# Patient Record
Sex: Female | Born: 1960
Health system: Southern US, Community
[De-identification: ages and names within clinical notes are randomized; demographics above are authoritative.]

## PROBLEM LIST (undated history)

## (undated) DIAGNOSIS — Z8669 Personal history of other diseases of the nervous system and sense organs: Secondary | ICD-10-CM

## (undated) DIAGNOSIS — Z8719 Personal history of other diseases of the digestive system: Secondary | ICD-10-CM

## (undated) DIAGNOSIS — Z8659 Personal history of other mental and behavioral disorders: Secondary | ICD-10-CM

## (undated) DIAGNOSIS — Z8701 Personal history of pneumonia (recurrent): Secondary | ICD-10-CM

## (undated) DIAGNOSIS — Z8639 Personal history of other endocrine, nutritional and metabolic disease: Secondary | ICD-10-CM

## (undated) HISTORY — DX: Personal history of other endocrine, nutritional and metabolic disease: Z86.39

## (undated) HISTORY — DX: Personal history of other mental and behavioral disorders: Z86.59

## (undated) HISTORY — DX: Personal history of other diseases of the digestive system: Z87.19

## (undated) HISTORY — PX: BREAST ENHANCEMENT SURGERY: SHX7

## (undated) HISTORY — DX: Personal history of other diseases of the nervous system and sense organs: Z86.69

## (undated) HISTORY — PX: DENTAL SURGERY: SHX609

## (undated) HISTORY — PX: LUMBAR DISC SURGERY: SHX700

## (undated) HISTORY — DX: Personal history of pneumonia (recurrent): Z87.01

---

## 1989-01-11 HISTORY — PX: AUGMENTATION MAMMAPLASTY: SUR837

## 2001-07-05 ENCOUNTER — Encounter: Payer: Self-pay | Admitting: Family Medicine

## 2003-01-12 DIAGNOSIS — Z8701 Personal history of pneumonia (recurrent): Secondary | ICD-10-CM

## 2003-01-12 HISTORY — DX: Personal history of pneumonia (recurrent): Z87.01

## 2003-06-10 ENCOUNTER — Emergency Department (HOSPITAL_COMMUNITY): Admission: EM | Admit: 2003-06-10 | Discharge: 2003-06-10 | Payer: Self-pay | Admitting: Family Medicine

## 2003-11-19 ENCOUNTER — Ambulatory Visit: Payer: Self-pay | Admitting: Family Medicine

## 2003-11-21 ENCOUNTER — Ambulatory Visit: Payer: Self-pay | Admitting: Family Medicine

## 2003-12-11 ENCOUNTER — Ambulatory Visit: Payer: Self-pay | Admitting: Family Medicine

## 2004-03-27 ENCOUNTER — Ambulatory Visit: Payer: Self-pay | Admitting: Family Medicine

## 2004-10-07 ENCOUNTER — Ambulatory Visit: Payer: Self-pay | Admitting: Family Medicine

## 2004-10-19 ENCOUNTER — Ambulatory Visit: Payer: Self-pay | Admitting: Gastroenterology

## 2004-10-20 ENCOUNTER — Ambulatory Visit: Payer: Self-pay | Admitting: Gastroenterology

## 2005-03-11 ENCOUNTER — Encounter (INDEPENDENT_AMBULATORY_CARE_PROVIDER_SITE_OTHER): Payer: Self-pay | Admitting: Internal Medicine

## 2005-03-11 HISTORY — PX: TONGUE SURGERY: SHX810

## 2005-03-11 LAB — CONVERTED CEMR LAB: Hgb A1c MFr Bld: 6.1 %

## 2005-03-18 ENCOUNTER — Emergency Department (HOSPITAL_COMMUNITY): Admission: EM | Admit: 2005-03-18 | Discharge: 2005-03-18 | Payer: Self-pay | Admitting: Family Medicine

## 2005-03-25 ENCOUNTER — Ambulatory Visit: Payer: Self-pay | Admitting: Family Medicine

## 2005-03-29 ENCOUNTER — Ambulatory Visit: Payer: Self-pay | Admitting: Family Medicine

## 2005-04-08 ENCOUNTER — Encounter (INDEPENDENT_AMBULATORY_CARE_PROVIDER_SITE_OTHER): Payer: Self-pay | Admitting: Specialist

## 2005-04-08 ENCOUNTER — Ambulatory Visit (HOSPITAL_BASED_OUTPATIENT_CLINIC_OR_DEPARTMENT_OTHER): Admission: RE | Admit: 2005-04-08 | Discharge: 2005-04-08 | Payer: Self-pay | Admitting: Otolaryngology

## 2005-04-13 ENCOUNTER — Ambulatory Visit: Payer: Self-pay | Admitting: Family Medicine

## 2005-07-12 ENCOUNTER — Ambulatory Visit: Payer: Self-pay | Admitting: Family Medicine

## 2005-10-20 ENCOUNTER — Ambulatory Visit: Payer: Self-pay | Admitting: Family Medicine

## 2006-01-11 ENCOUNTER — Encounter (INDEPENDENT_AMBULATORY_CARE_PROVIDER_SITE_OTHER): Payer: Self-pay | Admitting: Internal Medicine

## 2006-01-19 ENCOUNTER — Ambulatory Visit: Payer: Self-pay | Admitting: Family Medicine

## 2006-01-28 ENCOUNTER — Other Ambulatory Visit: Admission: RE | Admit: 2006-01-28 | Discharge: 2006-01-28 | Payer: Self-pay | Admitting: Family Medicine

## 2006-01-28 ENCOUNTER — Encounter (INDEPENDENT_AMBULATORY_CARE_PROVIDER_SITE_OTHER): Payer: Self-pay | Admitting: Specialist

## 2006-01-28 ENCOUNTER — Ambulatory Visit: Payer: Self-pay | Admitting: Family Medicine

## 2006-02-01 ENCOUNTER — Ambulatory Visit: Payer: Self-pay | Admitting: Family Medicine

## 2006-02-01 LAB — CONVERTED CEMR LAB
ALT: 23 units/L (ref 0–40)
Albumin: 3.6 g/dL (ref 3.5–5.2)
BUN: 12 mg/dL (ref 6–23)
CO2: 27 meq/L (ref 19–32)
Calcium: 8.9 mg/dL (ref 8.4–10.5)
Chloride: 107 meq/L (ref 96–112)
Cholesterol: 150 mg/dL (ref 0–200)
GFR calc Af Amer: 100 mL/min
HCT: 33.7 % — ABNORMAL LOW (ref 36.0–46.0)
MCHC: 34.5 g/dL (ref 30.0–36.0)
Platelets: 306 10*3/uL (ref 150–400)
Potassium: 4 meq/L (ref 3.5–5.1)
RBC: 4.14 M/uL (ref 3.87–5.11)
Total Protein: 6.2 g/dL (ref 6.0–8.3)
Triglycerides: 126 mg/dL (ref 0–149)
WBC: 7.1 10*3/uL (ref 4.5–10.5)

## 2006-02-02 ENCOUNTER — Ambulatory Visit (HOSPITAL_COMMUNITY): Admission: RE | Admit: 2006-02-02 | Discharge: 2006-02-02 | Payer: Self-pay | Admitting: Family Medicine

## 2006-07-06 ENCOUNTER — Encounter: Payer: Self-pay | Admitting: Internal Medicine

## 2006-07-06 DIAGNOSIS — E1165 Type 2 diabetes mellitus with hyperglycemia: Secondary | ICD-10-CM | POA: Insufficient documentation

## 2006-07-06 DIAGNOSIS — E785 Hyperlipidemia, unspecified: Secondary | ICD-10-CM

## 2006-07-06 DIAGNOSIS — K219 Gastro-esophageal reflux disease without esophagitis: Secondary | ICD-10-CM

## 2006-07-06 DIAGNOSIS — F329 Major depressive disorder, single episode, unspecified: Secondary | ICD-10-CM

## 2006-07-06 DIAGNOSIS — R739 Hyperglycemia, unspecified: Secondary | ICD-10-CM

## 2006-07-06 DIAGNOSIS — E1169 Type 2 diabetes mellitus with other specified complication: Secondary | ICD-10-CM | POA: Insufficient documentation

## 2006-07-06 DIAGNOSIS — F32A Depression, unspecified: Secondary | ICD-10-CM | POA: Insufficient documentation

## 2006-07-06 DIAGNOSIS — G43009 Migraine without aura, not intractable, without status migrainosus: Secondary | ICD-10-CM

## 2006-07-14 ENCOUNTER — Ambulatory Visit: Payer: Self-pay | Admitting: Family Medicine

## 2006-07-14 DIAGNOSIS — M545 Low back pain: Secondary | ICD-10-CM

## 2006-07-25 ENCOUNTER — Telehealth (INDEPENDENT_AMBULATORY_CARE_PROVIDER_SITE_OTHER): Payer: Self-pay | Admitting: Internal Medicine

## 2006-11-02 ENCOUNTER — Encounter (INDEPENDENT_AMBULATORY_CARE_PROVIDER_SITE_OTHER): Payer: Self-pay | Admitting: Internal Medicine

## 2006-11-21 ENCOUNTER — Emergency Department (HOSPITAL_COMMUNITY): Admission: EM | Admit: 2006-11-21 | Discharge: 2006-11-21 | Payer: Self-pay | Admitting: Emergency Medicine

## 2006-11-23 ENCOUNTER — Ambulatory Visit (HOSPITAL_COMMUNITY): Admission: RE | Admit: 2006-11-23 | Discharge: 2006-11-23 | Payer: Self-pay | Admitting: *Deleted

## 2006-11-25 ENCOUNTER — Ambulatory Visit (HOSPITAL_COMMUNITY): Admission: RE | Admit: 2006-11-25 | Discharge: 2006-11-25 | Payer: Self-pay | Admitting: *Deleted

## 2006-11-29 ENCOUNTER — Telehealth: Payer: Self-pay | Admitting: Family Medicine

## 2006-11-30 ENCOUNTER — Telehealth: Payer: Self-pay | Admitting: Family Medicine

## 2006-11-30 ENCOUNTER — Encounter: Admission: RE | Admit: 2006-11-30 | Discharge: 2006-11-30 | Payer: Self-pay | Admitting: *Deleted

## 2007-03-10 ENCOUNTER — Encounter (INDEPENDENT_AMBULATORY_CARE_PROVIDER_SITE_OTHER): Payer: Self-pay | Admitting: Internal Medicine

## 2007-04-10 ENCOUNTER — Encounter (INDEPENDENT_AMBULATORY_CARE_PROVIDER_SITE_OTHER): Payer: Self-pay | Admitting: Internal Medicine

## 2007-05-04 ENCOUNTER — Ambulatory Visit: Payer: Self-pay | Admitting: Family Medicine

## 2007-05-04 ENCOUNTER — Encounter (INDEPENDENT_AMBULATORY_CARE_PROVIDER_SITE_OTHER): Payer: Self-pay | Admitting: Internal Medicine

## 2007-05-05 LAB — CONVERTED CEMR LAB
Alkaline Phosphatase: 55 units/L (ref 39–117)
BUN: 13 mg/dL (ref 6–23)
Chloride: 105 meq/L (ref 96–112)
Creatinine, Ser: 0.9 mg/dL (ref 0.4–1.2)
Eosinophils Absolute: 0.2 10*3/uL (ref 0.0–0.7)
Eosinophils Relative: 2.2 % (ref 0.0–5.0)
Glucose, Bld: 80 mg/dL (ref 70–99)
HCT: 38.5 % (ref 36.0–46.0)
Hemoglobin: 12.6 g/dL (ref 12.0–15.0)
Neutro Abs: 4.5 10*3/uL (ref 1.4–7.7)
Platelets: 305 10*3/uL (ref 150–400)
Potassium: 3.9 meq/L (ref 3.5–5.1)
RBC: 4.4 M/uL (ref 3.87–5.11)
Sodium: 142 meq/L (ref 135–145)
Total Protein: 7 g/dL (ref 6.0–8.3)

## 2007-05-09 ENCOUNTER — Ambulatory Visit (HOSPITAL_COMMUNITY): Admission: RE | Admit: 2007-05-09 | Discharge: 2007-05-09 | Payer: Self-pay | Admitting: Internal Medicine

## 2007-05-12 ENCOUNTER — Telehealth (INDEPENDENT_AMBULATORY_CARE_PROVIDER_SITE_OTHER): Payer: Self-pay | Admitting: Internal Medicine

## 2007-05-16 ENCOUNTER — Encounter (INDEPENDENT_AMBULATORY_CARE_PROVIDER_SITE_OTHER): Payer: Self-pay | Admitting: Internal Medicine

## 2007-06-12 ENCOUNTER — Telehealth: Payer: Self-pay | Admitting: Family Medicine

## 2007-06-16 ENCOUNTER — Ambulatory Visit: Payer: Self-pay | Admitting: Family Medicine

## 2007-06-29 ENCOUNTER — Encounter: Payer: Self-pay | Admitting: Family Medicine

## 2007-07-05 ENCOUNTER — Telehealth (INDEPENDENT_AMBULATORY_CARE_PROVIDER_SITE_OTHER): Payer: Self-pay | Admitting: Internal Medicine

## 2007-07-17 ENCOUNTER — Encounter: Admission: RE | Admit: 2007-07-17 | Discharge: 2007-10-03 | Payer: Self-pay | Admitting: *Deleted

## 2007-07-26 ENCOUNTER — Encounter: Payer: Self-pay | Admitting: Family Medicine

## 2007-07-31 ENCOUNTER — Ambulatory Visit (HOSPITAL_COMMUNITY): Admission: RE | Admit: 2007-07-31 | Discharge: 2007-08-01 | Payer: Self-pay | Admitting: *Deleted

## 2007-07-31 ENCOUNTER — Encounter: Payer: Self-pay | Admitting: Family Medicine

## 2007-07-31 HISTORY — PX: LAPAROSCOPIC GASTRIC BANDING: SHX1100

## 2007-08-11 ENCOUNTER — Ambulatory Visit: Payer: Self-pay | Admitting: Family Medicine

## 2007-08-11 DIAGNOSIS — E039 Hypothyroidism, unspecified: Secondary | ICD-10-CM

## 2007-08-17 ENCOUNTER — Ambulatory Visit: Payer: Self-pay | Admitting: Family Medicine

## 2007-08-21 LAB — CONVERTED CEMR LAB
LDL Cholesterol: 106 mg/dL — ABNORMAL HIGH (ref 0–99)
TSH: 1.94 microintl units/mL (ref 0.35–5.50)
Total CHOL/HDL Ratio: 5.4
VLDL: 28 mg/dL (ref 0–40)

## 2007-09-04 ENCOUNTER — Telehealth (INDEPENDENT_AMBULATORY_CARE_PROVIDER_SITE_OTHER): Payer: Self-pay | Admitting: *Deleted

## 2007-09-06 ENCOUNTER — Encounter: Payer: Self-pay | Admitting: Family Medicine

## 2007-09-11 ENCOUNTER — Telehealth (INDEPENDENT_AMBULATORY_CARE_PROVIDER_SITE_OTHER): Payer: Self-pay | Admitting: *Deleted

## 2007-10-10 ENCOUNTER — Telehealth (INDEPENDENT_AMBULATORY_CARE_PROVIDER_SITE_OTHER): Payer: Self-pay | Admitting: *Deleted

## 2007-10-26 ENCOUNTER — Encounter: Admission: RE | Admit: 2007-10-26 | Discharge: 2007-10-26 | Payer: Self-pay | Admitting: *Deleted

## 2007-11-28 ENCOUNTER — Ambulatory Visit: Payer: Self-pay | Admitting: Family Medicine

## 2007-11-28 DIAGNOSIS — E669 Obesity, unspecified: Secondary | ICD-10-CM | POA: Insufficient documentation

## 2007-11-28 DIAGNOSIS — N920 Excessive and frequent menstruation with regular cycle: Secondary | ICD-10-CM

## 2007-12-14 ENCOUNTER — Encounter (INDEPENDENT_AMBULATORY_CARE_PROVIDER_SITE_OTHER): Payer: Self-pay | Admitting: *Deleted

## 2007-12-25 ENCOUNTER — Telehealth: Payer: Self-pay | Admitting: Family Medicine

## 2008-01-01 ENCOUNTER — Telehealth (INDEPENDENT_AMBULATORY_CARE_PROVIDER_SITE_OTHER): Payer: Self-pay | Admitting: *Deleted

## 2008-01-11 ENCOUNTER — Ambulatory Visit: Payer: Self-pay | Admitting: Family Medicine

## 2008-01-19 LAB — CONVERTED CEMR LAB
Cholesterol: 214 mg/dL (ref 0–200)
Direct LDL: 164.3 mg/dL
HDL: 29.2 mg/dL — ABNORMAL LOW (ref >39.0)
TSH: 1.46 microintl units/mL (ref 0.35–5.50)
Total CHOL/HDL Ratio: 7.3
Triglycerides: 74 mg/dL (ref 0–149)
VLDL: 15 mg/dL (ref 0–40)

## 2008-04-25 ENCOUNTER — Telehealth: Payer: Self-pay | Admitting: Family Medicine

## 2008-09-06 ENCOUNTER — Encounter: Payer: Self-pay | Admitting: Family Medicine

## 2008-09-06 ENCOUNTER — Emergency Department: Payer: Self-pay | Admitting: Emergency Medicine

## 2008-09-10 ENCOUNTER — Telehealth: Payer: Self-pay | Admitting: Family Medicine

## 2008-09-12 ENCOUNTER — Telehealth: Payer: Self-pay | Admitting: Family Medicine

## 2008-09-18 ENCOUNTER — Ambulatory Visit: Payer: Self-pay | Admitting: Family Medicine

## 2008-09-18 DIAGNOSIS — N912 Amenorrhea, unspecified: Secondary | ICD-10-CM | POA: Insufficient documentation

## 2008-09-18 DIAGNOSIS — L293 Anogenital pruritus, unspecified: Secondary | ICD-10-CM

## 2008-09-18 DIAGNOSIS — R42 Dizziness and giddiness: Secondary | ICD-10-CM

## 2008-09-23 ENCOUNTER — Telehealth: Payer: Self-pay | Admitting: Family Medicine

## 2009-01-04 ENCOUNTER — Emergency Department: Payer: Self-pay | Admitting: Emergency Medicine

## 2009-02-02 ENCOUNTER — Emergency Department (HOSPITAL_COMMUNITY): Admission: EM | Admit: 2009-02-02 | Discharge: 2009-02-02 | Payer: Self-pay | Admitting: Emergency Medicine

## 2009-02-03 ENCOUNTER — Telehealth: Payer: Self-pay | Admitting: Family Medicine

## 2009-05-26 ENCOUNTER — Telehealth: Payer: Self-pay | Admitting: Family Medicine

## 2010-01-13 ENCOUNTER — Telehealth: Payer: Self-pay | Admitting: Family Medicine

## 2010-02-08 LAB — CONVERTED CEMR LAB
FSH: 47.5 milliintl units/mL
LH: 29.27 milliintl units/mL
TSH: 1.32 microintl units/mL (ref 0.35–5.50)

## 2010-02-10 NOTE — Progress Notes (Signed)
Summary: sumatriptan succ  Phone Note Refill Request Message from:  Fax from Pharmacy on February 03, 2009 9:34 AM  Refills Requested: Medication #1:  IMITREX 100 MG  TABS 1 by mouth once daily as needed for migraine   Supply Requested: 1 month   Last Ref[facility]: [phone]   Method Requested: Electronic Initial call taken by: Benny Lennert CMA Duncan Dull),  February 03, 2009 9:34 AM  Follow-up for Phone Call        px written on EMR for call in  Follow-up by: Judith Part MD,  February 03, 2009 10:27 AM  Additional Follow-up for Phone Call Additional follow up Details #1::        [facility] Additional Follow-up by: Lowella Petties CMA,  February 03, 2009 10:47 AM    Prescriptions: IMITREX 100 MG  TABS (SUMATRIPTAN SUCCINATE) 1 by mouth once daily as needed for migraine  #12 x 1   Entered and Authorized by:   Judith Part MD   Signed by:   Lowella Petties CMA on 02/03/2009   Method used:   Telephoned to ...       [facility] (retail)       [address]       [place], [place]  [address]       Ph: [phone] or [phone]       Fax: [phone]   RxID:   [id number]

## 2010-02-10 NOTE — Progress Notes (Signed)
Summary: Sumatriptan succ 100mg  refill  Phone Note Refill Request Call back at [phone] Message from:  [facility] on May 26, 2009 12:01 PM  Refills Requested: Medication #1:  IMITREX 100 MG  TABS 1 by mouth once daily as needed for migraine [facility] request refill on Sumatriptan succ 100mg  no date sent last refill.Please advise.    Method Requested: Telephone to Pharmacy Initial call taken by: Lewanda Rife LPN,  May 26, 2009 12:02 PM  Follow-up for Phone Call        px written on EMR for call in  Follow-up by: Judith Part MD,  May 26, 2009 12:43 PM  Additional Follow-up for Phone Call Additional follow up Details #1::        Medication [facility] as instructed. Lewanda Rife LPN  May 26, 2009 4:23 PM     New/Updated Medications: IMITREX 100 MG  TABS (SUMATRIPTAN SUCCINATE) 1 by mouth once daily as needed for migraine Prescriptions: IMITREX 100 MG  TABS (SUMATRIPTAN SUCCINATE) 1 by mouth once daily as needed for migraine  #12 x 3   Entered and Authorized by:   Judith Part MD   Signed by:   Lewanda Rife LPN on 11/91/4782   Method used:   Telephoned to ...       [facility] (retail)       [address]       [place], [place]  [address]       Ph: [phone] or [phone]       Fax: [phone]   RxID:   [id number]

## 2010-02-12 NOTE — Progress Notes (Signed)
Summary:   LEXAPRO  Phone Note Refill Request Message from:  [phone] on January 13, 2010 4:43 PM  Refills Requested: Medication #1:  LEXAPRO 20 MG TABS Take 1 tablet by mouth once a day   Last Refilled: 11/11/2009 [facility] Request    Method Requested: Telephone to Pharmacy Initial call taken by: Mervin Hack CMA Duncan Dull),  January 13, 2010 4:45 PM  Follow-up for Phone Call        px written on EMR for call in  Follow-up by: Judith Part MD,  January 13, 2010 4:54 PM  Additional Follow-up for Phone Call Additional follow up Details #1::        [facility] as instructed. Lewanda Rife LPN  January 13, 2010 4:56 PM     New/Updated Medications: LEXAPRO 20 MG TABS (ESCITALOPRAM OXALATE) Take 1 tablet by mouth once a day Prescriptions: LEXAPRO 20 MG TABS (ESCITALOPRAM OXALATE) Take 1 tablet by mouth once a day  #30 x 5   Entered and Authorized by:   Judith Part MD   Signed by:   Lewanda Rife LPN on 84/16/6063   Method used:   Telephoned to ...       [facility] (retail)       [address]       [place], [place]  [address]       Ph: [phone] or [phone]       Fax: [phone]   RxID:   [id number]

## 2010-02-20 ENCOUNTER — Telehealth: Payer: Self-pay | Admitting: Family Medicine

## 2010-02-25 ENCOUNTER — Inpatient Hospital Stay (INDEPENDENT_AMBULATORY_CARE_PROVIDER_SITE_OTHER)
Admission: RE | Admit: 2010-02-25 | Discharge: 2010-02-25 | Disposition: A | Discharge status: Home / Self Care | Payer: Commercial Managed Care - PPO | Source: Ambulatory Visit | Attending: Emergency Medicine | Admitting: Emergency Medicine

## 2010-02-25 DIAGNOSIS — R079 Chest pain, unspecified: Secondary | ICD-10-CM

## 2010-02-26 NOTE — Progress Notes (Signed)
Summary: Sumatriptan Succ 100mg   Phone Note Refill Request Call back at [phone] Message from:  [facility] on February 20, 2010 4:41 PM  Refills Requested: Medication #1:  IMITREX 100 MG  TABS 1 by mouth once daily as needed for migraine   Last Refilled: 12/26/2009 Electronic refill request for Sumatriptan succ 100mg . last filled 12/26/09. Last Office note I could see was09/08/2008.Please advise.    Method Requested: Telephone to Pharmacy Initial call taken by: Lewanda Rife LPN,  February 20, 2010 4:42 PM  Follow-up for Phone Call        need to sched f/u this spring px written on EMR for call in  Follow-up by: Judith Part MD,  February 20, 2010 5:17 PM  Additional Follow-up for Phone Call Additional follow up Details #1::        [facility] as instructed. Note added to rx for pt to call for f/u this spring.Lewanda Rife LPN  February 20, 2010 5:30 PM     Prescriptions: IMITREX 100 MG  TABS (SUMATRIPTAN SUCCINATE) 1 by mouth once daily as needed for migraine  #12 x 3   Entered and Authorized by:   Judith Part MD   Signed by:   Judith Part MD on 02/20/2010   Method used:   Telephoned to ...       [facility] (retail)       [address]       [place], [place]  [address]       Ph: [phone] or [phone]       Fax: [phone]   RxID:   [id number]

## 2010-05-22 ENCOUNTER — Other Ambulatory Visit: Payer: Self-pay | Admitting: *Deleted

## 2010-05-22 MED ORDER — SUMATRIPTAN SUCCINATE 100 MG PO TABS: 100.0000 mg | ORAL_TABLET | ORAL | Status: DC | PRN

## 2010-05-22 NOTE — Telephone Encounter (Signed)
Printed in error. Reordered and sent electronically.

## 2010-05-26 NOTE — Op Note (Signed)
NAMEPIPPA, Evelyn Shepherd                ACCOUNT NO.:  [id number]   MEDICAL RECORD NO.:  [id number]          PATIENT TYPE:  OIB   LOCATION:  1531                         FACILITY:  [facility]   PHYSICIAN:  Alfonse Ras, MD   DATE OF BIRTH:  1960/09/09   DATE OF PROCEDURE:  DATE OF DISCHARGE:                               OPERATIVE REPORT   PREOPERATIVE DIAGNOSIS:  Medically refractory and morbid obesity.   POSTOPERATIVE DIAGNOSIS:  Medically refractory and morbid obesity.   PROCEDURE:  Laparoscopic adjustable gastric banding with the Allergan  APS system.   ANESTHESIA:  General.   SURGEON:  Alfonse Ras, M.D.   ASSISTANTThornton Park. Daphine Deutscher, M.D.   DESCRIPTION:  The patient was taken to the operating room, placed in  supine position after adequate general anesthesia was induced using  endotracheal tube the abdomen was prepped and draped in normal sterile  fashion.  Using 11 mm trocar in the left upper abdomen, direct  peritoneal access was obtained.  Under direct vision a 15 mm and 11 mm  trocars were placed in the right upper quadrant and additional 11-mm  camera port was placed in the left paramedian position.  A 5 mm trocar  was placed in subxiphoid region and Nathanson liver retractor was  introduced.  The left lateral segment of the liver was retracted  anteriorly.  Additional 5-mm port was placed in the right abdomen.  Initially performed a sharp and blunt dissection of the angle of His.  Then turned my attention to the right crus of the diaphragm.  The  peritoneum was incised over it and it was dissected posteriorly.  Both  the right and left crus could be visualized.  The sizing balloon was put  down and insufflated with 15 mL of air and pulled back.  There was no  evidence of hernia.  I did not see a hernia defect.  There was no  dimpling of the anterior peritoneum either.  I then found the area of  striking fat on the right diaphragm and passed the band passer in the  retrogastric position and brought it out at the angle of His.  An APS  system was then introduced and pulled around the stomach and closed and  placed over the sizing balloon. It moved easily.  I then did three  interrupted fundoplication sutures  with 2-0 Ethilon from the stomach to  the proximal pouch.  Adequate hemostasis was assured.  The band was in  good position.  The [facility] liver retractor was removed.  Tubing was  brought out through the 11 mm right sided trocar.  Pneumoperitoneum was  released.  All trocars were removed.  The tubing was then affixed to the  port and the  port to the anterior abdominal fascia, interrupted 2-0 Prolene.  Tissues  were injected with 0.5 Marcaine.  Skin incisions were closed with  staples.  Sterile dressings were applied.  The patient tolerated the  procedure well, went to PACU in good condition.      Alfonse Ras, MD  Electronically Signed  KRE/MEDQ  D:  07/31/2007  T:  08/01/2007  Job:  [id number]   cc:   Marne A. Tower, MD  [address] [place], [place] [address]

## 2010-05-29 NOTE — Op Note (Signed)
NAMEJOSLYNNE, KLATT                ACCOUNT NO.:  [id number]   MEDICAL RECORD NO.:  [id number]          PATIENT TYPE:  AMB   LOCATION:  DSC                          FACILITY:  [facility]   PHYSICIAN:  Suzanna Obey, M.D.       DATE OF BIRTH:  [birth year]   DATE OF PROCEDURE:  04/08/2005  DATE OF DISCHARGE:                                 OPERATIVE REPORT   PREOPERATIVE DIAGNOSIS:  Left tongue lesion.   POSTOPERATIVE DIAGNOSIS:  Left tongue lesion.   OPERATION PERFORMED:  Excision of left tongue mass.   SURGEON:  Suzanna Obey, M.D.   ANESTHESIA:  General.   ESTIMATED BLOOD LOSS:  Less than 5 mL.   INDICATIONS FOR PROCEDURE:  This is a 50 year old who has had repetitive  problems with a lesion on the left side of her tongue.  It has been there  for over two months.  It has caused her pain and discomfort.  It has been an  ulceration area.  She was informed of the risks and benefits of the  procedure including bleeding, infection, scarring, dysarthria, persistent  pain, and dysphagia and risks of the anesthetic.  All questions were  answered and consent was obtained.   DESCRIPTION OF PROCEDURE:  The patient was taken to the operating room and  placed in supine position after adequate general endotracheal tube  anesthesia, was placed in supine position and draped in the usual sterile  manner.  An elliptical incision was made around the left lateral lesion and  the lesion was removed with electrocautery.  It was then closed with  interrupted 3-0 Vicryl.  The wound looked very nice at closure with good  hemostasis.  The patient was then awakened and brought to recovery in stable  condition.  Counts correct.           ______________________________  Suzanna Obey, M.D.     JB/MEDQ  D:  04/08/2005  T:  04/09/2005  Job:  [id number]   cc:   Marne A. Tower, M.D. [facility]  [address] [place]  [place] [address]

## 2010-08-06 ENCOUNTER — Inpatient Hospital Stay (INDEPENDENT_AMBULATORY_CARE_PROVIDER_SITE_OTHER)
Admission: RE | Admit: 2010-08-06 | Discharge: 2010-08-06 | Disposition: A | Discharge status: Home / Self Care | Payer: 59 | Source: Ambulatory Visit | Attending: Family Medicine | Admitting: Family Medicine

## 2010-08-06 DIAGNOSIS — J069 Acute upper respiratory infection, unspecified: Secondary | ICD-10-CM

## 2010-08-06 DIAGNOSIS — N390 Urinary tract infection, site not specified: Secondary | ICD-10-CM

## 2010-08-06 LAB — POCT URINALYSIS DIP (DEVICE)
Bilirubin Urine: NEGATIVE
Ketones, ur: NEGATIVE mg/dL
pH: 6.5 (ref 5.0–8.0)

## 2010-10-09 LAB — CBC
MCV: 87
RBC: 4.24

## 2010-10-09 LAB — DIFFERENTIAL
Basophils Relative: 0
Lymphs Abs: 0.9
Monocytes Relative: 4
Neutro Abs: 7.3
Neutrophils Relative %: 85 — ABNORMAL HIGH

## 2010-10-09 LAB — HEMOGLOBIN AND HEMATOCRIT, BLOOD: HCT: 38.6

## 2010-10-09 LAB — GLUCOSE, CAPILLARY

## 2010-12-25 ENCOUNTER — Other Ambulatory Visit: Payer: Self-pay | Admitting: Family Medicine

## 2010-12-25 NOTE — Telephone Encounter (Signed)
rx called into pharmacy

## 2010-12-25 NOTE — Telephone Encounter (Signed)
Is ok -- has appt with me in march Px written for call in  Since note did not specify what pharmacy

## 2010-12-25 NOTE — Telephone Encounter (Signed)
Patient requesting refill on Lexapro 20mg .

## 2010-12-25 NOTE — Telephone Encounter (Signed)
OK to refill? Not on current med list and patient has not been seen in well over 1 year.

## 2011-02-08 ENCOUNTER — Other Ambulatory Visit: Payer: Self-pay | Admitting: Family Medicine

## 2011-02-11 ENCOUNTER — Telehealth (INDEPENDENT_AMBULATORY_CARE_PROVIDER_SITE_OTHER): Payer: Self-pay | Admitting: Surgery

## 2011-02-11 NOTE — Telephone Encounter (Signed)
02/11/11 recall letter mailed to patient for bariatric surgery follow-up. Adv pt to call our office at [phone] to schedule an appointment. CEF °

## 2011-02-12 ENCOUNTER — Other Ambulatory Visit: Payer: Self-pay | Admitting: Family Medicine

## 2011-02-12 NOTE — Telephone Encounter (Signed)
Will refill electronically  

## 2011-03-25 ENCOUNTER — Telehealth (INDEPENDENT_AMBULATORY_CARE_PROVIDER_SITE_OTHER): Payer: 59 | Admitting: Family Medicine

## 2011-03-25 ENCOUNTER — Telehealth: Payer: Self-pay | Admitting: Family Medicine

## 2011-03-25 ENCOUNTER — Other Ambulatory Visit (INDEPENDENT_AMBULATORY_CARE_PROVIDER_SITE_OTHER): Payer: 59

## 2011-03-25 DIAGNOSIS — E785 Hyperlipidemia, unspecified: Secondary | ICD-10-CM

## 2011-03-25 DIAGNOSIS — E119 Type 2 diabetes mellitus without complications: Secondary | ICD-10-CM

## 2011-03-25 DIAGNOSIS — E039 Hypothyroidism, unspecified: Secondary | ICD-10-CM

## 2011-03-25 DIAGNOSIS — Z0000 Encounter for general adult medical examination without abnormal findings: Secondary | ICD-10-CM | POA: Insufficient documentation

## 2011-03-25 LAB — COMPREHENSIVE METABOLIC PANEL
Albumin: 4.2 g/dL (ref 3.5–5.2)
Alkaline Phosphatase: 70 U/L (ref 39–117)
BUN: 15 mg/dL (ref 6–23)
CO2: 28 mEq/L (ref 19–32)
Calcium: 9.2 mg/dL (ref 8.4–10.5)
GFR: 75.09 mL/min (ref >60.00)
Glucose, Bld: 98 mg/dL (ref 70–99)
Potassium: 4.3 mEq/L (ref 3.5–5.1)

## 2011-03-25 LAB — CBC WITH DIFFERENTIAL/PLATELET
Basophils Relative: 0.6 % (ref 0.0–3.0)
Eosinophils Relative: 2.6 % (ref 0.0–5.0)
HCT: 37.3 % (ref 36.0–46.0)
MCV: 88 fl (ref 78.0–100.0)
Monocytes Absolute: 0.3 10*3/uL (ref 0.1–1.0)
Monocytes Relative: 5.4 % (ref 3.0–12.0)
Neutrophils Relative %: 39.9 % — ABNORMAL LOW (ref 43.0–77.0)
RBC: 4.24 Mil/uL (ref 3.87–5.11)
WBC: 4.9 10*3/uL (ref 4.5–10.5)

## 2011-03-25 LAB — TSH: TSH: 2.54 u[IU]/mL (ref 0.35–5.50)

## 2011-03-25 LAB — LIPID PANEL
Cholesterol: 233 mg/dL — ABNORMAL HIGH (ref 0–200)
VLDL: 20.8 mg/dL (ref 0.0–40.0)

## 2011-03-25 LAB — LDL CHOLESTEROL, DIRECT: Direct LDL: 157.7 mg/dL

## 2011-03-25 NOTE — Telephone Encounter (Signed)
Message copied by Judy Pimple on Thu Mar 25, 2011  7:51 AM ------      Message from: Alvina Chou      Created: Mon Mar 22, 2011 11:06 AM      Regarding: labs for 03-25-11       Patient is scheduled for CPX labs, please order future labs, Thanks , Camelia Eng

## 2011-03-25 NOTE — Telephone Encounter (Signed)
Message copied by Judy Pimple on Thu Mar 25, 2011  7:49 AM ------      Message from: Baldomero Lamy      Created: Mon Mar 22, 2011 11:59 AM      Regarding: Cpx labs Thurs 3/14       Please order  future cpx labs for pt's upcomming lab appt.      Thanks      Rodney Booze

## 2011-03-29 ENCOUNTER — Encounter: Payer: 59 | Admitting: Family Medicine

## 2011-03-30 ENCOUNTER — Encounter: Payer: 59 | Admitting: Family Medicine

## 2011-04-13 ENCOUNTER — Encounter: Payer: Self-pay | Admitting: Internal Medicine

## 2011-04-13 ENCOUNTER — Ambulatory Visit (INDEPENDENT_AMBULATORY_CARE_PROVIDER_SITE_OTHER): Payer: 59 | Admitting: Family Medicine

## 2011-04-13 ENCOUNTER — Encounter: Payer: Self-pay | Admitting: Family Medicine

## 2011-04-13 VITALS — BP 112/70 | HR 73 | Temp 97.8°F | Ht 66.0 in | Wt 190.8 lb

## 2011-04-13 DIAGNOSIS — Z0000 Encounter for general adult medical examination without abnormal findings: Secondary | ICD-10-CM

## 2011-04-13 DIAGNOSIS — R300 Dysuria: Secondary | ICD-10-CM

## 2011-04-13 DIAGNOSIS — Z1239 Encounter for other screening for malignant neoplasm of breast: Secondary | ICD-10-CM | POA: Insufficient documentation

## 2011-04-13 DIAGNOSIS — R7309 Other abnormal glucose: Secondary | ICD-10-CM

## 2011-04-13 DIAGNOSIS — L219 Seborrheic dermatitis, unspecified: Secondary | ICD-10-CM

## 2011-04-13 DIAGNOSIS — E039 Hypothyroidism, unspecified: Secondary | ICD-10-CM

## 2011-04-13 DIAGNOSIS — R739 Hyperglycemia, unspecified: Secondary | ICD-10-CM

## 2011-04-13 DIAGNOSIS — Z1211 Encounter for screening for malignant neoplasm of colon: Secondary | ICD-10-CM | POA: Insufficient documentation

## 2011-04-13 DIAGNOSIS — E785 Hyperlipidemia, unspecified: Secondary | ICD-10-CM

## 2011-04-13 DIAGNOSIS — N390 Urinary tract infection, site not specified: Secondary | ICD-10-CM

## 2011-04-13 DIAGNOSIS — E669 Obesity, unspecified: Secondary | ICD-10-CM

## 2011-04-13 DIAGNOSIS — Z1231 Encounter for screening mammogram for malignant neoplasm of breast: Secondary | ICD-10-CM

## 2011-04-13 LAB — POCT URINALYSIS DIPSTICK
Bilirubin, UA: NEGATIVE
Glucose, UA: NEGATIVE
Ketones, UA: NEGATIVE
Nitrite, UA: POSITIVE

## 2011-04-13 MED ORDER — KETOCONAZOLE 2 % EX CREA: TOPICAL_CREAM | Freq: Every day | CUTANEOUS | Status: DC

## 2011-04-13 MED ORDER — CIPROFLOXACIN HCL 250 MG PO TABS: 250.0000 mg | ORAL_TABLET | Freq: Two times a day (BID) | ORAL | Status: AC

## 2011-04-13 MED ORDER — SUMATRIPTAN SUCCINATE 100 MG PO TABS: 100.0000 mg | ORAL_TABLET | Freq: Every day | ORAL | Status: DC | PRN

## 2011-04-13 MED ORDER — ESCITALOPRAM OXALATE 20 MG PO TABS: 20.0000 mg | ORAL_TABLET | Freq: Every day | ORAL | Status: DC

## 2011-04-13 NOTE — Progress Notes (Signed)
Subjective:    Patient ID: Evelyn Shepherd, female    DOB: [birth year], 51 y.o.   MRN: [id number]  HPI Here for health maintenance exam and to review chronic medical problems   Feels generally   Has a uti  Last one was 2 years ago  Needs a cx   Is very tired and lacking motivation  Also aches a lot - more than she used to , more aches and pains  Especially after she works  ? Menopausal Also trouble sleeping  Does not think she is getting more depressed  lexapro is working   Face is getting really itchy and also eyebrow area  Also peeling skin on her brows -- skin peels , also middle of forehead/ nose and the edge of hairlie    Wt is up 2 lb with bmi of 30  112/70 good bp  Hypothyroid Lab Results  Component Value Date   TSH 2.54 03/25/2011   no symptoms - is feeling stable   Lipids diet controlled Lab Results  Component Value Date   CHOL 233* 03/25/2011   CHOL 214* 01/11/2008   CHOL 165 08/17/2007   Lab Results  Component Value Date   HDL 50.90 03/25/2011   HDL 40.9* 01/11/2008   HDL 30.3* 08/17/2007   Lab Results  Component Value Date   LDLCALC 106* 08/17/2007   LDLCALC 83 02/01/2006   Lab Results  Component Value Date   TRIG 104.0 03/25/2011   TRIG 74 01/11/2008   TRIG 142 08/17/2007   Lab Results  Component Value Date   CHOLHDL 5 03/25/2011   CHOLHDL 7.3 CALC 01/11/2008   CHOLHDL 5.4 CALC 08/17/2007   Lab Results  Component Value Date   LDLDIRECT 157.7 03/25/2011   LDLDIRECT 164.3 01/11/2008   does not pay much attention to cholesterol in diet  Uses low fat cheeses and lots of salads - tries to stay low fat  Does not eat beef Mostly chicken  Very rarely eats fried foods   Exercise -none now at all  Is not motivated to start  Very little energy -stays tired  Does what she has to do   DM - a1c is 5.7- good  Stays away from sweets - but occ eats them   ua pos today- symptoms for 2 months , bad odor in urine and cloudy too  Started after intercourse  Is  drinking lots of water - but needs to drink more  Did quit drinking tea , however   Pap/ gyn exam Has been just over a year ago 1/12 -- saw gyn for heavy bleeding in past  Now no bleeding in past 1-2 years  No abn paps in many years-- over 20  No gyn symptoms   Td- last 5 years ago  Flu shot- got it in the fall   mammo- was 1/12 Needs to schedule - last one was in gyn office  Wants to go to [place]  Self exam- no lumps or changes   Colon screen Is ready to get her first colonoscopy   Mother recently dx with kidney cancer   Patient Active Problem List  Diagnoses  . HYPOTHYROIDISM  . DIABETES MELLITUS, TYPE II  . HYPERLIPIDEMIA  . OBESITY  . OBESITY, MORBID  . DEPRESSION  . COMMON MIGRAINE  . GERD  . AMENORRHEA  . MENORRHAGIA  . VAGINAL PRURITUS  . LOW BACK PAIN, CHRONIC  . VERTIGO  . Routine general medical examination at a health care facility  .  Other screening mammogram  . UTI (lower urinary tract infection)  . Special screening for malignant neoplasms, colon  . Seborrheic dermatitis   No past medical history on file. No past surgical history on file. History  Substance Use Topics  . Smoking status: Never Smoker   . Smokeless tobacco: Not on file  . Alcohol Use: Not on file   Family History  Problem Relation Age of Onset  . Kidney cancer Mother    Allergies  Allergen Reactions  . Meloxicam     REACTION: unspecified  . Sulfonamide Derivatives     REACTION: unspecified   Current Outpatient Prescriptions on File Prior to Visit  Medication Sig Dispense Refill  . escitalopram (LEXAPRO) 20 MG tablet Take 1 tablet (20 mg total) by mouth daily.  30 tablet  11  . SUMAtriptan (IMITREX) 100 MG tablet Take 1 tablet (100 mg total) by mouth daily as needed for migraine.  12 tablet  11        Review of Systems Review of Systems  Constitutional: Negative for fever, appetite change, fatigue and unexpected weight change.  Eyes: Negative for pain and visual  disturbance.  Respiratory: Negative for cough and shortness of breath.   Cardiovascular: Negative for cp or palpitations    Gastrointestinal: Negative for nausea, diarrhea and constipation.  Genitourinary: pos  for urgency and frequency. neg for menstrual bleeding  Skin: Negative for pallor and pos for rash on face with peeling Neurological: Negative for weakness, light-headedness, numbness and headaches.  Hematological: Negative for adenopathy. Does not bruise/bleed easily.  Psychiatric/Behavioral: Negative for dysphoric mood. The patient is not nervous/anxious.          Objective:   Physical Exam  Constitutional: She appears well-developed and well-nourished. No distress.       overwt and well appearing   HENT:  Head: Normocephalic and atraumatic.  Right Ear: External ear normal.  Left Ear: External ear normal.  Nose: Nose normal.  Mouth/Throat: Oropharynx is clear and moist. No oropharyngeal exudate.  Eyes: Conjunctivae and EOM are normal. Pupils are equal, round, and reactive to light. Right eye exhibits no discharge. Left eye exhibits no discharge. No scleral icterus.  Neck: Normal range of motion. Neck supple. No JVD present. Carotid bruit is not present. No thyromegaly present.  Cardiovascular: Normal rate, regular rhythm, normal heart sounds and intact distal pulses.  Exam reveals no gallop.   Pulmonary/Chest: Effort normal and breath sounds normal. No respiratory distress. She has no wheezes.  Abdominal: Soft. Bowel sounds are normal. She exhibits no distension, no abdominal bruit and no mass. There is no tenderness.  Musculoskeletal: She exhibits no edema and no tenderness.       No cva tenderness   Lymphadenopathy:    She has no cervical adenopathy.  Neurological: She is alert. She has normal reflexes. No cranial nerve deficit. She exhibits normal muscle tone. Coordination normal.  Skin: Skin is warm and dry. Rash noted. No erythema. No pallor.       Slight dryness in  eyebrow area and hairline consistent with seb derm  Psychiatric: She has a normal mood and affect.          Assessment & Plan:

## 2011-04-13 NOTE — Patient Instructions (Addendum)
We will refer you for mammogram and colonoscopy at check out  Try to think about an exercise program - like yoga  For uti -take cipro as directed (Update if not starting to improve in a week or if worsening ) and we call when your culture returns  For rash on face - seborrhea - use the cream (ketoconazole) daily to affected areas and update if not improving Avoid red meat/ fried foods/ egg yolks/ fatty breakfast meats/ butter, cheese and high fat dairy/ and shellfish   Schedule fasting lab and follow up in 6 months for cholesterol

## 2011-04-16 LAB — URINE CULTURE: Colony Count: >=100000

## 2011-04-17 NOTE — Assessment & Plan Note (Signed)
Very well controlled with a1c under 6 Enc low glycemic diet and exercise

## 2011-04-17 NOTE — Assessment & Plan Note (Signed)
Ref for first screening colonoscopy  No special risk factors or stool changes

## 2011-04-17 NOTE — Assessment & Plan Note (Signed)
Discussed how this problem influences overall health and the risks it imposes  Reviewed plan for weight loss with lower calorie diet (via better food choices and also portion control or program like weight watchers) and exercise building up to or more than 30 minutes 5 days per week including some aerobic activity    

## 2011-04-17 NOTE — Assessment & Plan Note (Signed)
On face/ mild Disc use of dandruff shampoo 1-2 times weekly for hair and hairline  Will try nizoral cream for aff areas of face and update

## 2011-04-17 NOTE — Assessment & Plan Note (Signed)
theraputic tsh with no clinical changes No change in medication

## 2011-04-17 NOTE — Assessment & Plan Note (Signed)
Reviewed health habits including diet and exercise and skin cancer prevention Also reviewed health mt list, fam hx and immunizations   Wellness labs reviewed  

## 2011-04-17 NOTE — Assessment & Plan Note (Signed)
Uncomplicated with pos ua tx with cipro cx Red flags disc to watch for  Disc fluid intaks -will inc this

## 2011-04-17 NOTE — Assessment & Plan Note (Signed)
Need to work on lowering LDL  Disc goals for lipids and reasons to control them Rev labs with pt Rev low sat fat diet in detail  Will make plans for lifestyle change and re check

## 2011-04-17 NOTE — Assessment & Plan Note (Signed)
Scheduled annual screening mammogram Nl breast exam today  Encouraged monthly self exams   

## 2011-04-30 ENCOUNTER — Telehealth: Payer: Self-pay | Admitting: *Deleted

## 2011-04-30 NOTE — Telephone Encounter (Signed)
No show for previsit. Home phone ? Of out of service.  Sent no show letter

## 2011-05-10 ENCOUNTER — Encounter: Payer: 59 | Admitting: Internal Medicine

## 2011-05-13 ENCOUNTER — Encounter: Payer: 59 | Admitting: Gastroenterology

## 2011-10-13 ENCOUNTER — Encounter: Payer: Self-pay | Admitting: Family Medicine

## 2011-10-15 ENCOUNTER — Ambulatory Visit: Payer: 59 | Admitting: Family Medicine

## 2011-10-22 ENCOUNTER — Encounter: Payer: Self-pay | Admitting: Family Medicine

## 2011-10-22 ENCOUNTER — Ambulatory Visit (INDEPENDENT_AMBULATORY_CARE_PROVIDER_SITE_OTHER): Payer: 59 | Admitting: Family Medicine

## 2011-10-22 VITALS — BP 104/62 | HR 66 | Temp 98.3°F | Ht 66.0 in | Wt 196.8 lb

## 2011-10-22 DIAGNOSIS — E785 Hyperlipidemia, unspecified: Secondary | ICD-10-CM

## 2011-10-22 DIAGNOSIS — Z23 Encounter for immunization: Secondary | ICD-10-CM

## 2011-10-22 DIAGNOSIS — M545 Low back pain: Secondary | ICD-10-CM

## 2011-10-22 LAB — LIPID PANEL: Cholesterol: 239 mg/dL — ABNORMAL HIGH (ref 0–200)

## 2011-10-22 MED ORDER — METAXALONE 800 MG PO TABS: 800.0000 mg | ORAL_TABLET | Freq: Three times a day (TID) | ORAL | Status: DC

## 2011-10-22 NOTE — Assessment & Plan Note (Signed)
Here for re check of chol Disc goals for LDL  Diet is bettter really hoping for improvement  Also enc regular exercise

## 2011-10-22 NOTE — Assessment & Plan Note (Signed)
Intermittent low and mid back muscular spasm Disc starting exercise Skelaxin refilled for use with caution

## 2011-10-22 NOTE — Progress Notes (Signed)
Subjective:    Patient ID: Evelyn Shepherd, female    DOB: 07-05-1960, 51 y.o.   MRN: [id number]  HPI Here for f/u of hyperlipidemia  Is feeling good except some pain in back on and off in between shoulder blades  Needs refil of skelaxin  Has been busy  No n/t or neuro symptoms  No regular exercise    Flu shot today  Wt is up 6 lb with bmi of 31  Lab Results  Component Value Date   CHOL 233* 03/25/2011   HDL 50.90 03/25/2011   LDLCALC 106* 08/17/2007   LDLDIRECT 157.7 03/25/2011   TRIG 104.0 03/25/2011   CHOLHDL 5 03/25/2011    Diet changes- has cut out her ice cream - 1 month since she has had any  Also cutting back mayo -and quit that  No fried foods No red meat  Eats grilled chicken  Is eating a lot of salads and green beans No southern breakfast   fam hx - mother has high chol  Patient Active Problem List  Diagnosis  . HYPOTHYROIDISM  . Hyperglycemia  . HYPERLIPIDEMIA  . OBESITY  . OBESITY, MORBID  . DEPRESSION  . COMMON MIGRAINE  . GERD  . AMENORRHEA  . MENORRHAGIA  . VAGINAL PRURITUS  . LOW BACK PAIN, CHRONIC  . VERTIGO  . Routine general medical examination at a health care facility  . Other screening mammogram  . UTI (lower urinary tract infection)  . Special screening for malignant neoplasms, colon  . Seborrheic dermatitis   Past Medical History  Diagnosis Date  . History of depression   . Diabetes mellitus     Type II, 3/07  . History of gastroesophageal reflux (GERD)   . History of hyperlipidemia   . History of migraine   . History of obesity     lap band surgery  . History of pneumonia 2005   Past Surgical History  Procedure Date  . Cesarean section   . Breast enhancement surgery   . Lumbar disc surgery   . Dental surgery   . Tongue surgery 03/2005    lesion removal  . Laparoscopic gastric banding 07/31/07   History  Substance Use Topics  . Smoking status: Never Smoker   . Smokeless tobacco: Not on file  . Alcohol Use: Yes   occasional   Family History  Problem Relation Age of Onset  . Kidney cancer Mother   . Pneumonia Father     died  . Lung cancer Father   . Depression Father   . Diabetes Mother   . Depression Brother     commited suicide  . Alcohol abuse Brother   . Early menopause Other     family   Allergies  Allergen Reactions  . Meloxicam     REACTION: unspecified  . Sulfonamide Derivatives     REACTION: unspecified   Current Outpatient Prescriptions on File Prior to Visit  Medication Sig Dispense Refill  . escitalopram (LEXAPRO) 20 MG tablet Take 1 tablet (20 mg total) by mouth daily.  30 tablet  11  . ketoconazole (NIZORAL) 2 % cream Apply topically daily. To areas of face that are affected  15 g  2  . SUMAtriptan (IMITREX) 100 MG tablet Take 1 tablet (100 mg total) by mouth daily as needed for migraine.  12 tablet  11      Review of Systems Review of Systems  Constitutional: Negative for fever, appetite change, fatigue and unexpected weight change.  Eyes: Negative for pain and visual disturbance.  Respiratory: Negative for cough and shortness of breath.   Cardiovascular: Negative for cp or palpitations    Gastrointestinal: Negative for nausea, diarrhea and constipation.  Genitourinary: Negative for urgency and frequency.  Skin: Negative for pallor or rash   MSK pos for intermittent low back and mid back pain worse with movement, neg for joint redness or swelling  Neurological: Negative for weakness, light-headedness, numbness and headaches.  Hematological: Negative for adenopathy. Does not bruise/bleed easily.  Psychiatric/Behavioral: Negative for dysphoric mood. The patient is not nervous/anxious.         Objective:   Physical Exam  Constitutional: She appears well-developed and well-nourished. No distress.  HENT:  Head: Normocephalic and atraumatic.  Mouth/Throat: Oropharynx is clear and moist.  Eyes: Conjunctivae normal and EOM are normal. Pupils are equal, round, and  reactive to light. Right eye exhibits no discharge. Left eye exhibits no discharge.  Neck: Normal range of motion. Neck supple. No JVD present. Carotid bruit is not present. No thyromegaly present.  Cardiovascular: Normal rate, regular rhythm, normal heart sounds and intact distal pulses.  Exam reveals no gallop.   Pulmonary/Chest: Effort normal and breath sounds normal. No respiratory distress. She has no wheezes.  Abdominal: Soft. Bowel sounds are normal. She exhibits no distension, no abdominal bruit and no mass. There is no tenderness.  Musculoskeletal: She exhibits no edema.       Thoracic back: She exhibits tenderness and spasm. She exhibits normal range of motion, no bony tenderness and no edema.       Lumbar back: She exhibits normal range of motion, no tenderness, no bony tenderness and no spasm.       Neg slr  Lymphadenopathy:    She has no cervical adenopathy.  Neurological: She is alert. She has normal strength and normal reflexes. She displays no atrophy. No cranial nerve deficit or sensory deficit. She exhibits normal muscle tone. Coordination normal.  Skin: Skin is warm and dry. No rash noted. No erythema. No pallor.  Psychiatric: She has a normal mood and affect.          Assessment & Plan:

## 2011-10-22 NOTE — Patient Instructions (Addendum)
Flu shot today  I will send the skelaxin to your pharmacy Follow up if back pain does not improve  Think about starting exercise regularly  Avoid red meat/ fried foods/ egg yolks/ fatty breakfast meats/ butter, cheese and high fat dairy/ and shellfish

## 2011-10-25 ENCOUNTER — Encounter: Payer: Self-pay | Admitting: Family Medicine

## 2011-11-02 ENCOUNTER — Telehealth: Payer: Self-pay | Admitting: *Deleted

## 2011-11-02 MED ORDER — ATORVASTATIN CALCIUM 10 MG PO TABS: 10.0000 mg | ORAL_TABLET | Freq: Every day | ORAL | Status: DC

## 2011-11-02 NOTE — Telephone Encounter (Signed)
Pt called re a response she had sent via mychart, she has not heard back from her email.  She is agreeable to starting a med for cholesterol, request it sent to [facility].

## 2011-11-02 NOTE — Telephone Encounter (Signed)
Notified pt Rx was sent in, pt wanted to wait till tomorrow to schedule fasting labs because she want to look at schedule at work

## 2011-11-02 NOTE — Telephone Encounter (Signed)
Thanks- tell her I am still figuring out my chart....  I sent px for generic lipitor  sched fasting lab 6 weeks please

## 2011-11-04 ENCOUNTER — Telehealth: Payer: Self-pay

## 2011-11-04 NOTE — Telephone Encounter (Signed)
Pt scheduled fasting labs for 12/17/11

## 2011-11-04 NOTE — Telephone Encounter (Signed)
Pt requested flu shot immunization with Lot # faxed to secure fax [phone]. Pt notified done.

## 2011-11-08 ENCOUNTER — Other Ambulatory Visit: Payer: 59

## 2011-12-17 ENCOUNTER — Telehealth: Payer: Self-pay | Admitting: Family Medicine

## 2011-12-17 ENCOUNTER — Other Ambulatory Visit (INDEPENDENT_AMBULATORY_CARE_PROVIDER_SITE_OTHER): Payer: 59

## 2011-12-17 DIAGNOSIS — E785 Hyperlipidemia, unspecified: Secondary | ICD-10-CM

## 2011-12-17 LAB — LIPID PANEL
Cholesterol: 154 mg/dL (ref 0–200)
HDL: 49 mg/dL (ref >39.00)
LDL Cholesterol: 88 mg/dL (ref 0–99)
Total CHOL/HDL Ratio: 3
Triglycerides: 83 mg/dL (ref 0.0–149.0)

## 2011-12-17 NOTE — Telephone Encounter (Signed)
Message copied by Judy Pimple on Fri Dec 17, 2011  7:45 AM ------      Message from: Alvina Chou      Created: Mon Dec 13, 2011  4:49 PM      Regarding: Lab orders for Friday,12.6.13       Fasting labs, Thanks, Camelia Eng

## 2011-12-20 ENCOUNTER — Encounter: Payer: Self-pay | Admitting: *Deleted

## 2012-01-28 ENCOUNTER — Telehealth (INDEPENDENT_AMBULATORY_CARE_PROVIDER_SITE_OTHER): Payer: Self-pay | Admitting: Surgery

## 2012-01-28 NOTE — Telephone Encounter (Signed)
01/28/12 left message and mailed recall letter for pt to call [phone] and schedule a bariatric surgery follow-up appt with any bariatric surgeon. Dr. Colin Benton did Lap Band sx 07/31/07. ls

## 2012-03-23 ENCOUNTER — Other Ambulatory Visit: Payer: Self-pay | Admitting: *Deleted

## 2012-03-23 MED ORDER — ATORVASTATIN CALCIUM 10 MG PO TABS: 10.0000 mg | ORAL_TABLET | Freq: Every day | ORAL | Status: DC

## 2012-03-24 ENCOUNTER — Other Ambulatory Visit: Payer: Self-pay | Admitting: *Deleted

## 2012-04-20 ENCOUNTER — Other Ambulatory Visit: Payer: Self-pay | Admitting: *Deleted

## 2012-04-20 NOTE — Telephone Encounter (Signed)
Yes, she can have 6 mo of refils, thanks

## 2012-04-20 NOTE — Telephone Encounter (Signed)
Received fax request saying pt xfered her Rx to [facility] and she needs a new Rx sent there, ok to refill?

## 2012-04-21 MED ORDER — SUMATRIPTAN SUCCINATE 100 MG PO TABS: 100.0000 mg | ORAL_TABLET | Freq: Every day | ORAL | Status: DC | PRN

## 2012-04-21 NOTE — Telephone Encounter (Signed)
done

## 2012-04-23 ENCOUNTER — Other Ambulatory Visit: Payer: Self-pay | Admitting: Family Medicine

## 2012-04-24 ENCOUNTER — Other Ambulatory Visit: Payer: Self-pay | Admitting: *Deleted

## 2012-04-24 NOTE — Telephone Encounter (Signed)
Please refil for 6 months-thanks 

## 2012-04-24 NOTE — Telephone Encounter (Signed)
Ok to refill 

## 2012-04-25 NOTE — Telephone Encounter (Signed)
done

## 2012-05-17 ENCOUNTER — Other Ambulatory Visit: Payer: Self-pay

## 2012-05-17 MED ORDER — ESCITALOPRAM OXALATE 20 MG PO TABS: ORAL_TABLET | ORAL | Status: DC

## 2012-05-17 NOTE — Telephone Encounter (Signed)
Pt left v/m requesting refill lexapro for 90 day supply to new pharmacy [facility] outpatient pharmacy. I called to cancel refills at [facility] and pharmacist said had already been transferred to [facility]. Pt advised can give # [phone] refill.spoke with donna at [facility] out[facility] pharmacy and gave # [phone] x [phone].

## 2012-08-16 ENCOUNTER — Other Ambulatory Visit: Payer: Self-pay | Admitting: *Deleted

## 2012-08-16 MED ORDER — KETOCONAZOLE 2 % EX CREA: TOPICAL_CREAM | Freq: Every day | CUTANEOUS | Status: AC

## 2012-08-16 NOTE — Telephone Encounter (Signed)
Please refill that 3 mo supply

## 2012-08-16 NOTE — Telephone Encounter (Signed)
done

## 2012-08-16 NOTE — Telephone Encounter (Signed)
Pt requested 3 month supply (60g)

## 2012-11-16 ENCOUNTER — Other Ambulatory Visit: Payer: Self-pay

## 2012-11-21 ENCOUNTER — Other Ambulatory Visit: Payer: Self-pay | Admitting: Family Medicine

## 2012-11-21 MED ORDER — ESCITALOPRAM OXALATE 20 MG PO TABS: ORAL_TABLET | ORAL | Status: DC

## 2012-11-21 MED ORDER — SUMATRIPTAN SUCCINATE 100 MG PO TABS: 100.0000 mg | ORAL_TABLET | Freq: Every day | ORAL | Status: DC | PRN

## 2012-11-21 NOTE — Telephone Encounter (Signed)
appt scheduled an meds refilled

## 2012-11-21 NOTE — Telephone Encounter (Signed)
Please f/u with me in 3-6 mo and refill meds until then

## 2012-11-21 NOTE — Telephone Encounter (Addendum)
Lexapro Last filled 08/16/1912.  Pt requesting 3 mo supply of Imitrex.

## 2013-02-13 ENCOUNTER — Encounter: Payer: Self-pay | Admitting: Family Medicine

## 2013-02-13 ENCOUNTER — Ambulatory Visit (INDEPENDENT_AMBULATORY_CARE_PROVIDER_SITE_OTHER): Payer: 59 | Admitting: Family Medicine

## 2013-02-13 VITALS — BP 126/62 | HR 75 | Temp 97.5°F | Ht 66.0 in | Wt 174.8 lb

## 2013-02-13 DIAGNOSIS — Z1211 Encounter for screening for malignant neoplasm of colon: Secondary | ICD-10-CM | POA: Insufficient documentation

## 2013-02-13 DIAGNOSIS — E039 Hypothyroidism, unspecified: Secondary | ICD-10-CM

## 2013-02-13 DIAGNOSIS — R252 Cramp and spasm: Secondary | ICD-10-CM | POA: Insufficient documentation

## 2013-02-13 DIAGNOSIS — R739 Hyperglycemia, unspecified: Secondary | ICD-10-CM

## 2013-02-13 DIAGNOSIS — R7309 Other abnormal glucose: Secondary | ICD-10-CM

## 2013-02-13 DIAGNOSIS — E785 Hyperlipidemia, unspecified: Secondary | ICD-10-CM

## 2013-02-13 DIAGNOSIS — J069 Acute upper respiratory infection, unspecified: Secondary | ICD-10-CM

## 2013-02-13 LAB — COMPREHENSIVE METABOLIC PANEL
ALT: 28 U/L (ref 0–35)
AST: 20 U/L (ref 0–37)
Albumin: 3.8 g/dL (ref 3.5–5.2)
Alkaline Phosphatase: 75 U/L (ref 39–117)
BUN: 13 mg/dL (ref 6–23)
CALCIUM: 8.7 mg/dL (ref 8.4–10.5)
CHLORIDE: 108 meq/L (ref 96–112)
CO2: 28 meq/L (ref 19–32)
CREATININE: 0.7 mg/dL (ref 0.4–1.2)
GFR: 88.84 mL/min (ref >60.00)
Glucose, Bld: 99 mg/dL (ref 70–99)
Potassium: 4.2 mEq/L (ref 3.5–5.1)
SODIUM: 140 meq/L (ref 135–145)
TOTAL PROTEIN: 6.7 g/dL (ref 6.0–8.3)
Total Bilirubin: 0.5 mg/dL (ref 0.3–1.2)

## 2013-02-13 LAB — CBC WITH DIFFERENTIAL/PLATELET
BASOS ABS: 0 10*3/uL (ref 0.0–0.1)
Basophils Relative: 0.6 % (ref 0.0–3.0)
Eosinophils Absolute: 0.1 10*3/uL (ref 0.0–0.7)
Eosinophils Relative: 2 % (ref 0.0–5.0)
HCT: 36.5 % (ref 36.0–46.0)
HEMOGLOBIN: 11.8 g/dL — AB (ref 12.0–15.0)
LYMPHS PCT: 34.4 % (ref 12.0–46.0)
Lymphs Abs: 2 10*3/uL (ref 0.7–4.0)
MCHC: 32.3 g/dL (ref 30.0–36.0)
MCV: 89.1 fl (ref 78.0–100.0)
MONOS PCT: 5.7 % (ref 3.0–12.0)
Monocytes Absolute: 0.3 10*3/uL (ref 0.1–1.0)
NEUTROS ABS: 3.3 10*3/uL (ref 1.4–7.7)
Neutrophils Relative %: 57.3 % (ref 43.0–77.0)
Platelets: 215 10*3/uL (ref 150.0–400.0)
RBC: 4.1 Mil/uL (ref 3.87–5.11)
RDW: 13.2 % (ref 11.5–14.6)
WBC: 5.8 10*3/uL (ref 4.5–10.5)

## 2013-02-13 LAB — LIPID PANEL
CHOL/HDL RATIO: 3
Cholesterol: 198 mg/dL (ref 0–200)
HDL: 62.1 mg/dL (ref >39.00)
LDL Cholesterol: 126 mg/dL — ABNORMAL HIGH (ref 0–99)
Triglycerides: 51 mg/dL (ref 0.0–149.0)
VLDL: 10.2 mg/dL (ref 0.0–40.0)

## 2013-02-13 LAB — HEMOGLOBIN A1C: HEMOGLOBIN A1C: 5.8 % (ref 4.6–6.5)

## 2013-02-13 LAB — TSH: TSH: 1.57 u[IU]/mL (ref 0.35–5.50)

## 2013-02-13 MED ORDER — ESCITALOPRAM OXALATE 20 MG PO TABS: ORAL_TABLET | ORAL | Status: DC

## 2013-02-13 MED ORDER — ATORVASTATIN CALCIUM 10 MG PO TABS: 10.0000 mg | ORAL_TABLET | Freq: Every day | ORAL | Status: DC

## 2013-02-13 NOTE — Progress Notes (Signed)
Subjective:    Patient ID: Evelyn Shepherd, female    DOB: [birth year], 53 y.o.   MRN: [id number]  HPI Here for f/u of chronic medical problems  Has had uri symptoms -  Since Friday -- ear and thoat pain  Tint of green nasal d/c  No fever  Stuffy - for a while  Cough every now and then - due to drainage   Also having leg and toe and thigh cramps - esp at night -not doing any stretching   No libido - she is on ssri and has gone through menopause    Wt is down 22 lb with bmi of 28 Has had gastric banding  She says it has gone up a bit too - gained some over xmas  Is otherwise keeping up her good habits -  Some exercise - 2 days per week   Hyperlipidemia - lipitor and diet No problems with med - and diet is good  Due for labs Lab Results  Component Value Date   CHOL 154 12/17/2011   HDL 49.00 12/17/2011   LDLCALC 88 12/17/2011   LDLDIRECT 187.8 10/22/2011   TRIG 83.0 12/17/2011   CHOLHDL 3 12/17/2011     Hx of hyperglycemia Thinks her sugar level is going to look good   lexapro is still working well - mood is good   Overdue for pap / gyn care  She would rather come here than gyn- is menopausal   Patient Active Problem List   Diagnosis Date Noted  . Colon cancer screening 02/13/2013  . Leg cramps 02/13/2013  . Viral URI 02/13/2013  . Other screening mammogram 04/13/2011  . Special screening for malignant neoplasms, colon 04/13/2011  . Seborrheic dermatitis 04/13/2011  . Routine general medical examination at a health care facility 03/25/2011  . AMENORRHEA 09/18/2008  . MENORRHAGIA 11/28/2007  . HYPOTHYROIDISM 08/11/2007  . LOW BACK PAIN, CHRONIC 07/14/2006  . Hyperglycemia 07/06/2006  . HYPERLIPIDEMIA 07/06/2006  . DEPRESSION 07/06/2006  . COMMON MIGRAINE 07/06/2006  . GERD 07/06/2006   Past Medical History  Diagnosis Date  . History of depression   . Diabetes mellitus     Type II, 3/07  . History of gastroesophageal reflux (GERD)   . History of  hyperlipidemia   . History of migraine   . History of obesity     lap band surgery  . History of pneumonia 2005   Past Surgical History  Procedure Laterality Date  . Cesarean section    . Breast enhancement surgery    . Lumbar disc surgery    . Dental surgery    . Tongue surgery  03/2005    lesion removal  . Laparoscopic gastric banding  07/31/07   History  Substance Use Topics  . Smoking status: Never Smoker   . Smokeless tobacco: Not on file  . Alcohol Use: Yes     Comment: occasional   Family History  Problem Relation Age of Onset  . Kidney cancer Mother   . Pneumonia Father     died  . Lung cancer Father   . Depression Father   . Diabetes Mother   . Depression Brother     commited suicide  . Alcohol abuse Brother   . Early menopause Other     family   Allergies  Allergen Reactions  . Meloxicam     REACTION: unspecified  . Sulfonamide Derivatives     REACTION: unspecified   Current Outpatient Prescriptions on File Prior to  Visit  Medication Sig Dispense Refill  . ketoconazole (NIZORAL) 2 % cream Apply topically daily. To areas of face that are affected  60 g  0  . metaxalone (SKELAXIN) 800 MG tablet Take 1 tablet (800 mg total) by mouth 3 (three) times daily. As needed for back pain caution of sedation  30 tablet  2  . SUMAtriptan (IMITREX) 100 MG tablet Take 1 tablet (100 mg total) by mouth daily as needed for migraine.  36 tablet  0   No current facility-administered medications on file prior to visit.    Review of Systems Review of Systems  Constitutional: Negative for fever, appetite change,  and unexpected weight change.  ENT pos for cong and rhinorrhea and drip  Eyes: Negative for pain and visual disturbance.  Respiratory: Negative for wheeze  and shortness of breath.   Cardiovascular: Negative for cp or palpitations    Gastrointestinal: Negative for nausea, diarrhea and constipation.  Genitourinary: Negative for urgency and frequency.  Skin:  Negative for pallor or rash   MSK pos for leg cremps esp at night  Neurological: Negative for weakness, light-headedness, numbness and headaches.  Hematological: Negative for adenopathy. Does not bruise/bleed easily.  Psychiatric/Behavioral: Negative for dysphoric mood. The patient is not nervous/anxious.         Objective:   Physical Exam  Constitutional: She appears well-developed and well-nourished. No distress.  overwt and well app  HENT:  Head: Normocephalic and atraumatic.  Right Ear: External ear normal.  Left Ear: External ear normal.  Mouth/Throat: Oropharynx is clear and moist. No oropharyngeal exudate.  Nares are injected and congested  Clear rhinorrhea No facial tenderness  Eyes: Conjunctivae and EOM are normal. Pupils are equal, round, and reactive to light. Right eye exhibits no discharge. Left eye exhibits no discharge.  Neck: Normal range of motion. Neck supple. No JVD present. Carotid bruit is not present. No thyromegaly present.  Cardiovascular: Normal rate, regular rhythm and intact distal pulses.  Exam reveals no gallop.   Pulmonary/Chest: Effort normal and breath sounds normal. No respiratory distress. She has no wheezes. She exhibits no tenderness.  Abdominal: Soft. Bowel sounds are normal. She exhibits no distension and no mass. There is no tenderness.  Musculoskeletal: She exhibits no edema and no tenderness.  No palp cords in legs Neg homan's sign  Lymphadenopathy:    She has no cervical adenopathy.  Neurological: She is alert. She has normal reflexes. No cranial nerve deficit. She exhibits normal muscle tone. Coordination normal.  Skin: Skin is warm and dry. No rash noted. No erythema. No pallor.  Psychiatric: She has a normal mood and affect.          Assessment & Plan:

## 2013-02-13 NOTE — Progress Notes (Signed)
Pre-visit discussion using our clinic review tool. No additional management support is needed unless otherwise documented below in the visit note.  

## 2013-02-13 NOTE — Patient Instructions (Addendum)
Labs today  Don't forget to schedule your annual mammogram  Stop up front for a colonoscopy referral  Try magnesium - 250 mg daily otc for cramps / also diet tonic water sometimes helps Start exercising regularly  Follow up in about a month for 30 min visit including pap and gyn exam

## 2013-02-14 NOTE — Assessment & Plan Note (Signed)
No indication of bacterial infection Disc symptomatic care - see instructions on AVS Update if not starting to improve in a week or if worsening

## 2013-02-14 NOTE — Assessment & Plan Note (Signed)
Lab today Rev strategy for stretching  Disc use of magnesium

## 2013-02-14 NOTE — Assessment & Plan Note (Signed)
Ref for colonoscopy screening

## 2013-02-14 NOTE — Assessment & Plan Note (Signed)
Lab today No clinical changes  

## 2013-02-14 NOTE — Assessment & Plan Note (Signed)
Lipid panel today  Disc goals for lipids and reasons to control them Rev labs with pt from last draw Rev low sat fat diet in detail  lipitor and diet

## 2013-02-14 NOTE — Assessment & Plan Note (Signed)
A1C today Wt loss continues - commended on work on that  Low glycemic diet disc

## 2013-02-15 ENCOUNTER — Other Ambulatory Visit: Payer: Self-pay

## 2013-02-15 DIAGNOSIS — Z1231 Encounter for screening mammogram for malignant neoplasm of breast: Secondary | ICD-10-CM

## 2013-02-19 ENCOUNTER — Telehealth: Payer: Self-pay | Admitting: Family Medicine

## 2013-02-19 ENCOUNTER — Ambulatory Visit: Payer: Self-pay | Admitting: Family Medicine

## 2013-02-19 MED ORDER — AMOXICILLIN-POT CLAVULANATE 875-125 MG PO TABS: 1.0000 | ORAL_TABLET | Freq: Two times a day (BID) | ORAL | Status: DC

## 2013-02-19 NOTE — Telephone Encounter (Signed)
Cancel the f/u I am sending abx to her pharmacy for uri/sinus infx  F/u if no imp

## 2013-02-19 NOTE — Telephone Encounter (Signed)
CAN email down.  Please see CAN report from 02/18/2013 in your box.  Received via fax on 02/19/13.

## 2013-02-19 NOTE — Telephone Encounter (Signed)
Pt says she is not feeling better at all but much worse than what she was. She was told to call back and something would be called in for her. Pt uses [employer]. Pt is scheduled for 3 pm today does she need to keep this Appt. ???

## 2013-02-19 NOTE — Telephone Encounter (Signed)
See prev note

## 2013-02-19 NOTE — Telephone Encounter (Signed)
We ended up calling in abx -she had been sick for a while and will f/u if no imp

## 2013-02-19 NOTE — Telephone Encounter (Signed)
Pt notified Rx sent to pharmacy and to f/u if no improvement after taking abx. appt for today cancelled

## 2013-02-21 ENCOUNTER — Other Ambulatory Visit: Payer: Self-pay

## 2013-02-21 MED ORDER — METAXALONE 800 MG PO TABS: 800.0000 mg | ORAL_TABLET | Freq: Three times a day (TID) | ORAL | Status: DC

## 2013-02-21 NOTE — Telephone Encounter (Signed)
done

## 2013-02-21 NOTE — Telephone Encounter (Signed)
Please refill times one  

## 2013-02-21 NOTE — Telephone Encounter (Signed)
Last prescribed 11/2011 with 2 refills--patient next OV 03/16/2013--please advise

## 2013-03-16 ENCOUNTER — Emergency Department (INDEPENDENT_AMBULATORY_CARE_PROVIDER_SITE_OTHER): Payer: 59

## 2013-03-16 ENCOUNTER — Ambulatory Visit: Payer: 59

## 2013-03-16 ENCOUNTER — Encounter (HOSPITAL_COMMUNITY): Payer: Self-pay | Admitting: Emergency Medicine

## 2013-03-16 ENCOUNTER — Emergency Department (HOSPITAL_COMMUNITY)
Admission: EM | Admit: 2013-03-16 | Discharge: 2013-03-16 | Disposition: A | Discharge status: Home / Self Care | Payer: 59 | Source: Home / Self Care | Attending: Emergency Medicine | Admitting: Emergency Medicine

## 2013-03-16 ENCOUNTER — Ambulatory Visit: Payer: 59 | Admitting: Family Medicine

## 2013-03-16 ENCOUNTER — Telehealth: Payer: Self-pay | Admitting: Family Medicine

## 2013-03-16 DIAGNOSIS — J209 Acute bronchitis, unspecified: Secondary | ICD-10-CM

## 2013-03-16 DIAGNOSIS — J019 Acute sinusitis, unspecified: Secondary | ICD-10-CM

## 2013-03-16 DIAGNOSIS — N3090 Cystitis, unspecified without hematuria: Secondary | ICD-10-CM

## 2013-03-16 LAB — POCT URINALYSIS DIP (DEVICE)
Bilirubin Urine: NEGATIVE
GLUCOSE, UA: NEGATIVE mg/dL
Ketones, ur: NEGATIVE mg/dL
Leukocytes, UA: NEGATIVE
Nitrite: NEGATIVE
PROTEIN: NEGATIVE mg/dL
Specific Gravity, Urine: 1.015 (ref 1.005–1.030)
UROBILINOGEN UA: 0.2 mg/dL (ref 0.0–1.0)
pH: 7 (ref 5.0–8.0)

## 2013-03-16 MED ORDER — MOXIFLOXACIN HCL 400 MG PO TABS: 400.0000 mg | ORAL_TABLET | Freq: Every day | ORAL | Status: DC

## 2013-03-16 MED ORDER — HYDROCOD POLST-CHLORPHEN POLST 10-8 MG/5ML PO LQCR: 5.0000 mL | Freq: Two times a day (BID) | ORAL | Status: DC | PRN

## 2013-03-16 MED ORDER — FLUTICASONE PROPIONATE 50 MCG/ACT NA SUSP: 2.0000 | Freq: Every day | NASAL | Status: DC

## 2013-03-16 MED ORDER — ALBUTEROL SULFATE HFA 108 (90 BASE) MCG/ACT IN AERS: 2.0000 | INHALATION_SPRAY | Freq: Four times a day (QID) | RESPIRATORY_TRACT | Status: DC

## 2013-03-16 NOTE — Telephone Encounter (Signed)
Pt came in this morning thinking her wellness exam was today but it was canceled in February. Pt very upset as she does not feel any better from last months illness. Was wanting to be seen as amoxcillan is not helping and its been 5 weeks. She says she feels horrible and was very emotionally upset.

## 2013-03-16 NOTE — Telephone Encounter (Signed)
Please find out how and why her wellness visit was cancelled - I will also route to [place]  We treated her with augmentin for a sinus infection - I assume she finished it - please ask what symptoms she still has and if she is running a fever  thanks

## 2013-03-16 NOTE — ED Notes (Addendum)
C/o sinus pressure, headache, nasal congestion for 6 weeks except for 3-4 days. Also had cough, earache, and pain in back of her neck. Had sore throat on Mon. and Tues., but thought it was from her sinuses draining. Saw her doctor for annual exam and had cold symptoms. Called office next day and got Rx. of Augmentin on 2/9 and was better for 3-4 days after completing.  One week ago, she started noticing odor with urination.  C/o frequency but no burning.  She had chills off and on this week but none today.

## 2013-03-16 NOTE — Telephone Encounter (Signed)
Pt called back, I apologized for the error of cancelling her appt, I offerd to r/s pt and she was very upset she thought were were going to be able to work her in this afternoon, I advise pt that Dr. Glori Bickers full but I can work her in next week for a CPE, pt advise me that she can't wait that long because of her having several acute illnesses that she wanted addressed, I explained the difference between acute appt and CPE and let her know I could schedule her an acute appt on Monday or our Sat [facility] and then reschedule her CPE too, pt was upset and decided she wanted to go to [facility] and she would call to r/s her CPE if she decides to stay with our office

## 2013-03-16 NOTE — Discharge Instructions (Signed)
Most upper respiratory infections are caused by viruses and do not require antibiotics.  We try to save the antibiotics for when we really need them to prevent bacteria from developing resistance to them.  Here are a few hints about things that can be done at home to help get over an upper respiratory infection quicker: ° °Get extra sleep and extra fluids.  Get 7 to 9 hours of sleep per night and 6 to 8 glasses of water a day.  Getting extra sleep keeps the immune system from getting run down.  Most people with an upper respiratory infection are a little dehydrated.  The extra fluids also keep the secretions liquified and easier to deal with.  Also, get extra vitamin C.  4000 mg per day is the recommended dose. °For the aches, headache, and fever, acetaminophen or ibuprofen are helpful.  These can be alternated every 4 hours.  People with liver disease should avoid large amounts of acetaminophen, and people with ulcer disease, gastroesophageal reflux, gastritis, congestive heart failure, chronic kidney disease, coronary artery disease and the elderly should avoid ibuprofen. °For nasal congestion try Mucinex-D, or if you're having lots of sneezing or clear nasal drainage use Zyrtec-D. People with high blood pressure can take these if their blood pressure is controlled, if not, it's best to avoid the forms with a "D" (decongestants).  You can use the plain Mucinex, Allegra, Claritin, or Zyrtec even if your blood pressure is not controlled.   °A Saline nasal spray such as Ocean Spray can also help.  You can add a decongestant sprays such as Afrin, but you should not use the decongestant sprays for more than 3 or 4 days since they can be habituating.  Breathe Rite nasal strips can also offer a non-drug alternative treatment to nasal congestion, especially at night. °For people with symptoms of sinusitis, sleeping with your head elevated can be helpful.  For sinus pain, moist, hot compresses to the face may provide some  relief.  Many people find that inhaling steam as in a shower or from a pot of steaming water can help. °For any viral infection, zinc containing lozenges such as Cold-Eze or Zicam are helpful.  Zinc helps to fight viral infection.  Hot salt water gargles (8 oz of hot water, 1/2 tsp of table salt, and a pinch of baking soda) can give relief as well as hot beverages such as hot tea.  Sucrets extra strength lozenges will help the sore throat.  °For the cough, take Delsym 2 tsp every 12 hours.  It has also been found recently that Aleve can help control a cough.  The dose is 1 to 2 tablets twice daily with food.  This can be combined with Delsym. (Note, if you are taking ibuprofen, you should not take Aleve as well--take one or the other.) °A cool mist vaporizer will help keep your mucous membranes from drying out.  ° °It's important when you have an upper respiratory infection not to pass the infection to others.  This involves being very careful about the following: ° °Frequent hand washing or use of hand sanitizer, especially after coughing, sneezing, blowing your nose or touching your face, nose or eyes. °Do not shake hands or touch anyone and try to avoid touching surfaces that other people use such as doorknobs, shopping carts, telephones and computer keyboards. °Use tissues and dispose of them properly in a garbage can or ziplock bag. °Cough into your sleeve. °Do not let others eat or   drink after you. ° °It's also important to recognize the signs of serious illness and get evaluated if they occur: °Any respiratory infection that lasts more than 7 to 10 days.  Yellow nasal drainage and sputum are not reliable indicators of a bacterial infection, but if they last for more than 1 week, see your doctor. °Fever and sore throat can indicate strep. °Fever and cough can indicate influenza or pneumonia. °Any kind of severe symptom such as difficulty breathing, intractable vomiting, or severe pain should prompt you to see  a doctor as soon as possible. ° ° °Your body's immune system is really the thing that will get rid of this infection.  Your immune system is comprised of 2 types of specialized cells called T cells and B cells.  T cells coordinate the array of cells in your body that engulf invading bacteria or viruses while B cells orchestrate the production of antibodies that neutralize infection.  Anything we do or any medications we give you, will just strengthen your immune system or help it clear up the infection quicker.  Here are a few helpful hints to improve your immune system to help overcome this illness or to prevent future infections: °· A few vitamins can improve the health of your immune system.  That's why your diet should include plenty of fruits, vegetables, fish, nuts, and whole grains. °· Vitamin A and bet-carotene can increase the cells that fight infections (T cells and B cells).  Vitamin A is abundant in dark greens and orange vegetables such as spinach, greens, sweet potatoes, and carrots. °· Vitamin B6 contributes to the maturation of white blood cells, the cells that fight disease.  Foods with vitamin B6 include cold cereal and bananas. °· Vitamin C is credited with preventing colds because it increases white blood cells and also prevents cellular damage.  Citrus fruits, peaches and green and red bell peppers are all hight in vitamin C. °· Vitamin E is an anti-oxidant that encourages the production of natural killer cells which reject foreign invaders and B cells that produce antibodies.  Foods high in vitamin E include wheat germ, nuts and seeds. °· Foods high in omega-3 fatty acids found in foods like salmon, tuna and mackerel boost your immune system and help cells to engulf and absorb germs. °· Probiotics are good bacteria that increase your T cells.  These can be found in yogurt and are available in supplements such as Culturelle or Align. °· Moderate exercise increases the strength of your immune  system and your ability to recover from illness.  I suggest 3 to 5 moderate intensity 30 minute workouts per week.   °· Sleep is another component of maintaining a strong immune system.  It enables your body to recuperate from the day's activities, stress and work.  My recommendation is to get between 7 and 9 hours of sleep per night. °· If you smoke, try to quit completely or at least cut down.  Drink alcohol only in moderation if at all.  No more than 2 drinks daily for men or 1 for women. °· Get a flu vaccine early in the fall or if you have not gotten one yet, once this illness has run its course.  If you are over 65, a smoker, or an asthmatic, get a pneumococcal vaccine. °· My final recommendation is to maintain a healthy weight.  Excess weight can impair the immune system by interfering with the way the immune system deals with invading viruses or   bacteria.   Urinary Tract Infection Urinary tract infections (UTIs) can develop anywhere along your urinary tract. Your urinary tract is your body's drainage system for removing wastes and extra water. Your urinary tract includes two kidneys, two ureters, a bladder, and a urethra. Your kidneys are a pair of bean-shaped organs. Each kidney is about the size of your fist. They are located below your ribs, one on each side of your spine. CAUSES Infections are caused by microbes, which are microscopic organisms, including fungi, viruses, and bacteria. These organisms are so small that they can only be seen through a microscope. Bacteria are the microbes that most commonly cause UTIs. SYMPTOMS  Symptoms of UTIs may vary by age and gender of the patient and by the location of the infection. Symptoms in young women typically include a frequent and intense urge to urinate and a painful, burning feeling in the bladder or urethra during urination. Older women and men are more likely to be tired, shaky, and weak and have muscle aches and abdominal pain. A fever may mean  the infection is in your kidneys. Other symptoms of a kidney infection include pain in your back or sides below the ribs, nausea, and vomiting. DIAGNOSIS To diagnose a UTI, your caregiver will ask you about your symptoms. Your caregiver also will ask to provide a urine sample. The urine sample will be tested for bacteria and white blood cells. White blood cells are made by your body to help fight infection. TREATMENT  Typically, UTIs can be treated with medication. Because most UTIs are caused by a bacterial infection, they usually can be treated with the use of antibiotics. The choice of antibiotic and length of treatment depend on your symptoms and the type of bacteria causing your infection. HOME CARE INSTRUCTIONS  If you were prescribed antibiotics, take them exactly as your caregiver instructs you. Finish the medication even if you feel better after you have only taken some of the medication.  Drink enough water and fluids to keep your urine clear or pale yellow.  Avoid caffeine, tea, and carbonated beverages. They tend to irritate your bladder.  Empty your bladder often. Avoid holding urine for long periods of time.  Empty your bladder before and after sexual intercourse.  After a bowel movement, women should cleanse from front to back. Use each tissue only once. SEEK MEDICAL CARE IF:   You have back pain.  You develop a fever.  Your symptoms do not begin to resolve within 3 days. SEEK IMMEDIATE MEDICAL CARE IF:   You have severe back pain or lower abdominal pain.  You develop chills.  You have nausea or vomiting.  You have continued burning or discomfort with urination. MAKE SURE YOU:   Understand these instructions.  Will watch your condition.  Will get help right away if you are not doing well or get worse. Document Released: 10/07/2004 Document Revised: 06/29/2011 Document Reviewed: 02/05/2011 [facility] Patient Information 2014 [place].

## 2013-03-16 NOTE — ED Provider Notes (Signed)
Chief Complaint   Chief Complaint  Patient presents with  . Urinary Tract Infection  . URI    History of Present Illness   Evelyn Shepherd is a 53 year old female who has had a one-month history of cough and chest congestion. She was seen at the [facility] office in [place] 4 weeks ago. She had had URI symptoms for about a week. She was offered symptomatic treatment at that time. No testing was done. She called back to their office stating that she was no better. On February 9 she was begun on amoxicillin which she took for 10 days. She felt better for about 2 days after that but then again developed cough productive yellow, blood-tinged sputum, chest tightness, soreness in the chest, sore throat, bilateral earache, aching in her posterior neck, headache, nasal congestion with green drainage, and aching behind her eyes. She denies any fever or chills. Also for the past week her urine has had a strong odor. She's had some frequency and urgency and lower back and lower abdominal pain. She denies any dysuria or hematuria. She has had urinary tract infections before, but it's been a long time.  Review of Systems   Other than as noted above, the patient denies any of the following symptoms: Systemic:  No fevers, chills, sweats, or myalgias. Eye:  No redness or discharge. ENT:  No ear pain, headache, nasal congestion, drainage, sinus pressure, or sore throat. Neck:  No neck pain, stiffness, or swollen glands. Lungs:  No cough, sputum production, hemoptysis, wheezing, chest tightness, shortness of breath or chest pain. GI:  No abdominal pain, nausea, vomiting or diarrhea.  [place]   Past medical history, family history, social history, meds, and allergies were reviewed. She's allergic to sulfa. She takes Lexapro, atorvastatin, and Imitrex. She has a history of hypercholesterolemia, migraine headaches, and depression  Physical exam   Vital signs:  BP 120/82  Pulse 86  Temp(Src) 98.6  F (37 C) (Oral)  Resp 18  SpO2 100% General:  Alert and oriented.  In no distress.  Skin warm and dry. Eye:  No conjunctival injection or drainage. Lids were normal. ENT:  TMs and canals were normal, without erythema or inflammation.  Nasal mucosa was clear and uncongested, without drainage.  Mucous membranes were moist.  Pharynx was clear with no exudate or drainage.  There were no oral ulcerations or lesions. Neck:  Supple, no adenopathy, tenderness or mass. Lungs:  No respiratory distress.  Lungs were clear to auscultation, without wheezes, rales or rhonchi.  Breath sounds were clear and equal bilaterally.  Heart:  Regular rhythm, without gallops, murmers or rubs. Skin:  Clear, warm, and dry, without rash or lesions.  Labs   Results for orders placed during the hospital encounter of 03/16/13  POCT URINALYSIS DIP (DEVICE)      Result Value Ref Range   Glucose, UA NEGATIVE  NEGATIVE mg/dL   Bilirubin Urine NEGATIVE  NEGATIVE   Ketones, ur NEGATIVE  NEGATIVE mg/dL   Specific Gravity, Urine 1.015  1.005 - 1.030   Hgb urine dipstick MODERATE (*) NEGATIVE   pH 7.0  5.0 - 8.0   Protein, ur NEGATIVE  NEGATIVE mg/dL   Urobilinogen, UA 0.2  0.0 - 1.0 mg/dL   Nitrite NEGATIVE  NEGATIVE   Leukocytes, UA NEGATIVE  NEGATIVE    Urine was cultured.  Radiology   Dg Chest 2 View  03/16/2013   CLINICAL DATA:  Cough, congestion, sore throat.  EXAM: CHEST  2  VIEW  COMPARISON:  05/09/2007.  FINDINGS: The heart size and mediastinal contours are within normal limits. Both lungs are clear. The visualized skeletal structures are unremarkable.  IMPRESSION: No active cardiopulmonary disease.   Electronically Signed   By: Rolm Baptise M.D.   On: 03/16/2013 18:49   Assessment     The primary encounter diagnosis was Acute bronchitis. Diagnoses of Acute sinusitis and Cystitis were also pertinent to this visit.  Plan    1.  Meds:  The following meds were prescribed:   Discharge Medication List as of  03/16/2013  7:28 PM    START taking these medications   Details  albuterol (PROVENTIL HFA;VENTOLIN HFA) 108 (90 BASE) MCG/ACT inhaler Inhale 2 puffs into the lungs 4 (four) times daily., Starting 03/16/2013, Until Discontinued, Normal    chlorpheniramine-HYDROcodone (TUSSIONEX) 10-8 MG/5ML LQCR Take 5 mLs by mouth every 12 (twelve) hours as needed for cough., Starting 03/16/2013, Until Discontinued, Normal    fluticasone (FLONASE) 50 MCG/ACT nasal spray Place 2 sprays into both nostrils daily., Starting 03/16/2013, Until Discontinued, Normal    moxifloxacin (AVELOX) 400 MG tablet Take 1 tablet (400 mg total) by mouth daily at 8 pm., Starting 03/16/2013, Until Discontinued, Normal        2.  Patient Education/Counseling:  The patient was given appropriate handouts, self care instructions, and instructed in symptomatic relief.  Instructed to get extra fluids, rest, and use a cool mist vaporizer.    3.  Follow up:  The patient was told to follow up here if no better in 3 to 4 days, or sooner if becoming worse in any way, and given some red flag symptoms such as increasing fever, difficulty breathing, chest pain, or persistent vomiting which would prompt immediate return.  Follow up here as needed.      Harden Mo, MD 03/16/13 2215

## 2013-03-16 NOTE — Telephone Encounter (Signed)
Aware. thanks

## 2013-03-19 LAB — URINE CULTURE: Colony Count: >=100000

## 2013-03-19 NOTE — ED Notes (Signed)
Urine culture: >100,000 colonies E. Coli.  Pt adequately treated with Moxifloxacin. Roselyn Meier 03/19/2013

## 2013-03-19 NOTE — Progress Notes (Signed)
Quick Note:  Results are abnormal as noted, but have been adequately treated. No further action necessary. ______ 

## 2013-03-21 ENCOUNTER — Encounter: Payer: Self-pay | Admitting: Family Medicine

## 2013-03-21 ENCOUNTER — Encounter: Payer: Self-pay | Admitting: Gastroenterology

## 2013-04-03 ENCOUNTER — Encounter: Payer: 59 | Admitting: Family Medicine

## 2013-04-04 ENCOUNTER — Encounter: Payer: 59 | Admitting: Family Medicine

## 2013-04-04 ENCOUNTER — Encounter: Payer: Self-pay | Admitting: Family Medicine

## 2013-04-10 ENCOUNTER — Encounter: Payer: 59 | Admitting: Family Medicine

## 2013-04-11 ENCOUNTER — Encounter: Payer: Self-pay | Admitting: Family Medicine

## 2013-04-11 ENCOUNTER — Ambulatory Visit (INDEPENDENT_AMBULATORY_CARE_PROVIDER_SITE_OTHER): Payer: 59 | Admitting: Family Medicine

## 2013-04-11 ENCOUNTER — Other Ambulatory Visit (HOSPITAL_COMMUNITY)
Admission: RE | Admit: 2013-04-11 | Discharge: 2013-04-11 | Disposition: A | Discharge status: Home / Self Care | Payer: 59 | Source: Ambulatory Visit | Attending: Family Medicine | Admitting: Family Medicine

## 2013-04-11 VITALS — BP 98/70 | HR 76 | Temp 97.6°F | Ht 66.0 in | Wt 204.5 lb

## 2013-04-11 DIAGNOSIS — R739 Hyperglycemia, unspecified: Secondary | ICD-10-CM

## 2013-04-11 DIAGNOSIS — R7309 Other abnormal glucose: Secondary | ICD-10-CM

## 2013-04-11 DIAGNOSIS — E785 Hyperlipidemia, unspecified: Secondary | ICD-10-CM

## 2013-04-11 DIAGNOSIS — Z0000 Encounter for general adult medical examination without abnormal findings: Secondary | ICD-10-CM

## 2013-04-11 DIAGNOSIS — Z1151 Encounter for screening for human papillomavirus (HPV): Secondary | ICD-10-CM | POA: Insufficient documentation

## 2013-04-11 DIAGNOSIS — M722 Plantar fascial fibromatosis: Secondary | ICD-10-CM

## 2013-04-11 DIAGNOSIS — Z01419 Encounter for gynecological examination (general) (routine) without abnormal findings: Secondary | ICD-10-CM

## 2013-04-11 DIAGNOSIS — R079 Chest pain, unspecified: Secondary | ICD-10-CM

## 2013-04-11 DIAGNOSIS — N390 Urinary tract infection, site not specified: Secondary | ICD-10-CM

## 2013-04-11 DIAGNOSIS — R0789 Other chest pain: Secondary | ICD-10-CM | POA: Insufficient documentation

## 2013-04-11 DIAGNOSIS — E039 Hypothyroidism, unspecified: Secondary | ICD-10-CM

## 2013-04-11 DIAGNOSIS — R109 Unspecified abdominal pain: Secondary | ICD-10-CM

## 2013-04-11 LAB — POCT URINALYSIS DIPSTICK
Bilirubin, UA: NEGATIVE
Glucose, UA: NEGATIVE
KETONES UA: NEGATIVE
Leukocytes, UA: NEGATIVE
Nitrite, UA: NEGATIVE
PH UA: 6.5
Protein, UA: NEGATIVE
Spec Grav, UA: 1.015
Urobilinogen, UA: 0.2

## 2013-04-11 MED ORDER — SUMATRIPTAN SUCCINATE 100 MG PO TABS: 100.0000 mg | ORAL_TABLET | Freq: Every day | ORAL | Status: DC | PRN

## 2013-04-11 NOTE — Progress Notes (Signed)
Pre visit review using our clinic review tool, if applicable. No additional management support is needed unless otherwise documented below in the visit note. 

## 2013-04-11 NOTE — Progress Notes (Signed)
Subjective:    Patient ID: Evelyn Shepherd, female    DOB: May 09, 1960, 53 y.o.   MRN: [id number]  HPI Here for health maintenance exam and to review chronic medical problems   Had a horrible uri and sinus infection  Still a bit tired  Just had a uti -tx by UC  Urine today still has some blood on dip She has some back discomfort    Wt is up 34 lb  Gastric band in the past  bmi of 33   Did not exercise during the time she was sick - is ready to start some walking  Diet not good - she cannot eat a lot due to gastric band but still eats the wrong things sometimes  On and off - does not eat fried foods and avoids bread  Plenty of salads with a variety of vegetables   Colon cancer screen -has a colonoscopy scheduled for the middle of May  Pap 1/12 normal- due for one  No period in 2-3 years and no problems  (she had hot flashes much earlier-much better now)    Flu vaccine 10/14  Mammogram 3/15  Self exam -no lumps at all   Td 1/08   Lab Results  Component Value Date   WBC 5.8 02/13/2013   HGB 11.8* 02/13/2013   HCT 36.5 02/13/2013   MCV 89.1 02/13/2013   PLT 215.0 02/13/2013    Had lap band  She has a restricted diet -hard to get enough iron     Chemistry      Component Value Date/Time   NA 140 02/13/2013 0912   K 4.2 02/13/2013 0912   CL 108 02/13/2013 0912   CO2 28 02/13/2013 0912   BUN 13 02/13/2013 0912   CREATININE 0.7 02/13/2013 0912      Component Value Date/Time   CALCIUM 8.7 02/13/2013 0912   ALKPHOS 75 02/13/2013 0912   AST 20 02/13/2013 0912   ALT 28 02/13/2013 0912   BILITOT 0.5 02/13/2013 0912      Lab Results  Component Value Date   HGBA1C 5.8 02/13/2013    Lab Results  Component Value Date   CHOL 198 02/13/2013   CHOL 154 12/17/2011   CHOL 239* 10/22/2011   Lab Results  Component Value Date   HDL 62.10 02/13/2013   HDL 49.00 12/17/2011   HDL 50.50 10/22/2011   Lab Results  Component Value Date   LDLCALC 126* 02/13/2013   Reading 88 12/17/2011   LDLCALC 106* 08/17/2007    Lab Results  Component Value Date   TRIG 51.0 02/13/2013   TRIG 83.0 12/17/2011   TRIG 65.0 10/22/2011   Lab Results  Component Value Date   CHOLHDL 3 02/13/2013   CHOLHDL 3 12/17/2011   CHOLHDL 5 10/22/2011   Lab Results  Component Value Date   LDLDIRECT 187.8 10/22/2011   LDLDIRECT 157.7 03/25/2011   LDLDIRECT 164.3 01/11/2008   she missed doses of lipitor - she gets in a hurry and forgets it    Pt mentions on the way out that she had an episode of cp when she was walking around an [employer] (not big exertion) Pain in mid chest- burning in nature -lasted a few minutes No nausea or sweating or sob at all  This never happened before  She did have pain in back between shoulder blades once when out with her husband   Also has heel pain  Worse when she gets up in am or  after resting  No swelling   Patient Active Problem List   Diagnosis Date Noted  . Colon cancer screening 02/13/2013  . Leg cramps 02/13/2013  . Viral URI 02/13/2013  . Other screening mammogram 04/13/2011  . Special screening for malignant neoplasms, colon 04/13/2011  . Seborrheic dermatitis 04/13/2011  . Routine general medical examination at a health care facility 03/25/2011  . AMENORRHEA 09/18/2008  . MENORRHAGIA 11/28/2007  . HYPOTHYROIDISM 08/11/2007  . LOW BACK PAIN, CHRONIC 07/14/2006  . Hyperglycemia 07/06/2006  . HYPERLIPIDEMIA 07/06/2006  . DEPRESSION 07/06/2006  . COMMON MIGRAINE 07/06/2006  . GERD 07/06/2006   Past Medical History  Diagnosis Date  . History of depression   . Diabetes mellitus     Type II, 3/07  . History of gastroesophageal reflux (GERD)   . History of hyperlipidemia   . History of migraine   . History of obesity     lap band surgery  . History of pneumonia 2005   Past Surgical History  Procedure Laterality Date  . Cesarean section    . Breast enhancement surgery    . Lumbar disc surgery    . Dental surgery    . Tongue surgery  03/2005    lesion removal  .  Laparoscopic gastric banding  07/31/07   History  Substance Use Topics  . Smoking status: Former Smoker    Quit date: 04/05/2005  . Smokeless tobacco: Never Used  . Alcohol Use: Yes     Comment: occasional   Family History  Problem Relation Age of Onset  . Kidney cancer Mother   . Diabetes Mother   . Pneumonia Father     died  . Lung cancer Father   . Depression Brother     commited suicide  . Alcohol abuse Brother   . Early menopause Other     family   Allergies  Allergen Reactions  . Meloxicam     REACTION: unspecified  . Sulfonamide Derivatives     REACTION: unspecified   Current Outpatient Prescriptions on File Prior to Visit  Medication Sig Dispense Refill  . atorvastatin (LIPITOR) 10 MG tablet Take 1 tablet (10 mg total) by mouth daily.  90 tablet  3  . escitalopram (LEXAPRO) 20 MG tablet TAKE 1 TABLET (20 MG TOTAL) BY MOUTH DAILY.  90 tablet  3  . ketoconazole (NIZORAL) 2 % cream Apply topically daily. To areas of face that are affected  60 g  0  . metaxalone (SKELAXIN) 800 MG tablet Take 1 tablet (800 mg total) by mouth 3 (three) times daily. As needed for back pain caution of sedation  30 tablet  0   No current facility-administered medications on file prior to visit.      Review of Systems Review of Systems  Constitutional: Negative for fever, appetite change,  and unexpected weight change.  Eyes: Negative for pain and visual disturbance.  Respiratory: Negative for cough and shortness of breath.   Cardiovascular: Negative for palpitations   neg for pedal edema or pnd or orthopnea  Gastrointestinal: Negative for nausea, diarrhea and constipation.  Genitourinary: Negative for urgency and frequency.  Skin: Negative for pallor or rash  pos for some skin tags  MSK pos for heel pain on both feet , neg for joint swelling  Neurological: Negative for weakness, light-headedness, numbness and headaches.  Hematological: Negative for adenopathy. Does not bruise/bleed  easily.  Psychiatric/Behavioral: Negative for dysphoric mood. The patient is not nervous/anxious.  Objective:   Physical Exam  Constitutional: She appears well-developed and well-nourished. No distress.  obese and well appearing   HENT:  Head: Normocephalic and atraumatic.  Right Ear: External ear normal.  Left Ear: External ear normal.  Mouth/Throat: Oropharynx is clear and moist.  Eyes: Conjunctivae and EOM are normal. Pupils are equal, round, and reactive to light. No scleral icterus.  Neck: Normal range of motion. Neck supple. No JVD present. Carotid bruit is not present. No thyromegaly present.  Cardiovascular: Normal rate, regular rhythm, normal heart sounds and intact distal pulses.  Exam reveals no gallop.   Pulmonary/Chest: Effort normal and breath sounds normal. No respiratory distress. She has no wheezes. She has no rales. She exhibits no tenderness.  Abdominal: Soft. Bowel sounds are normal. She exhibits no distension, no abdominal bruit and no mass. There is no tenderness.  Genitourinary: Vagina normal and uterus normal. No breast swelling, tenderness, discharge or bleeding. There is no rash, tenderness or lesion on the right labia. There is no rash, tenderness or lesion on the left labia. Uterus is not enlarged and not tender. Cervix exhibits no motion tenderness, no discharge and no friability. Right adnexum displays no mass, no tenderness and no fullness. Left adnexum displays no mass, no tenderness and no fullness. No bleeding around the vagina. No vaginal discharge found.  Breast exam: No mass, nodules, thickening, tenderness, bulging, retraction, inflamation, nipple discharge or skin changes noted.  No axillary or clavicular LA.      Musculoskeletal: Normal range of motion. She exhibits tenderness. She exhibits no edema.  Some tenderness at base of heel into arch on both feet No bony change/ joint change Nl rom  Nl perf and sens  No skin changes   Lymphadenopathy:     She has no cervical adenopathy.  Neurological: She is alert. She has normal strength and normal reflexes. She displays no atrophy and no tremor. No cranial nerve deficit or sensory deficit. She exhibits normal muscle tone. Coordination normal.  Skin: Skin is warm and dry. No rash noted. No erythema. No pallor.  Psychiatric: She has a normal mood and affect.          Assessment & Plan:

## 2013-04-11 NOTE — Patient Instructions (Addendum)
I think you may have plantar fasciitis  Try rolling your foot over a frozen can with a towel on it -massage the cold into it for 5-10 minutes  Get a children's multivitamin with iron for anemia  Eat a healthy diet and get exercise  We sent your urine for another culture to make sure infection is better - please drink a lot of water  Someone from our office will you about a cardiology referral  If chest pain returns / persists - go to the ER   Plantar Fasciitis Plantar fasciitis is a common condition that causes foot pain. It is soreness (inflammation) of the band of tough fibrous tissue on the bottom of the foot that runs from the heel bone (calcaneus) to the ball of the foot. The cause of this soreness may be from excessive standing, poor fitting shoes, running on hard surfaces, being overweight, having an abnormal walk, or overuse (this is common in runners) of the painful foot or feet. It is also common in aerobic exercise dancers and ballet dancers. SYMPTOMS  Most people with plantar fasciitis complain of:  Severe pain in the morning on the bottom of their foot especially when taking the first steps out of bed. This pain recedes after a few minutes of walking.  Severe pain is experienced also during walking following a long period of inactivity.  Pain is worse when walking barefoot or up stairs DIAGNOSIS   Your caregiver will diagnose this condition by examining and feeling your foot.  Special tests such as X-rays of your foot, are usually not needed. PREVENTION   Consult a sports medicine professional before beginning a new exercise program.  Walking programs offer a good workout. With walking there is a lower chance of overuse injuries common to runners. There is less impact and less jarring of the joints.  Begin all new exercise programs slowly. If problems or pain develop, decrease the amount of time or distance until you are at a comfortable level.  Wear good shoes and replace  them regularly.  Stretch your foot and the heel cords at the back of the ankle (Achilles tendon) both before and after exercise.  Run or exercise on even surfaces that are not hard. For example, asphalt is better than pavement.  Do not run barefoot on hard surfaces.  If using a treadmill, vary the incline.  Do not continue to workout if you have foot or joint problems. Seek professional help if they do not improve. HOME CARE INSTRUCTIONS   Avoid activities that cause you pain until you recover.  Use ice or cold packs on the problem or painful areas after working out.  Only take over-the-counter or prescription medicines for pain, discomfort, or fever as directed by your caregiver.  Soft shoe inserts or athletic shoes with air or gel sole cushions may be helpful.  If problems continue or become more severe, consult a sports medicine caregiver or your own health care provider. Cortisone is a potent anti-inflammatory medication that may be injected into the painful area. You can discuss this treatment with your caregiver. MAKE SURE YOU:   Understand these instructions.  Will watch your condition.  Will get help right away if you are not doing well or get worse. Document Released: 09/22/2000 Document Revised: 03/22/2011 Document Reviewed: 11/22/2007 [facility] Patient Information 2014 [place], [place].

## 2013-04-12 NOTE — Assessment & Plan Note (Signed)
Disc symptomatic care - see instructions on AVS  Will try ice/ nsaid prn/ and wear supportive shoes If no improvement consider sport med appt Wt loss enc

## 2013-04-12 NOTE — Assessment & Plan Note (Signed)
Hypothyroidism  Pt has no clinical changes No change in energy level/ hair or skin/ edema and no tremor Lab Results  Component Value Date   TSH 1.57 02/13/2013

## 2013-04-12 NOTE — Assessment & Plan Note (Signed)
Reviewed health habits including diet and exercise and skin cancer prevention Reviewed appropriate screening tests for age  Also reviewed health mt list, fam hx and immunization status , as well as social and family history   Labs reviewed  

## 2013-04-12 NOTE — Assessment & Plan Note (Signed)
Annual exam with pap done today  No complaints

## 2013-04-12 NOTE — Assessment & Plan Note (Signed)
New non exertional/ atypical cp (2 episodes) in pt with hx of obesity and high cholesterol No acute changes on EKG but given the severity of symptoms when they occur-warrants further eval Ref to cardiology If symptoms return or persist- aware to get to ER immediately

## 2013-04-12 NOTE — Assessment & Plan Note (Signed)
Disc goals for lipids and reasons to control them Rev labs with pt Rev low sat fat diet in detail Up a bit with some skipped statin doses-enc good diet and better compliance with med

## 2013-04-12 NOTE — Assessment & Plan Note (Signed)
Urine sent for cx  Recent uti with some back discomfort remaining ua - with tr blood

## 2013-04-12 NOTE — Assessment & Plan Note (Signed)
This remains well controlled with diet Lab Results  Component Value Date   HGBA1C 5.8 02/13/2013   enc to begin exercise again

## 2013-04-14 LAB — URINE CULTURE
COLONY COUNT: NO GROWTH
Organism ID, Bacteria: NO GROWTH

## 2013-04-25 ENCOUNTER — Ambulatory Visit: Payer: 59 | Admitting: Cardiovascular Disease

## 2013-04-25 ENCOUNTER — Encounter: Payer: Self-pay | Admitting: Cardiovascular Disease

## 2013-04-25 ENCOUNTER — Ambulatory Visit (INDEPENDENT_AMBULATORY_CARE_PROVIDER_SITE_OTHER): Payer: 59 | Admitting: Cardiovascular Disease

## 2013-04-25 VITALS — BP 100/70 | HR 83 | Ht 66.0 in | Wt 202.0 lb

## 2013-04-25 DIAGNOSIS — R0989 Other specified symptoms and signs involving the circulatory and respiratory systems: Secondary | ICD-10-CM

## 2013-04-25 DIAGNOSIS — R0609 Other forms of dyspnea: Secondary | ICD-10-CM

## 2013-04-25 DIAGNOSIS — R079 Chest pain, unspecified: Secondary | ICD-10-CM

## 2013-04-25 DIAGNOSIS — R0683 Snoring: Secondary | ICD-10-CM

## 2013-04-25 NOTE — Patient Instructions (Signed)
Your physician has recommended that you have a sleep study. This test records several body functions during sleep, including: brain activity, eye movement, oxygen and carbon dioxide blood levels, heart rate and rhythm, breathing rate and rhythm, the flow of air through your mouth and nose, snoring, body muscle movements, and chest and belly movement.  Your physician has requested that you have an exercise stress myoview. For further information please visit [email or web address]. Please follow instruction sheet, as given.  Your physician recommends that you continue on your current medications as directed. Please refer to the Current Medication list given to you today.  Your physician wants you to follow-up as needed with Dr Burt Knack.  You will receive a reminder letter in the mail two months in advance. If you don't receive a letter, please call our office to schedule the follow-up appointment.

## 2013-04-27 ENCOUNTER — Encounter: Payer: 59 | Admitting: Family Medicine

## 2013-04-27 NOTE — Progress Notes (Signed)
HPI:  53 year-old woman presenting for cardiac evaluation. She had a recent episode approximately 2 weeks ago when she was walking and developed a central chest and jaw pain, felt like a squeezing sensation. No radiation of pain to back or left arm. She felt weak all over. No frank syncope, nausea, vomiting, or diaphoresis. Has had 2-3 similar episodes in the past. This episode was self-limited and she did not seek immediate medical attention.   Otherwise she feels ok. No chest discomfort with routine activities or physical exertion on a regular basis. No past hx of cardiac problems.   Medical hx pertinent for obesity with lap band surgery, migraine headaches, and hyperlipidemia. Her Mother had a 'mild heart attack' in her 13's. She did not require PCI or CABG.   Outpatient Encounter Prescriptions as of 04/25/2013  Medication Sig  . atorvastatin (LIPITOR) 10 MG tablet Take 1 tablet (10 mg total) by mouth daily.  Marland Kitchen escitalopram (LEXAPRO) 20 MG tablet TAKE 1 TABLET (20 MG TOTAL) BY MOUTH DAILY.  Marland Kitchen ketoconazole (NIZORAL) 2 % cream Apply topically daily. To areas of face that are affected  . metaxalone (SKELAXIN) 800 MG tablet Take 1 tablet (800 mg total) by mouth 3 (three) times daily. As needed for back pain caution of sedation  . SUMAtriptan (IMITREX) 100 MG tablet Take 1 tablet (100 mg total) by mouth daily as needed for migraine.    Meloxicam and Sulfonamide derivatives  Past Medical History  Diagnosis Date  . History of depression   . Diabetes mellitus     Type II, 3/07  . History of gastroesophageal reflux (GERD)   . History of hyperlipidemia   . History of migraine   . History of obesity     lap band surgery  . History of pneumonia 2005    Past Surgical History  Procedure Laterality Date  . Cesarean section    . Breast enhancement surgery    . Lumbar disc surgery    . Dental surgery    . Tongue surgery  03/2005    lesion removal  . Laparoscopic gastric banding  07/31/07      History   Social History  . Marital Status: Married    Spouse Name: N/A    Number of Children: N/A  . Years of Education: N/A   Occupational History  . Not on file.   Social History Main Topics  . Smoking status: Former Smoker    Quit date: 04/05/2005  . Smokeless tobacco: Never Used  . Alcohol Use: Yes     Comment: occasional  . Drug Use: No  . Sexual Activity: Not on file   Other Topics Concern  . Not on file   Social History Narrative  . No narrative on file    Family History  Problem Relation Age of Onset  . Kidney cancer Mother   . Diabetes Mother   . Pneumonia Father     died  . Lung cancer Father   . Depression Brother     commited suicide  . Alcohol abuse Brother   . Early menopause Other     family    ROS:  General: no fevers/chills/night sweats. Positive for fatigue, snoring Eyes: no blurry vision, diplopia, or amaurosis ENT: no sore throat or hearing loss Resp: positive for cough, recent bronchitis CV: see HPI GI: no abdominal pain, nausea, vomiting, diarrhea, or constipation GU: no dysuria, frequency, or hematuria Skin: no rash Neuro: positive for headache, no numbness, tingling, or  weakness of extremities Musculoskeletal: positive for back pain Heme: no bleeding, DVT, or easy bruising Endo: no polydipsia or polyuria  BP 100/70  Pulse 83  Ht 5\' 6"  (1.676 m)  Wt 202 lb (91.627 kg)  BMI 32.62 kg/m2  PHYSICAL EXAM: Pt is alert and oriented, WD, WN, in no distress. HEENT: normal Neck: JVP normal. Carotid upstrokes normal without bruits. No thyromegaly. Lungs: equal expansion, clear bilaterally CV: Apex is discrete and nondisplaced, RRR without murmur or gallop Abd: soft, NT, +BS, no bruit, no hepatosplenomegaly Back: no CVA tenderness Ext: no C/C/E        DP/PT pulses intact and = Skin: warm and dry without rash Neuro: CNII-XII intact             Strength intact = bilaterally  EKG:  Reviewed from 4/1. NSR with nonspecific T wave  abnormality  ASSESSMENT AND PLAN: 1. Chest pain. Profound symptoms now on repeated episodes. CVRF's include family hx CAD, former smoker quit 2007, and hyperlipidemia. Will evaluate for cardiac ischemia with an exercise Myoview stress test. Will arrange follow-up if any significant abnormalities on her stress test.  2. Excessive daytime somnolence. Has been told of snoring, abnormal breathing pattern at night. Will arrange overnight sleep study.  Sherren Mocha 04/27/2013 11:39 PM

## 2013-05-02 ENCOUNTER — Encounter: Payer: 59 | Admitting: Family Medicine

## 2013-05-03 ENCOUNTER — Telehealth: Payer: Self-pay

## 2013-05-03 ENCOUNTER — Ambulatory Visit (AMBULATORY_SURGERY_CENTER): Payer: Self-pay

## 2013-05-03 VITALS — Ht 65.0 in | Wt 203.4 lb

## 2013-05-03 DIAGNOSIS — Z1211 Encounter for screening for malignant neoplasm of colon: Secondary | ICD-10-CM

## 2013-05-03 MED ORDER — SUPREP BOWEL PREP KIT 17.5-3.13-1.6 GM/177ML PO SOLN: 1.0000 | Freq: Once | ORAL | Status: DC

## 2013-05-03 NOTE — Progress Notes (Signed)
Pt is scheduled to have nuclear medicine myoview on 5/5.  Colon with [facility] on 5/7.  Pt will advise staff at [facility] to inform [facility] of results ASAP.  Pt not wanting to reschedule as [facility] does not have an opening that fits her schedule until August.

## 2013-05-03 NOTE — Progress Notes (Signed)
Not allergic to eggs or soy. No diet/weight loss meds. No home oxygen. Emmi instructions given for colonoscopy.  Pt has email.

## 2013-05-03 NOTE — Telephone Encounter (Signed)
Pt recently had self-limiting chest pain, resolved without intervention.  Scheduled for myoview on 5/5.  Scheduled with [facility] on 5/7.  Do you want to see pt in office first or are you OK with proceeding.  She has had her previsit on 4/23.

## 2013-05-04 NOTE — Telephone Encounter (Signed)
Best to evaluate in the office first

## 2013-05-07 ENCOUNTER — Telehealth: Payer: Self-pay | Admitting: Gastroenterology

## 2013-05-07 ENCOUNTER — Encounter: Payer: Self-pay | Admitting: Family Medicine

## 2013-05-07 NOTE — Telephone Encounter (Signed)
Pt was to see Dr. Deatra Ina prior to Colon due to some issues identified by previsit. Pt scheduled to see Dr. Deatra Ina tomorrow at 3:30pm, pt aware of appt.

## 2013-05-07 NOTE — Telephone Encounter (Signed)
Left message on pt's phone to call us for OV with [facility] before procedure date.

## 2013-05-08 ENCOUNTER — Ambulatory Visit (INDEPENDENT_AMBULATORY_CARE_PROVIDER_SITE_OTHER): Payer: 59 | Admitting: Gastroenterology

## 2013-05-08 ENCOUNTER — Telehealth: Payer: Self-pay | Admitting: Family Medicine

## 2013-05-08 ENCOUNTER — Encounter: Payer: Self-pay | Admitting: Gastroenterology

## 2013-05-08 VITALS — BP 108/64 | HR 70 | Ht 65.0 in | Wt 206.0 lb

## 2013-05-08 DIAGNOSIS — R079 Chest pain, unspecified: Secondary | ICD-10-CM

## 2013-05-08 DIAGNOSIS — Z1211 Encounter for screening for malignant neoplasm of colon: Secondary | ICD-10-CM

## 2013-05-08 MED ORDER — METRONIDAZOLE 500 MG PO TABS: 500.0000 mg | ORAL_TABLET | Freq: Two times a day (BID) | ORAL | Status: DC

## 2013-05-08 NOTE — Patient Instructions (Signed)
We are going to cancel your colonoscopy at this time per Dr Deatra Ina Please contact the office when you are ready to reschedule after your cardiac workup

## 2013-05-08 NOTE — Assessment & Plan Note (Signed)
Cardiac ischemia must be ruled out.  Should this not be cardiac chest pain, other possibilities include pain from chronic cholecystitis, and less likely esophageal reflux.  Recommendations #1 await cardiac evaluation; if negative she should have an abdominal ultrasound #2 if ultrasound is negative and patient has recurrent chest discomfort that is felt not to be of cardiac etiology I would consider PPI therapy and upper endoscopy

## 2013-05-08 NOTE — Progress Notes (Signed)
_                                                                                                                History of Present Illness: Pleasant 53 year old white female with diabetes, history of GERD referred for colorectal cancer screening.  She has no GI complaints including change of bowel habits, abdominal pain, melena or hematochezia.  She was recently seen by cardiology because of an episode of nonexertional chest pain.  This was described as moderately severe pain in her lower chest that may have radiated to the back.  It lasted several minutes.  She thinks that she had some jaw discomfort and pain in the right shoulder.  She's had similar but much less severe episodes of pain in the past.  Pain resolved spontaneously.  She is scheduled for a cardiac stress test.  There is no history of nausea or vomiting associated with the pain.  She's had pyrosis in the past but none recently.    Past Medical History  Diagnosis Date  . History of depression   . Diabetes mellitus     Type II, 3/07  . History of gastroesophageal reflux (GERD)   . History of hyperlipidemia   . History of migraine   . History of obesity     lap band surgery  . History of pneumonia 2005   Past Surgical History  Procedure Laterality Date  . Cesarean section    . Breast enhancement surgery    . Lumbar disc surgery    . Dental surgery    . Tongue surgery  03/2005    lesion removal  . Laparoscopic gastric banding  07/31/07   family history includes Alcohol abuse in her brother; Depression in her brother; Diabetes in her mother; Early menopause in her other; Kidney cancer in her mother; Lung cancer in her father; Pneumonia in her father. There is no history of Colon cancer, Pancreatic cancer, Rectal cancer, or Stomach cancer. Current Outpatient Prescriptions  Medication Sig Dispense Refill  . atorvastatin (LIPITOR) 10 MG tablet Take 1 tablet (10 mg total) by mouth daily.  90 tablet  3  .  escitalopram (LEXAPRO) 20 MG tablet TAKE 1 TABLET (20 MG TOTAL) BY MOUTH DAILY.  90 tablet  3  . ketoconazole (NIZORAL) 2 % cream Apply topically daily. To areas of face that are affected  60 g  0  . metaxalone (SKELAXIN) 800 MG tablet Take 1 tablet (800 mg total) by mouth 3 (three) times daily. As needed for back pain caution of sedation  30 tablet  0  . SUMAtriptan (IMITREX) 100 MG tablet Take 1 tablet (100 mg total) by mouth daily as needed for migraine.  36 tablet  1  . SUPREP BOWEL PREP SOLN Take 1 kit by mouth once.  354 mL  0   No current facility-administered medications for this visit.   Allergies as of 05/08/2013 - Review Complete 05/08/2013  Allergen Reaction Noted  . Meloxicam  02/10/2006  . Sulfonamide derivatives  02/10/2006    reports that she quit smoking about 8 years ago. She has never used smokeless tobacco. She reports that she drinks alcohol. She reports that she does not use illicit drugs.     Review of Systems: Pertinent positive and negative review of systems were noted in the above HPI section. All other review of systems were otherwise negative.  Vital signs were reviewed in today's medical record Physical Exam: General: Well developed , well nourished, no acute distress Skin: anicteric Head: Normocephalic and atraumatic Eyes:  sclerae anicteric, EOMI Ears: Normal auditory acuity Mouth: No deformity or lesions Neck: Supple, no masses or thyromegaly Lungs: Clear throughout to auscultation Heart: Regular rate and rhythm; no murmurs, rubs or bruits Abdomen: Soft, non tender and non distended. No masses, hepatosplenomegaly or hernias noted. Normal Bowel sounds Rectal:deferred Musculoskeletal: Symmetrical with no gross deformities  Skin: No lesions on visible extremities Pulses:  Normal pulses noted Extremities: No clubbing, cyanosis, edema or deformities noted Neurological: Alert oriented x 4, grossly nonfocal Cervical Nodes:  No significant cervical  adenopathy Inguinal Nodes: No significant inguinal adenopathy Psychological:  Alert and cooperative. Normal mood and affect  See Assessment and Plan under Problem List

## 2013-05-08 NOTE — Telephone Encounter (Signed)
?   If vaginitis -has odor in urine but neg cx tx for BV See mychart message Sent flagyl to her pharmacy

## 2013-05-08 NOTE — Assessment & Plan Note (Signed)
This will be postponed until the etiology of chest pain is determined.

## 2013-05-09 ENCOUNTER — Ambulatory Visit: Payer: 59 | Admitting: Cardiovascular Disease

## 2013-05-15 ENCOUNTER — Ambulatory Visit (HOSPITAL_COMMUNITY): Payer: 59 | Attending: Cardiology | Admitting: Radiology

## 2013-05-15 VITALS — BP 93/56 | Ht 65.0 in | Wt 200.0 lb

## 2013-05-15 DIAGNOSIS — R079 Chest pain, unspecified: Secondary | ICD-10-CM | POA: Insufficient documentation

## 2013-05-15 MED ORDER — TECHNETIUM TC 99M SESTAMIBI GENERIC - CARDIOLITE
30.0000 | Freq: Once | INTRAVENOUS | Status: AC | PRN
  Administered 2013-05-15: 30 via INTRAVENOUS

## 2013-05-15 MED ORDER — TECHNETIUM TC 99M SESTAMIBI GENERIC - CARDIOLITE
10.0000 | Freq: Once | INTRAVENOUS | Status: AC | PRN
  Administered 2013-05-15: 10 via INTRAVENOUS

## 2013-05-15 NOTE — Progress Notes (Signed)
[facility] [address] [place], [place] [address] [phone]    Cardiology Nuclear Med Study  Evelyn Shepherd is a 53 y.o. female     MRN : [id number]     DOB: 1961-01-11  Procedure Date: 05/15/2013  Nuclear Med Background Indication for Stress Test:  Evaluation for Ischemia History:No Known History of CAD Cardiac Risk Factors: Family History - CAD, History of Smoking, Lipids and NIDDM  Symptoms:  Chest Pain; jaw pain   Nuclear Pre-Procedure Caffeine/Decaff Intake:  None > 12 hrs NPO After: 0745 today   Lungs:  clear O2 Sat: 98% on room air. IV 0.9% NS with Angio Cath:  22g  IV Site: R Antecubital x 1, tolerated well IV Started by:  Irven Baltimore, RN  Chest Size (in):  42 Cup Size: D  Height: 5\' 5"  (1.651 m)  Weight:  200 lb (90.719 kg)  BMI:  Body mass index is 33.28 kg/(m^2). Tech Comments:  N/A    Nuclear Med Study 1 or 2 day study: 1 day  Stress Test Type:  Stress  Reading MD: N/A  Order Authorizing Provider:  Sherren Mocha, MD  Resting Radionuclide: Technetium 42m Sestamibi  Resting Radionuclide Dose: 11.0 mCi   Stress Radionuclide:  Technetium 52m Sestamibi  Stress Radionuclide Dose: 33.0 mCi           Stress Protocol Rest HR: 64 Stress HR: 150  Rest BP: 93/56 Stress BP: 140/74  Exercise Time (min): 5:00 METS: 7.00   Predicted Max HR: 168 bpm % Max HR: 89.29 bpm Rate Pressure Product: 21000   Dose of Adenosine (mg):  n/a Dose of Lexiscan: n/a mg  Dose of Atropine (mg): n/a Dose of Dobutamine: n/a mcg/kg/min (at max HR)  Stress Test Technologist: Perrin Maltese, EMT-P  Nuclear Technologist:  Charlton Amor, CNMT     Rest Procedure:  Myocardial perfusion imaging was performed at rest 45 minutes following the intravenous administration of Technetium 78m Sestamibi. Rest ECG: NSR with non-specific ST-T wave changes  Stress Procedure:  The patient exercised on the treadmill utilizing the Bruce Protocol for 5:00 minutes. The  patient stopped due to fatigue and denied any chest pain.  Technetium 82m Sestamibi was injected at peak exercise and myocardial perfusion imaging was performed after a brief delay. Stress ECG: No significant ST segment change suggestive of ischemia.  QPS Raw Data Images:  Normal; no motion artifact; normal heart/lung ratio. Stress Images:  Normal homogeneous uptake in all areas of the myocardium. Rest Images:  Normal homogeneous uptake in all areas of the myocardium. Subtraction (SDS):  No evidence of ischemia. Transient Ischemic Dilatation (Normal <1.22):  1.04 Lung/Heart Ratio (Normal <0.45):  0.38  Quantitative Gated Spect Images QGS EDV:  95 ml QGS ESV:  37 ml  Impression Exercise Capacity:  Fair exercise capacity. BP Response:  Normal blood pressure response. Clinical Symptoms:  The exercise was limited by fatigue. ECG Impression:  No significant ST segment change suggestive of ischemia. Comparison with Prior Nuclear Study: No previous nuclear study performed  Overall Impression:  Low risk stress nuclear study. Fair exercise tolerance. No ischemia or scar.  LV Ejection Fraction: 61%.  LV Wall Motion:  Normal Wall Motion  Darlin Coco MD

## 2013-05-17 ENCOUNTER — Encounter: Payer: 59 | Admitting: Gastroenterology

## 2013-05-22 ENCOUNTER — Encounter: Payer: Self-pay | Admitting: Cardiovascular Disease

## 2013-05-22 NOTE — Telephone Encounter (Signed)
This encounter was created in error - please disregard.

## 2013-05-22 NOTE — Telephone Encounter (Signed)
New message ° ° ° ° °Want stress test results °

## 2013-06-01 ENCOUNTER — Encounter: Payer: 59 | Admitting: Gastroenterology

## 2013-06-19 ENCOUNTER — Ambulatory Visit (HOSPITAL_BASED_OUTPATIENT_CLINIC_OR_DEPARTMENT_OTHER): Payer: 59 | Attending: Cardiovascular Disease | Admitting: Radiology

## 2013-06-19 VITALS — Ht 65.0 in | Wt 195.0 lb

## 2013-06-19 DIAGNOSIS — R0683 Snoring: Secondary | ICD-10-CM

## 2013-06-19 DIAGNOSIS — G4733 Obstructive sleep apnea (adult) (pediatric): Secondary | ICD-10-CM | POA: Insufficient documentation

## 2013-06-19 DIAGNOSIS — I4949 Other premature depolarization: Secondary | ICD-10-CM | POA: Insufficient documentation

## 2013-06-27 DIAGNOSIS — G4730 Sleep apnea, unspecified: Secondary | ICD-10-CM

## 2013-06-27 DIAGNOSIS — G4710 Hypersomnia, unspecified: Secondary | ICD-10-CM

## 2013-06-27 NOTE — Sleep Study (Signed)
   NAME: Evelyn Shepherd DATE OF BIRTH:  01/23/1960 MEDICAL RECORD NUMBER [id number]  LOCATION: [facility]  PHYSICIAN: [place] OF STUDY: 06/19/2013  SLEEP STUDY TYPE: Nocturnal Polysomnogram               REFERRING PHYSICIAN: Sherren Mocha, MD  INDICATION FOR STUDY: Hypersomnia with sleep apnea  EPWORTH SLEEPINESS SCORE:  14 HEIGHT: 5\' 5"  (165.1 cm)  WEIGHT: 195 lb (88.451 kg)    Body mass index is 32.45 kg/(m^2).  NECK SIZE: 14.5 in.  MEDICATIONS: Reviewed in the sleep record  SLEEP ARCHITECTURE: The patient had a total sleep time of 331 minutes with decreased slow-wave sleep and only 39 minutes of REM. Sleep onset latency was essentially normal, and REM onset was very prolonged.  Sleep efficiency was mildly reduced at 87%.  RESPIRATORY DATA: The patient was found to have 4 apneas and 55 obstructive hypopneas, giving her an AHI of 11 events per hour. The events occurred in all body positions, and there was moderate to loud snoring noted throughout. The patient did not meet split-night protocol secondary to her small numbers of events.  OXYGEN DATA: There was oxygen desaturation as low as 80% with the patient's events.  CARDIAC DATA: Occasional PVC noted  MOVEMENT/PARASOMNIA: Patient had no significant limb movements or abnormal behaviors seen.  IMPRESSION/ RECOMMENDATION:    1) mild obstructive sleep apnea/hypopnea syndrome, with an AHI of 11 events per hour and oxygen desaturation as low as 80%. Treatment for this degree of sleep apnea can include a trial of weight loss alone, upper airway surgery, dental appliance, and also CPAP. Clinical correlation is suggested.  2) occasional PVC noted, but no clinically significant arrhythmias were seen.     [place], American Board of Sleep Medicine  ELECTRONICALLY SIGNED ON:  06/27/2013, 5:11 PM [place] PH: [phone]   FX: [phone] [place]

## 2013-11-07 ENCOUNTER — Encounter: Payer: Self-pay | Admitting: Family Medicine

## 2013-11-07 ENCOUNTER — Ambulatory Visit (INDEPENDENT_AMBULATORY_CARE_PROVIDER_SITE_OTHER): Payer: 59 | Admitting: Family Medicine

## 2013-11-07 VITALS — BP 140/72 | HR 74 | Temp 98.4°F | Ht 65.0 in | Wt 197.2 lb

## 2013-11-07 DIAGNOSIS — L299 Pruritus, unspecified: Secondary | ICD-10-CM | POA: Insufficient documentation

## 2013-11-07 DIAGNOSIS — R5383 Other fatigue: Secondary | ICD-10-CM | POA: Insufficient documentation

## 2013-11-07 DIAGNOSIS — R609 Edema, unspecified: Secondary | ICD-10-CM

## 2013-11-07 DIAGNOSIS — R5382 Chronic fatigue, unspecified: Secondary | ICD-10-CM

## 2013-11-07 DIAGNOSIS — K118 Other diseases of salivary glands: Secondary | ICD-10-CM

## 2013-11-07 DIAGNOSIS — R600 Localized edema: Secondary | ICD-10-CM | POA: Insufficient documentation

## 2013-11-07 DIAGNOSIS — R739 Hyperglycemia, unspecified: Secondary | ICD-10-CM

## 2013-11-07 MED ORDER — CAPSAICIN 0.025 % EX CREA: TOPICAL_CREAM | Freq: Two times a day (BID) | CUTANEOUS | Status: DC

## 2013-11-07 NOTE — Progress Notes (Signed)
Pre visit review using our clinic review tool, if applicable. No additional management support is needed unless otherwise documented below in the visit note. 

## 2013-11-07 NOTE — Patient Instructions (Signed)
Try the capsaicin cream twice daily to itchy areas Keep cool  Avoid fragrances and smelly bath products and fabric softeners Let me know if no improvement Lab today for fatigue Watch the area under jawline-if bigger or painful let me know

## 2013-11-07 NOTE — Progress Notes (Signed)
Subjective:    Patient ID: Evelyn Shepherd, female    DOB: 29-Jan-1960, 53 y.o.   MRN: [id number]  HPI Here with several issues  Has itchy arms "prickly itching"  Since early sept  Started with L arm - deltoid area to elbow- now R arm also same area  It waxes and wanes -really bad  Started before vacation  No travel before that  No rash at all - just itching   Tried benadryl-oral Lotions and creams incl calamine   Tried nizoral cream - that works a bit   Looked up brachial/radial pruritis   Cold compresses are helpful   Looked up brachioradial pruritis -? The cause     Chemistry      Component Value Date/Time   NA 140 02/13/2013 0912   K 4.2 02/13/2013 0912   CL 108 02/13/2013 0912   CO2 28 02/13/2013 0912   BUN 13 02/13/2013 0912   CREATININE 0.7 02/13/2013 0912      Component Value Date/Time   CALCIUM 8.7 02/13/2013 0912   ALKPHOS 75 02/13/2013 0912   AST 20 02/13/2013 0912   ALT 28 02/13/2013 0912   BILITOT 0.5 02/13/2013 0912     neck does bother her a bit   Also has a knot on her neck R side under jaw line  2-3 weeks  Not hurting  No uri symptoms   Also having fatigue  No matter how much sleep she gets she is exhausted unrestorative sleep  She has been tested for sleep apnea -sleep study was ok   Patient Active Problem List   Diagnosis Date Noted  . Brachioradial pruritus 11/07/2013  . Fatigue 11/07/2013  . Submandibular gland swelling 11/07/2013  . Encounter for routine gynecological examination 04/11/2013  . Chest pain 04/11/2013  . UTI (urinary tract infection) 04/11/2013  . Plantar fasciitis 04/11/2013  . Colon cancer screening 02/13/2013  . Leg cramps 02/13/2013  . Other screening mammogram 04/13/2011  . Special screening for malignant neoplasms, colon 04/13/2011  . Seborrheic dermatitis 04/13/2011  . Routine general medical examination at a health care facility 03/25/2011  . AMENORRHEA 09/18/2008  . MENORRHAGIA 11/28/2007  . HYPOTHYROIDISM 08/11/2007  .  LOW BACK PAIN, CHRONIC 07/14/2006  . Hyperglycemia 07/06/2006  . HYPERLIPIDEMIA 07/06/2006  . DEPRESSION 07/06/2006  . COMMON MIGRAINE 07/06/2006  . GERD 07/06/2006   Past Medical History  Diagnosis Date  . History of depression   . Diabetes mellitus     Type II, 3/07  . History of gastroesophageal reflux (GERD)   . History of hyperlipidemia   . History of migraine   . History of obesity     lap band surgery  . History of pneumonia 2005   Past Surgical History  Procedure Laterality Date  . Cesarean section    . Breast enhancement surgery    . Lumbar disc surgery    . Dental surgery    . Tongue surgery  03/2005    lesion removal  . Laparoscopic gastric banding  07/31/07   History  Substance Use Topics  . Smoking status: Former Smoker    Quit date: 04/05/2005  . Smokeless tobacco: Never Used  . Alcohol Use: Yes     Comment: occasional   Family History  Problem Relation Age of Onset  . Kidney cancer Mother   . Diabetes Mother   . Pneumonia Father     died  . Lung cancer Father   . Depression Brother  commited suicide  . Alcohol abuse Brother   . Early menopause Other     family  . Colon cancer Neg Hx   . Pancreatic cancer Neg Hx   . Rectal cancer Neg Hx   . Stomach cancer Neg Hx    Allergies  Allergen Reactions  . Meloxicam     REACTION: unspecified  . Sulfonamide Derivatives     REACTION: unspecified   Current Outpatient Prescriptions on File Prior to Visit  Medication Sig Dispense Refill  . atorvastatin (LIPITOR) 10 MG tablet Take 1 tablet (10 mg total) by mouth daily.  90 tablet  3  . escitalopram (LEXAPRO) 20 MG tablet TAKE 1 TABLET (20 MG TOTAL) BY MOUTH DAILY.  90 tablet  3  . metaxalone (SKELAXIN) 800 MG tablet Take 1 tablet (800 mg total) by mouth 3 (three) times daily. As needed for back pain caution of sedation  30 tablet  0  . SUMAtriptan (IMITREX) 100 MG tablet Take 1 tablet (100 mg total) by mouth daily as needed for migraine.  36 tablet   1   No current facility-administered medications on file prior to visit.    Review of Systems Review of Systems  Constitutional: Negative for fever, appetite change,  and unexpected weight change. pos for fatigue  Eyes: Negative for pain and visual disturbance.  Respiratory: Negative for cough and shortness of breath.   Cardiovascular: Negative for cp or palpitations    Gastrointestinal: Negative for nausea, diarrhea and constipation.  Genitourinary: Negative for urgency and frequency.  Skin: Negative for pallor or rash  pos for itching on arms  Neurological: Negative for weakness, light-headedness, numbness and headaches.  Hematological: Negative for adenopathy. Does not bruise/bleed easily.  Psychiatric/Behavioral: Negative for dysphoric mood. The patient is not nervous/anxious.         Objective:   Physical Exam  Constitutional: She appears well-developed and well-nourished. No distress.  obese and well appearing   HENT:  Head: Normocephalic and atraumatic.  Right Ear: External ear normal.  Left Ear: External ear normal.  Nose: Nose normal.  Mouth/Throat: Oropharynx is clear and moist.  Eyes: Conjunctivae and EOM are normal. Pupils are equal, round, and reactive to light. Right eye exhibits no discharge. Left eye exhibits no discharge. No scleral icterus.  Neck: Normal range of motion. Neck supple. No JVD present. No thyromegaly present.  Mild palpable submandibular fullness on the R that is nontender and mobile    Cardiovascular: Normal rate, regular rhythm, normal heart sounds and intact distal pulses.  Exam reveals no gallop.   Pulmonary/Chest: Effort normal and breath sounds normal. No respiratory distress. She has no wheezes. She has no rales.  Abdominal: Soft. Bowel sounds are normal. She exhibits no distension and no mass. There is no tenderness.  Musculoskeletal: She exhibits no edema and no tenderness.  Lymphadenopathy:       Head (right side): Submandibular  adenopathy present.    She has no cervical adenopathy.    She has no axillary adenopathy.       Right: No inguinal, no supraclavicular and no epitrochlear adenopathy present.       Left: No inguinal, no supraclavicular and no epitrochlear adenopathy present.  Neurological: She is alert. She has normal reflexes. No cranial nerve deficit. She exhibits normal muscle tone. Coordination normal.  Skin: Skin is warm and dry. No rash noted. No erythema. No pallor.  No rash seen in areas of itching Solar lentigos diffusely   Psychiatric: She has a normal  mood and affect.          Assessment & Plan:   Problem List Items Addressed This Visit     Musculoskeletal and Integument   Brachioradial pruritus - Primary     Disc the condition and suspect it will be self limited  Trial of capsaicin cream  Adv to keep cool and avoid fragrances Update if not starting to improve in a week or if worsening        Other   Hyperglycemia     A1C ordered Disc imp of wt loss and low glycemic diet to avoid diabetes     Relevant Orders      Hemoglobin A1c (Completed)   Fatigue     Labs today  May be hormonal or linked to schedule  Disc sleep hygiene and habits    Relevant Orders      CBC with Differential (Completed)      Comprehensive metabolic panel (Completed)      TSH (Completed)      T4, Free (Completed)   Submandibular gland swelling     This is mild/vague on exam  No other symptoms No palpable adenopathy Will watch -if no resolution in the next 2-4 weeks consider ENT eval

## 2013-11-08 LAB — CBC WITH DIFFERENTIAL/PLATELET
Basophils Absolute: 0.1 10*3/uL (ref 0.0–0.1)
Basophils Relative: 0.8 % (ref 0.0–3.0)
EOS ABS: 0.1 10*3/uL (ref 0.0–0.7)
Eosinophils Relative: 1.9 % (ref 0.0–5.0)
HCT: 36.3 % (ref 36.0–46.0)
Hemoglobin: 12.2 g/dL (ref 12.0–15.0)
LYMPHS ABS: 3 10*3/uL (ref 0.7–4.0)
LYMPHS PCT: 44.9 % (ref 12.0–46.0)
MCHC: 33.5 g/dL (ref 30.0–36.0)
MCV: 86.9 fl (ref 78.0–100.0)
Monocytes Absolute: 0.3 10*3/uL (ref 0.1–1.0)
Monocytes Relative: 4.7 % (ref 3.0–12.0)
NEUTROS ABS: 3.2 10*3/uL (ref 1.4–7.7)
Neutrophils Relative %: 47.7 % (ref 43.0–77.0)
Platelets: 249 10*3/uL (ref 150.0–400.0)
RBC: 4.18 Mil/uL (ref 3.87–5.11)
RDW: 13 % (ref 11.5–15.5)
WBC: 6.7 10*3/uL (ref 4.0–10.5)

## 2013-11-08 LAB — COMPREHENSIVE METABOLIC PANEL
ALT: 15 U/L (ref 0–35)
AST: 18 U/L (ref 0–37)
Albumin: 3.7 g/dL (ref 3.5–5.2)
Alkaline Phosphatase: 70 U/L (ref 39–117)
BUN: 20 mg/dL (ref 6–23)
CHLORIDE: 105 meq/L (ref 96–112)
CO2: 24 meq/L (ref 19–32)
CREATININE: 0.8 mg/dL (ref 0.4–1.2)
Calcium: 9.2 mg/dL (ref 8.4–10.5)
GFR: 80.87 mL/min (ref >60.00)
Glucose, Bld: 87 mg/dL (ref 70–99)
Potassium: 4.6 mEq/L (ref 3.5–5.1)
Sodium: 137 mEq/L (ref 135–145)
Total Bilirubin: 0.6 mg/dL (ref 0.2–1.2)
Total Protein: 7.2 g/dL (ref 6.0–8.3)

## 2013-11-08 LAB — T4, FREE: FREE T4: 0.91 ng/dL (ref 0.60–1.60)

## 2013-11-08 LAB — TSH: TSH: 1.72 u[IU]/mL (ref 0.35–4.50)

## 2013-11-08 LAB — HEMOGLOBIN A1C: HEMOGLOBIN A1C: 5.9 % (ref 4.6–6.5)

## 2013-11-08 NOTE — Assessment & Plan Note (Signed)
Labs today  May be hormonal or linked to schedule  Disc sleep hygiene and habits

## 2013-11-08 NOTE — Assessment & Plan Note (Signed)
This is mild/vague on exam  No other symptoms No palpable adenopathy Will watch -if no resolution in the next 2-4 weeks consider ENT eval

## 2013-11-08 NOTE — Assessment & Plan Note (Signed)
A1C ordered Disc imp of wt loss and low glycemic diet to avoid diabetes

## 2013-11-08 NOTE — Assessment & Plan Note (Signed)
Disc the condition and suspect it will be self limited  Trial of capsaicin cream  Adv to keep cool and avoid fragrances Update if not starting to improve in a week or if worsening

## 2013-12-24 ENCOUNTER — Encounter: Payer: Self-pay | Admitting: Family Medicine

## 2013-12-26 ENCOUNTER — Encounter: Payer: Self-pay | Admitting: Family Medicine

## 2013-12-26 ENCOUNTER — Ambulatory Visit (INDEPENDENT_AMBULATORY_CARE_PROVIDER_SITE_OTHER): Payer: 59 | Admitting: Family Medicine

## 2013-12-26 VITALS — BP 112/70 | HR 68 | Temp 98.4°F | Ht 65.0 in | Wt 199.8 lb

## 2013-12-26 DIAGNOSIS — M545 Low back pain, unspecified: Secondary | ICD-10-CM | POA: Insufficient documentation

## 2013-12-26 DIAGNOSIS — J069 Acute upper respiratory infection, unspecified: Secondary | ICD-10-CM

## 2013-12-26 DIAGNOSIS — M5442 Lumbago with sciatica, left side: Secondary | ICD-10-CM

## 2013-12-26 MED ORDER — TRAMADOL HCL 50 MG PO TABS: 50.0000 mg | ORAL_TABLET | Freq: Three times a day (TID) | ORAL | Status: DC | PRN

## 2013-12-26 NOTE — Progress Notes (Signed)
Subjective:    Patient ID: Evelyn Shepherd, female    DOB: [birth year], 53 y.o.   MRN: [id number]  HPI Here for uri symptoms and back pain  Had symptoms since early December -- started with ST and loss of sense of taste  Then malaise and exhaustion  Then uri symptoms- incl cong and cough and ear pain   R ear feels funny/ popping  Cough is improved -not much production  Never had a fever    Back pain  Started with L hip pain - outer leg  Now it has moved into lower back --- shooting down both legs  Worse with standing or after walking for a bit  Sharp and dull pain  A little tingling in L leg -no loss of strength   Could not sleep/ not comfortable  Had to lie on bedroom floor  Is using skelaxin and nsaid   Hx of surgery- L4 and L5 ruptured disc (no fusion)   Patient Active Problem List   Diagnosis Date Noted  . Brachioradial pruritus 11/07/2013  . Fatigue 11/07/2013  . Submandibular gland swelling 11/07/2013  . Encounter for routine gynecological examination 04/11/2013  . Chest pain 04/11/2013  . UTI (urinary tract infection) 04/11/2013  . Plantar fasciitis 04/11/2013  . Colon cancer screening 02/13/2013  . Leg cramps 02/13/2013  . Other screening mammogram 04/13/2011  . Special screening for malignant neoplasms, colon 04/13/2011  . Seborrheic dermatitis 04/13/2011  . Routine general medical examination at a health care facility 03/25/2011  . AMENORRHEA 09/18/2008  . MENORRHAGIA 11/28/2007  . HYPOTHYROIDISM 08/11/2007  . LOW BACK PAIN, CHRONIC 07/14/2006  . Hyperglycemia 07/06/2006  . HYPERLIPIDEMIA 07/06/2006  . DEPRESSION 07/06/2006  . COMMON MIGRAINE 07/06/2006  . GERD 07/06/2006   Past Medical History  Diagnosis Date  . History of depression   . Diabetes mellitus     Type II, 3/07  . History of gastroesophageal reflux (GERD)   . History of hyperlipidemia   . History of migraine   . History of obesity     lap band surgery  . History of pneumonia 2005    Past Surgical History  Procedure Laterality Date  . Cesarean section    . Breast enhancement surgery    . Lumbar disc surgery    . Dental surgery    . Tongue surgery  03/2005    lesion removal  . Laparoscopic gastric banding  07/31/07   History  Substance Use Topics  . Smoking status: Former Smoker    Quit date: 04/05/2005  . Smokeless tobacco: Never Used  . Alcohol Use: 0.0 oz/week    0 Not specified per week     Comment: occasional   Family History  Problem Relation Age of Onset  . Kidney cancer Mother   . Diabetes Mother   . Pneumonia Father     died  . Lung cancer Father   . Depression Brother     commited suicide  . Alcohol abuse Brother   . Early menopause Other     family  . Colon cancer Neg Hx   . Pancreatic cancer Neg Hx   . Rectal cancer Neg Hx   . Stomach cancer Neg Hx    Allergies  Allergen Reactions  . Meloxicam     REACTION: unspecified  . Sulfonamide Derivatives     REACTION: unspecified   Current Outpatient Prescriptions on File Prior to Visit  Medication Sig Dispense Refill  . atorvastatin (LIPITOR) 10 MG tablet  Take 1 tablet (10 mg total) by mouth daily. 90 tablet 3  . capsaicin (ZOSTRIX) 0.025 % cream Apply topically 2 (two) times daily. Apply to affected areas twice daily 60 g 1  . escitalopram (LEXAPRO) 20 MG tablet TAKE 1 TABLET (20 MG TOTAL) BY MOUTH DAILY. 90 tablet 3  . ketoconazole (NIZORAL) 2 % cream Apply 1 application topically daily as needed for irritation.    . metaxalone (SKELAXIN) 800 MG tablet Take 1 tablet (800 mg total) by mouth 3 (three) times daily. As needed for back pain caution of sedation 30 tablet 0  . SUMAtriptan (IMITREX) 100 MG tablet Take 1 tablet (100 mg total) by mouth daily as needed for migraine. 36 tablet 1   No current facility-administered medications on file prior to visit.     Review of Systems Review of Systems  Constitutional: Negative for fever, appetite change, fatigue and unexpected weight  change.  ENT pos for congestion /drainage and neg for sinus pain  Eyes: Negative for pain and visual disturbance.  Respiratory: Negative for wheeze and shortness of breath.   Cardiovascular: Negative for cp or palpitations    Gastrointestinal: Negative for nausea, diarrhea and constipation.  Genitourinary: Negative for urgency and frequency.  Skin: Negative for pallor or rash   MSK pos for low back pain  Neurological: Negative for weakness, light-headedness, numbness and headaches.  Hematological: Negative for adenopathy. Does not bruise/bleed easily.  Psychiatric/Behavioral: Negative for dysphoric mood. The patient is not nervous/anxious.         Objective:   Physical Exam  Constitutional: She appears well-developed and well-nourished. No distress.  obese and well appearing    HENT:  Head: Normocephalic and atraumatic.  Right Ear: External ear normal.  Left Ear: External ear normal.  Mouth/Throat: Oropharynx is clear and moist. No oropharyngeal exudate.  Nares are injected and congested  No sinus tenderness Scant clear rhinorrhea   Eyes: Conjunctivae and EOM are normal. Pupils are equal, round, and reactive to light. Right eye exhibits no discharge. Left eye exhibits no discharge.  Neck: Normal range of motion. Neck supple.  Cardiovascular: Normal rate, regular rhythm and normal heart sounds.   Pulmonary/Chest: Effort normal and breath sounds normal. No respiratory distress. She has no wheezes. She has no rales.  Musculoskeletal: She exhibits tenderness. She exhibits no edema.       Lumbar back: She exhibits decreased range of motion, tenderness, bony tenderness and spasm. She exhibits no edema.  Tender over upper LS and also L piriformis area  Neg SLR Nl rom hips reprod of pain on L piriformis stretch Nl gait  Limited ext of spine and lateral bend    No neuro changes   Lymphadenopathy:    She has no cervical adenopathy.  Neurological: She is alert. She has normal  strength and normal reflexes. She displays no atrophy. No sensory deficit. She exhibits normal muscle tone.  Skin: Skin is warm and dry. No rash noted. No erythema.  Psychiatric: She has a normal mood and affect.          Assessment & Plan:   Problem List Items Addressed This Visit      Respiratory   Viral URI - Primary    Getting better Reassuring exam Disc symptomatic care - see instructions on AVS  Update if not starting to improve in a week or if worsening        Other   Low back pain    With prior LS surgery but no  neuro s/s  Some piriformis discomfort on the L  Will continue nsaid and muscle relaxer Heat prn Tramadol prn for more severe pain  Consider further eval if no improvement Handout given for piriformis stretches     Relevant Medications      traMADol (ULTRAM) tablet 50 mg

## 2013-12-26 NOTE — Patient Instructions (Addendum)
Either take ibuprofen 800 mg or mobic - whichever works better but not both  Take with food  Continue the muscle relaxer as needed  Try tramadol for more severe pain - use with caution   If not improving over the next week -let me know (would consider physical therapy)  Do evaluate your mattress situation

## 2013-12-26 NOTE — Progress Notes (Signed)
Pre visit review using our clinic review tool, if applicable. No additional management support is needed unless otherwise documented below in the visit note. 

## 2013-12-27 NOTE — Assessment & Plan Note (Signed)
With prior LS surgery but no neuro s/s  Some piriformis discomfort on the L  Will continue nsaid and muscle relaxer Heat prn Tramadol prn for more severe pain  Consider further eval if no improvement Handout given for piriformis stretches

## 2013-12-27 NOTE — Assessment & Plan Note (Signed)
Getting better Reassuring exam Disc symptomatic care - see instructions on AVS  Update if not starting to improve in a week or if worsening

## 2014-02-11 ENCOUNTER — Other Ambulatory Visit: Payer: Self-pay | Admitting: Family Medicine

## 2014-02-20 ENCOUNTER — Telehealth: Payer: Self-pay | Admitting: Family Medicine

## 2014-02-20 NOTE — Telephone Encounter (Signed)
Left voicemail for patient to call back. 

## 2014-02-20 NOTE — Telephone Encounter (Signed)
Patient Name: Evelyn Shepherd DOB: 1960-05-06 Initial Comment Caller states she has an headache and she has a cough with chills. Nasal congestion as well. Sneezing. Nurse Assessment Nurse: Julien Girt, RN, Almyra Free Date/Time Eilene Ghazi Time): 02/20/2014 2:37:35 PM Confirm and document reason for call. If symptomatic, describe symptoms. ---Caller states she began to feel bad on Monday, cough, headache, nasal congestion, sneezing and might have fever today. Has the patient traveled out of the country within the last 30 days? ---Not Applicable Does the patient require triage? ---Yes Related visit to physician within the last 2 weeks? ---No Does the PT have any chronic conditions? (i.e. diabetes, asthma, etc.) ---Yes List chronic conditions. ---hx migraines, Did the patient indicate they were pregnant? ---No Guidelines Guideline Title Affirmed Question Affirmed Notes Sinus Pain or Congestion [1] Redness or swelling on the cheek, forehead or around the eye AND [2] fever Final Disposition User Go to ED Now (or PCP triage) Julien Girt, RN, Almyra Free Comments Caller refused ED outcome, requests an appt for tomorrow. Advised, per profile, that I would make a note in her record and she will receive a call back. Adds she is willing to go to an UC

## 2014-02-20 NOTE — Telephone Encounter (Signed)
Please schedule with first avail -I am not in the office tomorrow Thanks  UC if nothing avail

## 2014-02-21 NOTE — Telephone Encounter (Signed)
Left voicemail for patient to call back. 

## 2014-02-25 ENCOUNTER — Encounter: Payer: Self-pay | Admitting: *Deleted

## 2014-02-25 NOTE — Telephone Encounter (Signed)
Been trying to call this patient since the 02/20/14. Please advise.

## 2014-02-25 NOTE — Telephone Encounter (Signed)
Letter send to patient just checking to see how she is feeling.

## 2014-02-25 NOTE — Telephone Encounter (Signed)
If she is not reachable at any of her numbers then please send a letter - just to check in /see how she is feeling and get new contact info if necessary Thanks

## 2014-05-02 ENCOUNTER — Other Ambulatory Visit: Payer: Self-pay | Admitting: Family Medicine

## 2014-05-02 NOTE — Telephone Encounter (Signed)
done

## 2014-05-02 NOTE — Telephone Encounter (Signed)
Please refill for a year thanks 

## 2014-05-02 NOTE — Telephone Encounter (Signed)
Electronic refill request, last refilled on 02/13/13 #90 with 3 additional refills

## 2014-11-14 ENCOUNTER — Telehealth: Payer: Self-pay | Admitting: Family Medicine

## 2014-11-14 DIAGNOSIS — R739 Hyperglycemia, unspecified: Secondary | ICD-10-CM

## 2014-11-14 DIAGNOSIS — Z0000 Encounter for general adult medical examination without abnormal findings: Secondary | ICD-10-CM

## 2014-11-14 NOTE — Telephone Encounter (Signed)
-----   Message from Ellamae Sia sent at 11/12/2014  8:37 AM EDT ----- Regarding: Lab orders for Friday, 11.4.16 Patient is scheduled for CPX labs, please order future labs, Thanks , Karna Christmas

## 2014-11-15 ENCOUNTER — Other Ambulatory Visit (INDEPENDENT_AMBULATORY_CARE_PROVIDER_SITE_OTHER): Payer: 59

## 2014-11-15 DIAGNOSIS — Z0000 Encounter for general adult medical examination without abnormal findings: Secondary | ICD-10-CM | POA: Diagnosis not present

## 2014-11-15 DIAGNOSIS — R739 Hyperglycemia, unspecified: Secondary | ICD-10-CM | POA: Diagnosis not present

## 2014-11-15 LAB — CBC WITH DIFFERENTIAL/PLATELET
BASOS ABS: 0 10*3/uL (ref 0.0–0.1)
Basophils Relative: 0.9 % (ref 0.0–3.0)
EOS ABS: 0.2 10*3/uL (ref 0.0–0.7)
Eosinophils Relative: 3.2 % (ref 0.0–5.0)
HCT: 37.6 % (ref 36.0–46.0)
Hemoglobin: 12.6 g/dL (ref 12.0–15.0)
LYMPHS ABS: 2.1 10*3/uL (ref 0.7–4.0)
Lymphocytes Relative: 44.7 % (ref 12.0–46.0)
MCHC: 33.6 g/dL (ref 30.0–36.0)
MCV: 86.2 fl (ref 78.0–100.0)
MONOS PCT: 5.7 % (ref 3.0–12.0)
Monocytes Absolute: 0.3 10*3/uL (ref 0.1–1.0)
NEUTROS PCT: 45.5 % (ref 43.0–77.0)
Neutro Abs: 2.2 10*3/uL (ref 1.4–7.7)
Platelets: 227 10*3/uL (ref 150.0–400.0)
RBC: 4.37 Mil/uL (ref 3.87–5.11)
RDW: 13.8 % (ref 11.5–15.5)
WBC: 4.8 10*3/uL (ref 4.0–10.5)

## 2014-11-15 LAB — COMPREHENSIVE METABOLIC PANEL
ALK PHOS: 57 U/L (ref 39–117)
ALT: 14 U/L (ref 0–35)
AST: 16 U/L (ref 0–37)
Albumin: 4 g/dL (ref 3.5–5.2)
BUN: 17 mg/dL (ref 6–23)
CO2: 30 meq/L (ref 19–32)
CREATININE: 0.88 mg/dL (ref 0.40–1.20)
Calcium: 9.5 mg/dL (ref 8.4–10.5)
Chloride: 105 mEq/L (ref 96–112)
GFR: 71.13 mL/min (ref >60.00)
GLUCOSE: 107 mg/dL — AB (ref 70–99)
Potassium: 4.5 mEq/L (ref 3.5–5.1)
Sodium: 141 mEq/L (ref 135–145)
TOTAL PROTEIN: 6.7 g/dL (ref 6.0–8.3)
Total Bilirubin: 0.5 mg/dL (ref 0.2–1.2)

## 2014-11-15 LAB — LIPID PANEL
Cholesterol: 240 mg/dL — ABNORMAL HIGH (ref 0–200)
HDL: 52.3 mg/dL (ref >39.00)
LDL CALC: 164 mg/dL — AB (ref 0–99)
NONHDL: 188.06
Total CHOL/HDL Ratio: 5
Triglycerides: 122 mg/dL (ref 0.0–149.0)
VLDL: 24.4 mg/dL (ref 0.0–40.0)

## 2014-11-15 LAB — TSH: TSH: 3.04 u[IU]/mL (ref 0.35–4.50)

## 2014-11-15 LAB — HEMOGLOBIN A1C: Hgb A1c MFr Bld: 5.8 % (ref 4.6–6.5)

## 2014-11-20 ENCOUNTER — Ambulatory Visit (INDEPENDENT_AMBULATORY_CARE_PROVIDER_SITE_OTHER): Payer: 59 | Admitting: Family Medicine

## 2014-11-20 ENCOUNTER — Encounter: Payer: Self-pay | Admitting: Family Medicine

## 2014-11-20 VITALS — BP 112/68 | HR 72 | Temp 98.8°F | Ht 65.25 in | Wt 214.5 lb

## 2014-11-20 DIAGNOSIS — Z0000 Encounter for general adult medical examination without abnormal findings: Secondary | ICD-10-CM | POA: Diagnosis not present

## 2014-11-20 DIAGNOSIS — R739 Hyperglycemia, unspecified: Secondary | ICD-10-CM

## 2014-11-20 DIAGNOSIS — Z1211 Encounter for screening for malignant neoplasm of colon: Secondary | ICD-10-CM

## 2014-11-20 DIAGNOSIS — E039 Hypothyroidism, unspecified: Secondary | ICD-10-CM

## 2014-11-20 DIAGNOSIS — E785 Hyperlipidemia, unspecified: Secondary | ICD-10-CM

## 2014-11-20 MED ORDER — ATORVASTATIN CALCIUM 10 MG PO TABS: 10.0000 mg | ORAL_TABLET | Freq: Every day | ORAL | Status: DC

## 2014-11-20 NOTE — Progress Notes (Signed)
Pre visit review using our clinic review tool, if applicable. No additional management support is needed unless otherwise documented below in the visit note. 

## 2014-11-20 NOTE — Progress Notes (Signed)
Subjective:    Patient ID: Evelyn Shepherd, female    DOB: [birth year], 55 y.o.   MRN: [id number]  HPI  Here for health maintenance exam and to review chronic medical problems    Has some aches and pains  Last few weeks - keeping a headache / little st this am (did take an imitrex this am)- helped  Pain in upper back in between shoulders and neck - painful sitting at desk  L side of her leg stays numb /stabbing pain or sharp (lower back feels fine)  R foot - ? Plantar fasciitis - pain after inactivity in the evening - back of the heel -no trauma and no swelling     Wt is up 15 lb with bmi of 35 Eating habits have not changed  She started going to weight watchers at work -and has lost 4 lb  Does not think her gastric band is a problem  Not exercising currently  Would like to walk more   Hep C/HIV screening = has been screened already 2 y ago and she is low risk   No more menses  No hot flashes at this point   Colon cancer screen -has not had a colonoscopy yet  Had one scheduled in the past -had to cancel it due to CP that is better  Wants a referral   Flu shot-had it at the hospital   Mm 3/15 nl - had one set up and had to cancel it -has not re scheduled it yet  Self exam  Td 1/08  Pap 4/15 neg No gyn problems No sexual partners   Results for orders placed or performed in visit on 11/15/14  CBC with Differential/Platelet  Result Value Ref Range   WBC 4.8 4.0 - 10.5 K/uL   RBC 4.37 3.87 - 5.11 Mil/uL   Hemoglobin 12.6 12.0 - 15.0 g/dL   HCT 37.6 36.0 - 46.0 %   MCV 86.2 78.0 - 100.0 fl   MCHC 33.6 30.0 - 36.0 g/dL   RDW 13.8 11.5 - 15.5 %   Platelets 227.0 150.0 - 400.0 K/uL   Neutrophils Relative % 45.5 43.0 - 77.0 %   Lymphocytes Relative 44.7 12.0 - 46.0 %   Monocytes Relative 5.7 3.0 - 12.0 %   Eosinophils Relative 3.2 0.0 - 5.0 %   Basophils Relative 0.9 0.0 - 3.0 %   Neutro Abs 2.2 1.4 - 7.7 K/uL   Lymphs Abs 2.1 0.7 - 4.0 K/uL   Monocytes Absolute 0.3  0.1 - 1.0 K/uL   Eosinophils Absolute 0.2 0.0 - 0.7 K/uL   Basophils Absolute 0.0 0.0 - 0.1 K/uL  Comprehensive metabolic panel  Result Value Ref Range   Sodium 141 135 - 145 mEq/L   Potassium 4.5 3.5 - 5.1 mEq/L   Chloride 105 96 - 112 mEq/L   CO2 30 19 - 32 mEq/L   Glucose, Bld 107 (H) 70 - 99 mg/dL   BUN 17 6 - 23 mg/dL   Creatinine, Ser 0.88 0.40 - 1.20 mg/dL   Total Bilirubin 0.5 0.2 - 1.2 mg/dL   Alkaline Phosphatase 57 39 - 117 U/L   AST 16 0 - 37 U/L   ALT 14 0 - 35 U/L   Total Protein 6.7 6.0 - 8.3 g/dL   Albumin 4.0 3.5 - 5.2 g/dL   Calcium 9.5 8.4 - 10.5 mg/dL   GFR 71.13 >60.00 mL/min  Hemoglobin A1c  Result Value Ref Range   Hgb A1c  MFr Bld 5.8 4.6 - 6.5 %  Lipid panel  Result Value Ref Range   Cholesterol 240 (H) 0 - 200 mg/dL   Triglycerides 122.0 0.0 - 149.0 mg/dL   HDL 52.30 >39.00 mg/dL   VLDL 24.4 0.0 - 40.0 mg/dL   LDL Cholesterol 164 (H) 0 - 99 mg/dL   Total CHOL/HDL Ratio 5    NonHDL 188.06   TSH  Result Value Ref Range   TSH 3.04 0.35 - 4.50 uIU/mL    A1c- 5.8 , down from 5.9 - reassuring   LDL is up again - open to going back on lipitor   Patient Active Problem List   Diagnosis Date Noted  . Viral URI 12/26/2013  . Low back pain 12/26/2013  . Brachioradial pruritus 11/07/2013  . Fatigue 11/07/2013  . Submandibular gland swelling 11/07/2013  . Encounter for routine gynecological examination 04/11/2013  . Chest pain 04/11/2013  . UTI (urinary tract infection) 04/11/2013  . Plantar fasciitis 04/11/2013  . Colon cancer screening 02/13/2013  . Leg cramps 02/13/2013  . Other screening mammogram 04/13/2011  . Special screening for malignant neoplasms, colon 04/13/2011  . Seborrheic dermatitis 04/13/2011  . Routine general medical examination at a health care facility 03/25/2011  . AMENORRHEA 09/18/2008  . MENORRHAGIA 11/28/2007  . Hypothyroidism 08/11/2007  . LOW BACK PAIN, CHRONIC 07/14/2006  . Hyperglycemia 07/06/2006  . Hyperlipidemia  07/06/2006  . DEPRESSION 07/06/2006  . COMMON MIGRAINE 07/06/2006  . GERD 07/06/2006   Past Medical History  Diagnosis Date  . History of depression   . Diabetes mellitus ([place])     Type II, 3/07  . History of gastroesophageal reflux (GERD)   . History of hyperlipidemia   . History of migraine   . History of obesity     lap band surgery  . History of pneumonia 2005   Past Surgical History  Procedure Laterality Date  . Cesarean section    . Breast enhancement surgery    . Lumbar disc surgery    . Dental surgery    . Tongue surgery  03/2005    lesion removal  . Laparoscopic gastric banding  07/31/07   Social History  Substance Use Topics  . Smoking status: Former Smoker    Quit date: 04/05/2005  . Smokeless tobacco: Never Used  . Alcohol Use: 0.0 oz/week    0 Standard drinks or equivalent per week     Comment: occasional   Family History  Problem Relation Age of Onset  . Kidney cancer Mother   . Diabetes Mother   . Pneumonia Father     died  . Lung cancer Father   . Depression Brother     commited suicide  . Alcohol abuse Brother   . Early menopause Other     family  . Colon cancer Neg Hx   . Pancreatic cancer Neg Hx   . Rectal cancer Neg Hx   . Stomach cancer Neg Hx    Allergies  Allergen Reactions  . Meloxicam     REACTION: unspecified  . Sulfonamide Derivatives     REACTION: unspecified   Current Outpatient Prescriptions on File Prior to Visit  Medication Sig Dispense Refill  . capsaicin (ZOSTRIX) 0.025 % cream Apply topically 2 (two) times daily. Apply to affected areas twice daily 60 g 1  . escitalopram (LEXAPRO) 20 MG tablet TAKE 1 TABLET BY MOUTH ONCE DAILY 90 tablet 3  . ketoconazole (NIZORAL) 2 % cream Apply 1 application topically daily as  needed for irritation.    . metaxalone (SKELAXIN) 800 MG tablet Take 1 tablet (800 mg total) by mouth 3 (three) times daily. As needed for back pain caution of sedation 30 tablet 0  . SUMAtriptan (IMITREX)  100 MG tablet TAKE 1 TABLET BY MOUTH DAILY AS NEEDED FOR MIGRAINE. 36 tablet 11   No current facility-administered medications on file prior to visit.       Review of Systems Review of Systems  Constitutional: Negative for fever, appetite change, fatigue and unexpected weight change.  Eyes: Negative for pain and visual disturbance.  ENT pos for rhinorrhea/allergic  Respiratory: Negative for cough and shortness of breath.   Cardiovascular: Negative for cp or palpitations    Gastrointestinal: Negative for nausea, diarrhea and constipation.  Genitourinary: Negative for urgency and frequency.  MSK pos for increasing aches and pains in joints and muscles/ neg for joint swelling or redness Skin: Negative for pallor or rash   Neurological: Negative for weakness, light-headedness, numbness and pos for  headaches.  Hematological: Negative for adenopathy. Does not bruise/bleed easily.  Psychiatric/Behavioral: Negative for dysphoric mood. The patient is not nervous/anxious.         Objective:   Physical Exam  Constitutional: She appears well-developed and well-nourished. No distress.  obese and well appearing   HENT:  Head: Normocephalic and atraumatic.  Right Ear: External ear normal.  Left Ear: External ear normal.  Mouth/Throat: Oropharynx is clear and moist.  Nares are boggy and mildly congested  No sinus tenderness   Eyes: Conjunctivae and EOM are normal. Pupils are equal, round, and reactive to light. No scleral icterus.  Neck: Normal range of motion. Neck supple. No JVD present. Carotid bruit is not present. No thyromegaly present.  Cardiovascular: Normal rate, regular rhythm, normal heart sounds and intact distal pulses.  Exam reveals no gallop.   Pulmonary/Chest: Effort normal and breath sounds normal. No respiratory distress. She has no wheezes. She exhibits no tenderness.  Abdominal: Soft. Bowel sounds are normal. She exhibits no distension, no abdominal bruit and no mass.  There is no tenderness.  Genitourinary: No breast swelling, tenderness, discharge or bleeding.  Breast exam: No mass, nodules, thickening, tenderness, bulging, retraction, inflamation, nipple discharge or skin changes noted.  No axillary or clavicular LA.      Musculoskeletal: Normal range of motion. She exhibits no edema or tenderness.  Lymphadenopathy:    She has no cervical adenopathy.  Neurological: She is alert. She has normal reflexes. No cranial nerve deficit. She exhibits normal muscle tone. Coordination normal.  Skin: Skin is warm and dry. No rash noted. No erythema. No pallor.  Psychiatric: She has a normal mood and affect.          Assessment & Plan:   Problem List Items Addressed This Visit      Endocrine   Hypothyroidism    Hypothyroidism  Pt has no clinical changes No change in energy level/ hair or skin/ edema and no tremor Lab Results  Component Value Date   TSH 3.04 11/15/2014            Other   Colon cancer screening    Ref for screening colonoscopy      Relevant Orders   Ambulatory referral to Gastroenterology   Hyperglycemia    Lab Results  Component Value Date   HGBA1C 5.8 11/15/2014   Stable Enc wt loss and low glycemic diet       Hyperlipidemia    Disc goals for lipids and  reasons to control them Rev labs with pt Rev low sat fat diet in detail Pt will re start atorvastatin and update if side eff or problems       Relevant Medications   atorvastatin (LIPITOR) 10 MG tablet   Routine general medical examination at a health care facility - Primary    Reviewed health habits including diet and exercise and skin cancer prevention Reviewed appropriate screening tests for age  Also reviewed health mt list, fam hx and immunization status , as well as social and family history   See HPI Labs reviewed  Ref for screening colonoscopy  Enc wt loss    Re start lipitor and watch diet (Avoid red meat/ fried foods/ egg yolks/ fatty breakfast  meats/ butter, cheese and high fat dairy/ and shellfish)

## 2014-11-20 NOTE — Patient Instructions (Signed)
Stop at check out for referral for colonoscopy  Re start lipitor and watch diet (Avoid red meat/ fried foods/ egg yolks/ fatty breakfast meats/ butter, cheese and high fat dairy/ and shellfish) Get back on flonase for the allergy season -this may prevent headaches

## 2014-11-21 NOTE — Assessment & Plan Note (Signed)
Hypothyroidism  Pt has no clinical changes No change in energy level/ hair or skin/ edema and no tremor Lab Results  Component Value Date   TSH 3.04 11/15/2014

## 2014-11-21 NOTE — Assessment & Plan Note (Signed)
Reviewed health habits including diet and exercise and skin cancer prevention Reviewed appropriate screening tests for age  Also reviewed health mt list, fam hx and immunization status , as well as social and family history   See HPI Labs reviewed  Ref for screening colonoscopy  Enc wt loss    Re start lipitor and watch diet (Avoid red meat/ fried foods/ egg yolks/ fatty breakfast meats/ butter, cheese and high fat dairy/ and shellfish)

## 2014-11-21 NOTE — Assessment & Plan Note (Signed)
Lab Results  Component Value Date   HGBA1C 5.8 11/15/2014   Stable Enc wt loss and low glycemic diet

## 2014-11-21 NOTE — Assessment & Plan Note (Signed)
Ref for screening colonoscopy 

## 2014-11-21 NOTE — Assessment & Plan Note (Signed)
Disc goals for lipids and reasons to control them Rev labs with pt Rev low sat fat diet in detail Pt will re start atorvastatin and update if side eff or problems

## 2014-11-26 ENCOUNTER — Other Ambulatory Visit: Payer: Self-pay | Admitting: *Deleted

## 2014-11-26 MED ORDER — KETOCONAZOLE 2 % EX CREA: TOPICAL_CREAM | CUTANEOUS | Status: DC

## 2014-11-26 NOTE — Telephone Encounter (Signed)
That is fine-please refill times 3

## 2014-11-26 NOTE — Telephone Encounter (Signed)
Electronic refill request, pt has CPE on 11/20/14, it doesn't look like you have prescribed this Rx in over 2 years, please advise

## 2014-11-26 NOTE — Telephone Encounter (Signed)
done

## 2014-11-29 ENCOUNTER — Encounter: Payer: Self-pay | Admitting: Gastroenterology

## 2014-12-25 ENCOUNTER — Ambulatory Visit: Payer: 59 | Admitting: Family Medicine

## 2015-01-21 ENCOUNTER — Ambulatory Visit (INDEPENDENT_AMBULATORY_CARE_PROVIDER_SITE_OTHER): Payer: 59 | Admitting: Family Medicine

## 2015-01-21 ENCOUNTER — Encounter: Payer: Self-pay | Admitting: Family Medicine

## 2015-01-21 VITALS — BP 118/72 | HR 71 | Temp 98.3°F | Ht 65.0 in | Wt 221.0 lb

## 2015-01-21 DIAGNOSIS — J209 Acute bronchitis, unspecified: Secondary | ICD-10-CM

## 2015-01-21 MED ORDER — HYDROCODONE-HOMATROPINE 5-1.5 MG/5ML PO SYRP: 5.0000 mL | ORAL_SOLUTION | Freq: Three times a day (TID) | ORAL | Status: DC | PRN

## 2015-01-21 MED ORDER — BENZONATATE 200 MG PO CAPS: 200.0000 mg | ORAL_CAPSULE | Freq: Three times a day (TID) | ORAL | Status: DC | PRN

## 2015-01-21 MED ORDER — AZITHROMYCIN 250 MG PO TABS: ORAL_TABLET | ORAL | Status: DC

## 2015-01-21 NOTE — Progress Notes (Signed)
Subjective:    Patient ID: Evelyn Shepherd, female    DOB: 1960-10-31, 55 y.o.   MRN: [id number]  HPI Here for uri symptoms for a month Improved and then got worse again   Sat night -coughed all night  Sunday - felt lousy all day  Feels phlegm in her throat / felt swollen in throat (benadryl helped a bit) Tried mucinex - tabs and syrup - not too helpful   Cough- every now and then a little green phlegm  Wheezes occ-not severe   No fever   No facial pain   Most of soreness in chest and back   Ears -full on and off  Throat is sore on and off   Patient Active Problem List   Diagnosis Date Noted  . Acute bronchitis 01/21/2015  . Viral URI 12/26/2013  . Low back pain 12/26/2013  . Brachioradial pruritus 11/07/2013  . Fatigue 11/07/2013  . Submandibular gland swelling 11/07/2013  . Encounter for routine gynecological examination 04/11/2013  . Chest pain 04/11/2013  . UTI (urinary tract infection) 04/11/2013  . Plantar fasciitis 04/11/2013  . Colon cancer screening 02/13/2013  . Leg cramps 02/13/2013  . Other screening mammogram 04/13/2011  . Special screening for malignant neoplasms, colon 04/13/2011  . Seborrheic dermatitis 04/13/2011  . Routine general medical examination at a health care facility 03/25/2011  . AMENORRHEA 09/18/2008  . MENORRHAGIA 11/28/2007  . Hypothyroidism 08/11/2007  . LOW BACK PAIN, CHRONIC 07/14/2006  . Hyperglycemia 07/06/2006  . Hyperlipidemia 07/06/2006  . DEPRESSION 07/06/2006  . COMMON MIGRAINE 07/06/2006  . GERD 07/06/2006   Past Medical History  Diagnosis Date  . History of depression   . Diabetes mellitus ([place])     Type II, 3/07  . History of gastroesophageal reflux (GERD)   . History of hyperlipidemia   . History of migraine   . History of obesity     lap band surgery  . History of pneumonia 2005   Past Surgical History  Procedure Laterality Date  . Cesarean section    . Breast enhancement surgery    . Lumbar disc  surgery    . Dental surgery    . Tongue surgery  03/2005    lesion removal  . Laparoscopic gastric banding  07/31/07   Social History  Substance Use Topics  . Smoking status: Former Smoker    Quit date: 04/05/2005  . Smokeless tobacco: Never Used  . Alcohol Use: 0.0 oz/week    0 Standard drinks or equivalent per week     Comment: occasional   Family History  Problem Relation Age of Onset  . Kidney cancer Mother   . Diabetes Mother   . Pneumonia Father     died  . Lung cancer Father   . Depression Brother     commited suicide  . Alcohol abuse Brother   . Early menopause Other     family  . Colon cancer Neg Hx   . Pancreatic cancer Neg Hx   . Rectal cancer Neg Hx   . Stomach cancer Neg Hx    Allergies  Allergen Reactions  . Meloxicam     REACTION: unspecified  . Sulfonamide Derivatives     REACTION: unspecified   Current Outpatient Prescriptions on File Prior to Visit  Medication Sig Dispense Refill  . atorvastatin (LIPITOR) 10 MG tablet Take 1 tablet (10 mg total) by mouth daily. 90 tablet 3  . capsaicin (ZOSTRIX) 0.025 % cream Apply topically 2 (two) times  daily. Apply to affected areas twice daily 60 g 1  . escitalopram (LEXAPRO) 20 MG tablet TAKE 1 TABLET BY MOUTH ONCE DAILY 90 tablet 3  . ketoconazole (NIZORAL) 2 % cream APPLY TO AREAS OF FACE THAT ARE AFFECTED ONCE DAILY 60 g 2  . metaxalone (SKELAXIN) 800 MG tablet Take 1 tablet (800 mg total) by mouth 3 (three) times daily. As needed for back pain caution of sedation 30 tablet 0  . SUMAtriptan (IMITREX) 100 MG tablet TAKE 1 TABLET BY MOUTH DAILY AS NEEDED FOR MIGRAINE. 36 tablet 11   No current facility-administered medications on file prior to visit.     Review of Systems Review of Systems  Constitutional: Negative for fever, appetite change,  and unexpected weight change.  ENT pos for cong and neg for facial pain  Eyes: Negative for pain and visual disturbance.  Respiratory: Negative for shortness of  breath.   Cardiovascular: Negative for cp or palpitations    Gastrointestinal: Negative for nausea, diarrhea and constipation.  Genitourinary: Negative for urgency and frequency.  Skin: Negative for pallor or rash   Neurological: Negative for weakness, light-headedness, numbness and headaches.  Hematological: Negative for adenopathy. Does not bruise/bleed easily.  Psychiatric/Behavioral: Negative for dysphoric mood. The patient is not nervous/anxious.         Objective:   Physical Exam  Constitutional: She appears well-developed and well-nourished. No distress.  obese and well appearing   HENT:  Head: Normocephalic and atraumatic.  Right Ear: External ear normal.  Left Ear: External ear normal.  Mouth/Throat: Oropharynx is clear and moist.  Nares are injected and congested  No sinus tenderness Clear rhinorrhea and post nasal drip   Eyes: Conjunctivae and EOM are normal. Pupils are equal, round, and reactive to light. Right eye exhibits no discharge. Left eye exhibits no discharge.  Neck: Normal range of motion. Neck supple.  Cardiovascular: Normal rate and normal heart sounds.   Pulmonary/Chest: Effort normal and breath sounds normal. No respiratory distress. She has no wheezes. She has no rales. She exhibits no tenderness.  Harsh bs Few rhonchi No rales Barky cough  Lymphadenopathy:    She has no cervical adenopathy.  Neurological: She is alert.  Skin: Skin is warm and dry. No rash noted.  Psychiatric: She has a normal mood and affect.          Assessment & Plan:   Problem List Items Addressed This Visit      Respiratory   Acute bronchitis - Primary    I think you have bronchitis with a post viral cough syndrome  Drink lots of fluids and rest when you can  Take zpak as directed  Tessalon for cough - do not bite pill Hycodan - cough syrup with caution of sedation- bedtime or when not working or driving    Update if not starting to improve in a week or if  worsening

## 2015-01-21 NOTE — Progress Notes (Signed)
Pre visit review using our clinic review tool, if applicable. No additional management support is needed unless otherwise documented below in the visit note. 

## 2015-01-21 NOTE — Patient Instructions (Signed)
I think you have bronchitis with a post viral cough syndrome  Drink lots of fluids and rest when you can  Take zpak as directed  Tessalon for cough - do not bite pill Hycodan - cough syrup with caution of sedation- bedtime or when not working or driving    Update if not starting to improve in a week or if worsening

## 2015-01-22 NOTE — Assessment & Plan Note (Signed)
I think you have bronchitis with a post viral cough syndrome  Drink lots of fluids and rest when you can  Take zpak as directed  Tessalon for cough - do not bite pill Hycodan - cough syrup with caution of sedation- bedtime or when not working or driving    Update if not starting to improve in a week or if worsening

## 2015-01-24 ENCOUNTER — Ambulatory Visit (AMBULATORY_SURGERY_CENTER): Payer: Self-pay

## 2015-01-24 VITALS — Ht 66.0 in | Wt 225.6 lb

## 2015-01-24 DIAGNOSIS — Z1211 Encounter for screening for malignant neoplasm of colon: Secondary | ICD-10-CM

## 2015-01-24 MED ORDER — SUPREP BOWEL PREP KIT 17.5-3.13-1.6 GM/177ML PO SOLN: 1.0000 | Freq: Once | ORAL | Status: DC

## 2015-01-24 MED FILL — SUPREP BOWEL PREP KIT: 17.5-3.13-1 | 1 days supply | Qty: 354 | Fill #0

## 2015-01-24 NOTE — Progress Notes (Signed)
No allergies to eggs or soy No diet/weight loss meds No home oxygen No past problems with anesthesia  Has email and internet; registered for emmi

## 2015-01-29 ENCOUNTER — Ambulatory Visit (INDEPENDENT_AMBULATORY_CARE_PROVIDER_SITE_OTHER): Payer: 59 | Admitting: Family Medicine

## 2015-01-29 ENCOUNTER — Ambulatory Visit (INDEPENDENT_AMBULATORY_CARE_PROVIDER_SITE_OTHER)
Admission: RE | Admit: 2015-01-29 | Discharge: 2015-01-29 | Disposition: A | Discharge status: Home / Self Care | Payer: 59 | Source: Ambulatory Visit | Attending: Family Medicine | Admitting: Family Medicine

## 2015-01-29 ENCOUNTER — Encounter: Payer: Self-pay | Admitting: Family Medicine

## 2015-01-29 VITALS — BP 124/76 | HR 70 | Temp 98.1°F | Wt 220.0 lb

## 2015-01-29 DIAGNOSIS — M79671 Pain in right foot: Secondary | ICD-10-CM

## 2015-01-29 DIAGNOSIS — M7072 Other bursitis of hip, left hip: Secondary | ICD-10-CM

## 2015-01-29 DIAGNOSIS — M19071 Primary osteoarthritis, right ankle and foot: Secondary | ICD-10-CM | POA: Diagnosis not present

## 2015-01-29 DIAGNOSIS — M79641 Pain in right hand: Secondary | ICD-10-CM | POA: Diagnosis not present

## 2015-01-29 DIAGNOSIS — M1811 Unilateral primary osteoarthritis of first carpometacarpal joint, right hand: Secondary | ICD-10-CM | POA: Diagnosis not present

## 2015-01-29 NOTE — Progress Notes (Signed)
Pre visit review using our clinic review tool, if applicable. No additional management support is needed unless otherwise documented below in the visit note. 

## 2015-01-29 NOTE — Progress Notes (Signed)
Subjective:    Patient ID: Evelyn Shepherd, female    DOB: [birth year], 55 y.o.   MRN: [id number]  HPI   Here for multiple problems  Is doing welll  Breathing and bronchitis is much better - cough is almost gone   Is back on lipitor - no problems from that   Headaches are not as bad  Gets them occasionally -worse if she sleeps in or gets out of routine  imitrex - knows when to take  Allergies correlate also   R foot -still really hurts-esp in the heel  Is painful once she stands after inactivity  Stabbing / sharp pain  Does throb at times  Rad into her ankle and side of the foot  Worst when she first gets up - has to hobble for a bit before she can walk  Looked on line and started some stretches that helps some  Has not tried ice  Not taking anything for it  Used bio freeze-no help  At home barefoot   Also L lateral leg feels numb  Hurts over area of lateral hip -that is tender to the touch   Also hurts in her back between shoulder blades  Bought a back support for her chair   Also R thumb pain - base of thumb  R handed -uses it for the mouse  Constantly hurts   Is frustrated with all these aches and pains   Is also cold all the time - not hot flashes  Lab Results  Component Value Date   WBC 4.8 11/15/2014   HGB 12.6 11/15/2014   HCT 37.6 11/15/2014   MCV 86.2 11/15/2014   PLT 227.0 11/15/2014   Lab Results  Component Value Date   TSH 3.04 11/15/2014     Patient Active Problem List   Diagnosis Date Noted  . Right foot pain 01/29/2015  . Bursitis of left hip 01/29/2015  . Right hand pain 01/29/2015  . Acute bronchitis 01/21/2015  . Viral URI 12/26/2013  . Low back pain 12/26/2013  . Brachioradial pruritus 11/07/2013  . Fatigue 11/07/2013  . Submandibular gland swelling 11/07/2013  . Encounter for routine gynecological examination 04/11/2013  . Chest pain 04/11/2013  . UTI (urinary tract infection) 04/11/2013  . Plantar fasciitis 04/11/2013  .  Colon cancer screening 02/13/2013  . Leg cramps 02/13/2013  . Other screening mammogram 04/13/2011  . Special screening for malignant neoplasms, colon 04/13/2011  . Seborrheic dermatitis 04/13/2011  . Routine general medical examination at a health care facility 03/25/2011  . AMENORRHEA 09/18/2008  . MENORRHAGIA 11/28/2007  . Hypothyroidism 08/11/2007  . LOW BACK PAIN, CHRONIC 07/14/2006  . Hyperglycemia 07/06/2006  . Hyperlipidemia 07/06/2006  . DEPRESSION 07/06/2006  . COMMON MIGRAINE 07/06/2006  . GERD 07/06/2006   Past Medical History  Diagnosis Date  . History of depression   . Diabetes mellitus ([place])     Type II, 3/07  . History of gastroesophageal reflux (GERD)   . History of hyperlipidemia   . History of migraine   . History of obesity     lap band surgery  . History of pneumonia 2005   Past Surgical History  Procedure Laterality Date  . Cesarean section    . Breast enhancement surgery    . Lumbar disc surgery    . Dental surgery    . Tongue surgery  03/2005    lesion removal  . Laparoscopic gastric banding  07/31/07   Social History  Substance Use Topics  .  Smoking status: Former Smoker    Quit date: 04/05/2005  . Smokeless tobacco: Never Used  . Alcohol Use: 0.0 oz/week    0 Standard drinks or equivalent per week     Comment: occasional on monthly basis   Family History  Problem Relation Age of Onset  . Kidney cancer Mother   . Diabetes Mother   . Pneumonia Father     died  . Lung cancer Father   . Depression Brother     commited suicide  . Alcohol abuse Brother   . Early menopause Other     family  . Colon cancer Neg Hx   . Pancreatic cancer Neg Hx   . Rectal cancer Neg Hx   . Stomach cancer Neg Hx    Allergies  Allergen Reactions  . Meloxicam     REACTION: unspecified  . Sulfonamide Derivatives     REACTION: unspecified   Current Outpatient Prescriptions on File Prior to Visit  Medication Sig Dispense Refill  . atorvastatin  (LIPITOR) 10 MG tablet Take 1 tablet (10 mg total) by mouth daily. 90 tablet 3  . escitalopram (LEXAPRO) 20 MG tablet TAKE 1 TABLET BY MOUTH ONCE DAILY 90 tablet 3  . ketoconazole (NIZORAL) 2 % cream APPLY TO AREAS OF FACE THAT ARE AFFECTED ONCE DAILY 60 g 2  . metaxalone (SKELAXIN) 800 MG tablet Take 1 tablet (800 mg total) by mouth 3 (three) times daily. As needed for back pain caution of sedation 30 tablet 0  . SUMAtriptan (IMITREX) 100 MG tablet TAKE 1 TABLET BY MOUTH DAILY AS NEEDED FOR MIGRAINE. 36 tablet 11  . SUPREP BOWEL PREP SOLN Take 1 kit by mouth once. (Patient not taking: Reported on 01/29/2015) 354 mL 0   No current facility-administered medications on file prior to visit.     Review of Systems    Review of Systems  Constitutional: Negative for fever, appetite change, fatigue and unexpected weight change.  Eyes: Negative for pain and visual disturbance.  Respiratory: Negative for cough and shortness of breath.   Cardiovascular: Negative for cp or palpitations    Gastrointestinal: Negative for nausea, diarrhea and constipation.  Genitourinary: Negative for urgency and frequency.  Skin: Negative for pallor or rash   MSK pos for joint pain w/o swelling  Neurological: Negative for weakness, light-headedness, numbness and headaches.  Hematological: Negative for adenopathy. Does not bruise/bleed easily.  Psychiatric/Behavioral: Negative for dysphoric mood. The patient is not nervous/anxious.      Objective:   Physical Exam  Constitutional: She appears well-developed and well-nourished. No distress.  obese and well appearing   HENT:  Head: Normocephalic and atraumatic.  Eyes: Conjunctivae and EOM are normal. Pupils are equal, round, and reactive to light. No scleral icterus.  Neck: Normal range of motion. Neck supple.  Cardiovascular: Normal rate and regular rhythm.   Pulmonary/Chest: Effort normal and breath sounds normal.  Musculoskeletal: She exhibits tenderness. She  exhibits no edema.  Tender L greater trochanter Some pain on L hip ext rotation -otherwise nl rom  Nl LS rom   R thumb- tender at base Pain to adduct thumb No crepitus or swelling   R foot-tender over heel/esp laterally  Small medial bunion  Nl rom ankle and foot -some pain on full dorsi flex of foot   Lymphadenopathy:    She has no cervical adenopathy.  Neurological: She is alert. She has normal reflexes. No cranial nerve deficit. She exhibits normal muscle tone. Coordination normal.  Skin: Skin is  warm and dry. No rash noted. No erythema.  Psychiatric: She has a normal mood and affect.          Assessment & Plan:   Problem List Items Addressed This Visit      Musculoskeletal and Integument   Bursitis of left hip    Disc use of ice and aleve prn Adv not to lie on that side Enc stretching Consider sport med consult if no improvement         Other   Right foot pain - Primary    Strongly suspect plantar fasciitis but will xr to r/o stress fx as well Disc stretching/ ice and hard soled shoes at all times  Wt loss would also help Aleve as tolerated Consider sport med visit if no imp      Relevant Orders   DG Foot Complete Right (Completed)   Right hand pain    At base of thumb  R handed and uses a mouse Recommend use of ice/relative rest and also aleve prn  xr today - r/o arthritis       Relevant Orders   DG Hand Complete Right (Completed)

## 2015-01-29 NOTE — Patient Instructions (Signed)
I think you likely have plantar fasciitis  Wear shoes (with a firm sole) at all times Try rolling foot over a frozen water bottle to massage it twice daily for 10 minutes Try aleve 1-2 pills with a meal up to twice daily  Keep stretching your foot  Xray of foot today   I think you may have arthritis of proximal thumb or tendonitis  You can try a wrist splint over the counter to see if it gives you relief  Use ice for 10 minutes at time whenever you get a chance Hand xray today  Aleve - see above  I think you may have hip bursitis  Ice every chance you get  Do not lie on that hip  Try the aleve for this as well  It may need an injection if no improvement - Dr Lorelei Pont is our sport med doctor if you need to call and make an appt with him (give it 1-2 weeks to see if it improves)

## 2015-01-30 NOTE — Assessment & Plan Note (Signed)
At base of thumb  R handed and uses a mouse Recommend use of ice/relative rest and also aleve prn  xr today - r/o arthritis

## 2015-01-30 NOTE — Assessment & Plan Note (Signed)
Disc use of ice and aleve prn Adv not to lie on that side Enc stretching Consider sport med consult if no improvement

## 2015-01-30 NOTE — Assessment & Plan Note (Signed)
Strongly suspect plantar fasciitis but will xr to r/o stress fx as well Disc stretching/ ice and hard soled shoes at all times  Wt loss would also help Aleve as tolerated Consider sport med visit if no imp

## 2015-01-31 ENCOUNTER — Ambulatory Visit (AMBULATORY_SURGERY_CENTER): Payer: 59 | Admitting: Gastroenterology

## 2015-01-31 ENCOUNTER — Encounter: Payer: Self-pay | Admitting: Gastroenterology

## 2015-01-31 VITALS — BP 106/65 | HR 64 | Temp 97.9°F | Resp 28 | Ht 66.0 in | Wt 225.0 lb

## 2015-01-31 DIAGNOSIS — Z1211 Encounter for screening for malignant neoplasm of colon: Secondary | ICD-10-CM

## 2015-01-31 DIAGNOSIS — D123 Benign neoplasm of transverse colon: Secondary | ICD-10-CM | POA: Diagnosis not present

## 2015-01-31 DIAGNOSIS — K635 Polyp of colon: Secondary | ICD-10-CM | POA: Diagnosis not present

## 2015-01-31 DIAGNOSIS — F329 Major depressive disorder, single episode, unspecified: Secondary | ICD-10-CM | POA: Diagnosis not present

## 2015-01-31 MED ORDER — SODIUM CHLORIDE 0.9 % IV SOLN: 500.0000 mL | INTRAVENOUS | Status: DC

## 2015-01-31 NOTE — Progress Notes (Signed)
Called to room to assist during endoscopic procedure.  Patient ID and intended procedure confirmed with present staff. Received instructions for my participation in the procedure from the performing physician.  

## 2015-01-31 NOTE — Patient Instructions (Signed)
YOU HAD AN ENDOSCOPIC PROCEDURE TODAY AT [facility]:   Refer to the procedure report that was given to you for any specific questions about what was found during the examination.  If the procedure report does not answer your questions, please call your gastroenterologist to clarify.  If you requested that your care partner not be given the details of your procedure findings, then the procedure report has been included in a sealed envelope for you to review at your convenience later.  YOU SHOULD EXPECT: Some feelings of bloating in the abdomen. Passage of more gas than usual.  Walking can help get rid of the air that was put into your GI tract during the procedure and reduce the bloating. If you had a lower endoscopy (such as a colonoscopy or flexible sigmoidoscopy) you may notice spotting of blood in your stool or on the toilet paper. If you underwent a bowel prep for your procedure, you may not have a normal bowel movement for a few days.  Please Note:  You might notice some irritation and congestion in your nose or some drainage.  This is from the oxygen used during your procedure.  There is no need for concern and it should clear up in a day or so.  SYMPTOMS TO REPORT IMMEDIATELY:   Following lower endoscopy (colonoscopy or flexible sigmoidoscopy):  Excessive amounts of blood in the stool  Significant tenderness or worsening of abdominal pains  Swelling of the abdomen that is new, acute  Fever of 100F or higher   For urgent or emergent issues, a gastroenterologist can be reached at any hour by calling [phone].   DIET: Your first meal following the procedure should be a small meal and then it is ok to progress to your normal diet. Heavy or fried foods are harder to digest and may make you feel nauseous or bloated.  Likewise, meals heavy in dairy and vegetables can increase bloating.  Drink plenty of fluids but you should avoid alcoholic beverages for 24  hours.  ACTIVITY:  You should plan to take it easy for the rest of today and you should NOT DRIVE or use heavy machinery until tomorrow (because of the sedation medicines used during the test).    FOLLOW UP: Our staff will call the number listed on your records the next business day following your procedure to check on you and address any questions or concerns that you may have regarding the information given to you following your procedure. If we do not reach you, we will leave a message.  However, if you are feeling well and you are not experiencing any problems, there is no need to return our call.  We will assume that you have returned to your regular daily activities without incident.  If any biopsies were taken you will be contacted by phone or by letter within the next 1-3 weeks.  Please call us at [phone] if you have not heard about the biopsies in 3 weeks.    SIGNATURES/CONFIDENTIALITY: You and/or your care partner have signed paperwork which will be entered into your electronic medical record.  These signatures attest to the fact that that the information above on your After Visit Summary has been reviewed and is understood.  Full responsibility of the confidentiality of this discharge information lies with you and/or your care-partner.  Please, read all of the handouts given to you by your recovery room nurse.  Thank-you for choosing [place] for your healthcare needs.

## 2015-01-31 NOTE — Progress Notes (Signed)
Report to PACU, RN, vss, BBS= Clear.  

## 2015-01-31 NOTE — Op Note (Signed)
[facility]  [address] [place], [id number]   COLONOSCOPY PROCEDURE REPORT  PATIENT: Evelyn Shepherd, Evelyn Shepherd  MR#: [id number] BIRTHDATE: February 23, 1960 , 4  yrs. old GENDER: female ENDOSCOPIST: Ladene Artist, MD, [facility] REFERRED [id number] Vernell Morgans, M.D. PROCEDURE DATE:  01/31/2015 PROCEDURE:   Colonoscopy, screening and Colonoscopy with snare polypectomy First Screening Colonoscopy - Avg.  risk and is 50 yrs.  old or older Yes.  Prior Negative Screening - Now for repeat screening. N/A  History of Adenoma - Now for follow-up colonoscopy & has been > or = to 3 yrs.  N/A  Polyps removed today? Yes ASA CLASS:   Class II INDICATIONS:Screening for colonic neoplasia and Colorectal Neoplasm Risk Assessment for this procedure is average risk. MEDICATIONS: Monitored anesthesia care and Propofol 250 mg IV DESCRIPTION OF PROCEDURE:   After the risks benefits and alternatives of the procedure were thoroughly explained, informed consent was obtained.  The digital rectal exam revealed no abnormalities of the rectum.   The LB [id number]-H190 [id number]  endoscope was introduced through the anus and advanced to the cecum, which was identified by both the appendix and ileocecal valve. No adverse events experienced.   The quality of the prep was good.  (Suprep was used)  The instrument was then slowly withdrawn as the colon was fully examined. Estimated blood loss is zero unless otherwise noted in this procedure report.    COLON FINDINGS: A sessile polyp measuring 7 mm in size was found in the transverse colon.  Polypectomies were performed.  A polypectomy was performed with a cold snare.  The resection was complete, the polyp tissue was completely retrieved and sent to histology. Melanosis coli was found throughout the entire examined colon. The examination was otherwise normal.  Retroflexed views revealed no abnormalities. The time to cecum = 3.3 Withdrawal time = 10.9 The scope was withdrawn and  the procedure completed. COMPLICATIONS: There were no immediate complications.  ENDOSCOPIC IMPRESSION: 1.   Sessile polyp in the transverse colon; polypectomy performed with a cold snare 2.   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  Await pathology results 2.  Repeat colonoscopy in 5 years if polyp adenomatous; otherwise 10 years  eSigned:  Ladene Artist, MD, [facility] 01/31/2015 8:25 AM

## 2015-02-03 ENCOUNTER — Telehealth: Payer: Self-pay | Admitting: *Deleted

## 2015-02-03 NOTE — Telephone Encounter (Signed)
Message left

## 2015-02-11 ENCOUNTER — Encounter: Payer: Self-pay | Admitting: Gastroenterology

## 2015-02-25 ENCOUNTER — Other Ambulatory Visit: Payer: Self-pay | Admitting: Family Medicine

## 2015-02-25 MED FILL — ESCITALOPRAM 20 MG TABLET: 20 | 90 days supply | Qty: 90 | Fill #3

## 2015-02-25 MED FILL — ATORVASTATIN 10 MG TABLET: 10 | 90 days supply | Qty: 90 | Fill #1

## 2015-02-26 NOTE — Telephone Encounter (Signed)
Please refill times 3 

## 2015-02-26 NOTE — Telephone Encounter (Signed)
Last refilled on 02/11/14 #36 with 11 additional refills, please advise

## 2015-02-27 MED FILL — SUMATRIPTAN SUCC 100 MG TAB: 100 | 90 days supply | Qty: 27 | Fill #0

## 2015-02-27 NOTE — Telephone Encounter (Signed)
done

## 2015-03-27 ENCOUNTER — Emergency Department (HOSPITAL_COMMUNITY)
Admission: EM | Admit: 2015-03-27 | Discharge: 2015-03-27 | Disposition: A | Discharge status: Home / Self Care | Payer: 59 | Source: Home / Self Care

## 2015-03-27 ENCOUNTER — Ambulatory Visit: Payer: 59 | Admitting: Family Medicine

## 2015-03-27 ENCOUNTER — Encounter (HOSPITAL_COMMUNITY): Payer: Self-pay | Admitting: *Deleted

## 2015-03-27 DIAGNOSIS — R69 Illness, unspecified: Principal | ICD-10-CM

## 2015-03-27 DIAGNOSIS — J111 Influenza due to unidentified influenza virus with other respiratory manifestations: Secondary | ICD-10-CM

## 2015-03-27 MED ORDER — OSELTAMIVIR PHOSPHATE 75 MG PO CAPS: 75.0000 mg | ORAL_CAPSULE | Freq: Two times a day (BID) | ORAL | Status: DC

## 2015-03-27 MED FILL — OSELTAMIVIR PHOS 75 MG CAP: 75 | 5 days supply | Qty: 10 | Fill #0

## 2015-03-27 NOTE — ED Notes (Signed)
Pt  Reports  Symptoms  Of  Body  Aches   Chills     Fever    Started  This  Am         Had  Slight  Cough  Yesterday

## 2015-03-27 NOTE — Discharge Instructions (Signed)
Drink plenty of fluids as discussed, use medicine as prescribed, and mucinex or delsym for cough. Return or see your doctor if further problems °

## 2015-03-27 NOTE — ED Provider Notes (Signed)
CSN: [id number]     Arrival date & time 03/27/15  1257 History   None    Chief Complaint  Patient presents with  . Fever   (Consider location/radiation/quality/duration/timing/severity/associated sxs/prior Treatment) Patient is a 55 y.o. female presenting with fever. The history is provided by the patient.  Fever Temp source:  Subjective Severity:  Moderate Onset quality:  Sudden Duration:  6 hours Progression:  Worsening Chronicity:  New Relieved by:  None tried Worsened by:  Nothing tried Ineffective treatments:  None tried Associated symptoms: chills, congestion and myalgias   Associated symptoms: no cough, no diarrhea, no dysuria, no nausea, no rash and no vomiting     Past Medical History  Diagnosis Date  . History of depression   . Diabetes mellitus ([place])     Type II, 3/07  . History of gastroesophageal reflux (GERD)   . History of hyperlipidemia   . History of migraine   . History of obesity     lap band surgery  . History of pneumonia 2005   Past Surgical History  Procedure Laterality Date  . Cesarean section    . Breast enhancement surgery    . Lumbar disc surgery    . Dental surgery    . Tongue surgery  03/2005    lesion removal  . Laparoscopic gastric banding  07/31/07   Family History  Problem Relation Age of Onset  . Kidney cancer Mother   . Diabetes Mother   . Pneumonia Father     died  . Lung cancer Father   . Depression Brother     commited suicide  . Alcohol abuse Brother   . Early menopause Other     family  . Colon cancer Neg Hx   . Pancreatic cancer Neg Hx   . Rectal cancer Neg Hx   . Stomach cancer Neg Hx    Social History  Substance Use Topics  . Smoking status: Former Smoker    Quit date: 04/05/2005  . Smokeless tobacco: Never Used  . Alcohol Use: 0.0 oz/week    0 Standard drinks or equivalent per week     Comment: occasional on monthly basis   OB History    No data available     Review of Systems  Constitutional:  Positive for fever and chills.  HENT: Positive for congestion and postnasal drip.   Respiratory: Negative for cough.   Cardiovascular: Negative.   Gastrointestinal: Negative.  Negative for nausea, vomiting and diarrhea.  Genitourinary: Negative.  Negative for dysuria.  Musculoskeletal: Positive for myalgias.  Skin: Negative for rash.  All other systems reviewed and are negative.   Allergies  Meloxicam and Sulfonamide derivatives  Home Medications   Prior to Admission medications   Medication Sig Start Date End Date Taking? Authorizing Provider  atorvastatin (LIPITOR) 10 MG tablet Take 1 tablet (10 mg total) by mouth daily. 11/20/14   Abner Greenspan, MD  escitalopram (LEXAPRO) 20 MG tablet TAKE 1 TABLET BY MOUTH ONCE DAILY 05/02/14   Wynelle Fanny Tower, MD  ketoconazole (NIZORAL) 2 % cream APPLY TO AREAS OF FACE THAT ARE AFFECTED ONCE DAILY 11/26/14   Abner Greenspan, MD  metaxalone (SKELAXIN) 800 MG tablet Take 1 tablet (800 mg total) by mouth 3 (three) times daily. As needed for back pain caution of sedation 02/21/13   Abner Greenspan, MD  SUMAtriptan (IMITREX) 100 MG tablet TAKE 1 TABLET BY MOUTH DAILY AS NEEDED FOR MIGRAINE. 02/27/15   Abner Greenspan, MD  Meds Ordered and Administered this Visit  Medications - No data to display  BP 113/77 mmHg  Pulse 111  Temp(Src) 100.4 F (38 C) (Oral)  Resp 14  SpO2 95% No data found.   Physical Exam  Constitutional: She is oriented to person, place, and time. She appears well-developed and well-nourished. No distress.  HENT:  Head: Normocephalic.  Right Ear: External ear normal.  Left Ear: External ear normal.  Mouth/Throat: Oropharynx is clear and moist.  Eyes: Pupils are equal, round, and reactive to light.  Neck: Normal range of motion. Neck supple.  Cardiovascular: Regular rhythm, normal heart sounds and intact distal pulses.   Pulmonary/Chest: Effort normal and breath sounds normal.  Lymphadenopathy:    She has no cervical adenopathy.   Neurological: She is alert and oriented to person, place, and time.  Skin: Skin is warm and dry.  Nursing note and vitals reviewed.   ED Course  Procedures (including critical care time)  Labs Review Labs Reviewed - No data to display  Imaging Review No results found.   Visual Acuity Review  Right Eye Distance:   Left Eye Distance:   Bilateral Distance:    Right Eye Near:   Left Eye Near:    Bilateral Near:         MDM  No diagnosis found. Meds ordered this encounter  Medications  . oseltamivir (TAMIFLU) 75 MG capsule    Sig: Take 1 capsule (75 mg total) by mouth every 12 (twelve) hours. Take all of medication.    Dispense:  10 capsule    Refill:  0       Billy Fischer, MD 03/27/15 1324

## 2015-05-13 ENCOUNTER — Other Ambulatory Visit: Payer: Self-pay | Admitting: Family Medicine

## 2015-05-19 DIAGNOSIS — M76822 Posterior tibial tendinitis, left leg: Secondary | ICD-10-CM | POA: Diagnosis not present

## 2015-05-19 DIAGNOSIS — M71571 Other bursitis, not elsewhere classified, right ankle and foot: Secondary | ICD-10-CM | POA: Diagnosis not present

## 2015-05-19 DIAGNOSIS — M76821 Posterior tibial tendinitis, right leg: Secondary | ICD-10-CM | POA: Diagnosis not present

## 2015-05-19 DIAGNOSIS — M7731 Calcaneal spur, right foot: Secondary | ICD-10-CM | POA: Diagnosis not present

## 2015-05-19 DIAGNOSIS — M71572 Other bursitis, not elsewhere classified, left ankle and foot: Secondary | ICD-10-CM | POA: Diagnosis not present

## 2015-05-19 DIAGNOSIS — M2010 Hallux valgus (acquired), unspecified foot: Secondary | ICD-10-CM | POA: Diagnosis not present

## 2015-05-19 DIAGNOSIS — M7732 Calcaneal spur, left foot: Secondary | ICD-10-CM | POA: Diagnosis not present

## 2015-05-19 DIAGNOSIS — M722 Plantar fascial fibromatosis: Secondary | ICD-10-CM | POA: Diagnosis not present

## 2015-05-26 DIAGNOSIS — M71571 Other bursitis, not elsewhere classified, right ankle and foot: Secondary | ICD-10-CM | POA: Diagnosis not present

## 2015-05-26 DIAGNOSIS — M722 Plantar fascial fibromatosis: Secondary | ICD-10-CM | POA: Diagnosis not present

## 2015-05-26 DIAGNOSIS — M71572 Other bursitis, not elsewhere classified, left ankle and foot: Secondary | ICD-10-CM | POA: Diagnosis not present

## 2015-06-11 MED FILL — ESCITALOPRAM 20 MG TABLET: 20 | 90 days supply | Qty: 90 | Fill #0

## 2015-06-11 MED FILL — SUMATRIPTAN SUCC 100 MG TAB: 100 | 90 days supply | Qty: 27 | Fill #1

## 2015-06-11 MED FILL — ATORVASTATIN 10 MG TABLET: 10 | 90 days supply | Qty: 90 | Fill #2

## 2015-06-11 MED FILL — KETOCONAZOLE 2% CREAM: 2 | 10 days supply | Qty: 60 | Fill #1

## 2015-09-22 ENCOUNTER — Other Ambulatory Visit: Payer: Self-pay | Admitting: Family Medicine

## 2015-09-22 MED FILL — SUMATRIPTAN SUCC 100 MG TAB: 100 | 90 days supply | Qty: 27 | Fill #2

## 2015-09-22 MED FILL — METAXALONE 800 MG TABLET: 800 | 10 days supply | Qty: 30 | Fill #0

## 2015-09-22 MED FILL — ESCITALOPRAM 20 MG TABLET: 20 | 90 days supply | Qty: 90 | Fill #1

## 2015-09-22 NOTE — Telephone Encounter (Signed)
Last OV was 01/29/15, last time I can tell med was filled was 02/21/13 #30 with 0 refills, please advise

## 2015-09-22 NOTE — Telephone Encounter (Signed)
Please refill times one  

## 2015-11-21 MED FILL — KETOCONAZOLE 2% CREAM: 2 | 10 days supply | Qty: 60 | Fill #2

## 2015-12-23 MED FILL — SUMATRIPTAN SUCC 100 MG TAB: 100 | 90 days supply | Qty: 27 | Fill #3

## 2016-01-19 DIAGNOSIS — M5416 Radiculopathy, lumbar region: Secondary | ICD-10-CM | POA: Diagnosis not present

## 2016-01-19 DIAGNOSIS — M9904 Segmental and somatic dysfunction of sacral region: Secondary | ICD-10-CM | POA: Diagnosis not present

## 2016-01-19 DIAGNOSIS — M545 Low back pain: Secondary | ICD-10-CM | POA: Diagnosis not present

## 2016-01-19 DIAGNOSIS — M9903 Segmental and somatic dysfunction of lumbar region: Secondary | ICD-10-CM | POA: Diagnosis not present

## 2016-01-21 ENCOUNTER — Encounter: Payer: Self-pay | Admitting: Family Medicine

## 2016-01-21 ENCOUNTER — Ambulatory Visit (INDEPENDENT_AMBULATORY_CARE_PROVIDER_SITE_OTHER): Payer: 59 | Admitting: Family Medicine

## 2016-01-21 DIAGNOSIS — B029 Zoster without complications: Secondary | ICD-10-CM

## 2016-01-21 DIAGNOSIS — M9904 Segmental and somatic dysfunction of sacral region: Secondary | ICD-10-CM | POA: Diagnosis not present

## 2016-01-21 DIAGNOSIS — M545 Low back pain: Secondary | ICD-10-CM | POA: Diagnosis not present

## 2016-01-21 DIAGNOSIS — M5416 Radiculopathy, lumbar region: Secondary | ICD-10-CM | POA: Diagnosis not present

## 2016-01-21 DIAGNOSIS — M9903 Segmental and somatic dysfunction of lumbar region: Secondary | ICD-10-CM | POA: Diagnosis not present

## 2016-01-21 MED ORDER — GABAPENTIN 100 MG PO CAPS: 100.0000 mg | ORAL_CAPSULE | Freq: Three times a day (TID) | ORAL | 0 refills | Status: DC

## 2016-01-21 MED ORDER — LORATADINE 10 MG PO TABS: 10.0000 mg | ORAL_TABLET | Freq: Every day | ORAL | 0 refills | Status: DC

## 2016-01-21 MED ORDER — VALACYCLOVIR HCL 1 G PO TABS: 1000.0000 mg | ORAL_TABLET | Freq: Three times a day (TID) | ORAL | 0 refills | Status: DC

## 2016-01-21 NOTE — Patient Instructions (Signed)
Take care.  Take claritin for itching.  Taper up the dose of gabapentin for pain.  Start valtrex.   Update [place] as needed.

## 2016-01-21 NOTE — Progress Notes (Signed)
Pre visit review using our clinic review tool, if applicable. No additional management support is needed unless otherwise documented below in the visit note. 

## 2016-01-21 NOTE — Progress Notes (Signed)
Itching and pain.  Unilateral on the R side of back, under the arm and under the breast.  Started about 2 weeks ago. Not better in the meantime.  Intermittent itching and pain.  Sharp pain.  No FCNAVD.  No L sided rash.  No sx like this prev.    Meds, vitals, and allergies reviewed.   ROS: Per HPI unless specifically indicated in ROS section   Chaperoned exam.   nad ncat rrr ctab Dermatomal  Rash noted on the right upper back, extends around the torso to the midline, doesn't cross midline.  Dermatomal paresthesia noted.

## 2016-01-22 DIAGNOSIS — Z8619 Personal history of other infectious and parasitic diseases: Secondary | ICD-10-CM | POA: Insufficient documentation

## 2016-01-22 NOTE — Assessment & Plan Note (Signed)
Would still start valtrex, in case it could have any benefit.  2 week timeline noted.  D/w pt.  Start gabapentin for pain, with routine cautions.  claritin for itching.  F/u prn.  Routine cautions d/w pt.  She agrees.

## 2016-02-12 DIAGNOSIS — H524 Presbyopia: Secondary | ICD-10-CM | POA: Diagnosis not present

## 2016-02-16 ENCOUNTER — Other Ambulatory Visit: Payer: Self-pay | Admitting: Family Medicine

## 2016-02-16 NOTE — Telephone Encounter (Signed)
Please schedule PE for summer and refill until then  Thanks

## 2016-02-16 NOTE — Telephone Encounter (Signed)
Pt had a recent appt with another provider but pt hasn't seen you in over a year and no future appts., please advise

## 2016-02-18 NOTE — Telephone Encounter (Signed)
CPE scheduled with Anda Kraft (okay per Anda Kraft) due to pt wanted CPE asap, pt didn't need med, per pt Rx declined

## 2016-03-01 ENCOUNTER — Encounter (HOSPITAL_COMMUNITY): Payer: Self-pay

## 2016-03-12 ENCOUNTER — Other Ambulatory Visit: Payer: Self-pay | Admitting: Family Medicine

## 2016-03-12 MED FILL — ESCITALOPRAM 20 MG TABLET: 20 | 90 days supply | Qty: 90 | Fill #0

## 2016-03-12 MED FILL — SUMATRIPTAN SUCC 100 MG TAB: 100 | 60 days supply | Qty: 36 | Fill #0

## 2016-03-12 NOTE — Telephone Encounter (Signed)
Will refill electronically  

## 2016-03-12 NOTE — Telephone Encounter (Signed)
Pt meds are due for a refill, pt has an appt with Anda Kraft for her CPE since you were booked on 03/26/16

## 2016-03-26 ENCOUNTER — Encounter: Payer: Self-pay | Admitting: Primary Care

## 2016-03-26 ENCOUNTER — Ambulatory Visit (INDEPENDENT_AMBULATORY_CARE_PROVIDER_SITE_OTHER): Payer: 59 | Admitting: Primary Care

## 2016-03-26 VITALS — BP 122/70 | HR 72 | Temp 98.2°F | Ht 65.0 in | Wt 225.0 lb

## 2016-03-26 DIAGNOSIS — R739 Hyperglycemia, unspecified: Secondary | ICD-10-CM | POA: Diagnosis not present

## 2016-03-26 DIAGNOSIS — Z23 Encounter for immunization: Secondary | ICD-10-CM | POA: Diagnosis not present

## 2016-03-26 DIAGNOSIS — Z1239 Encounter for other screening for malignant neoplasm of breast: Secondary | ICD-10-CM

## 2016-03-26 DIAGNOSIS — E039 Hypothyroidism, unspecified: Secondary | ICD-10-CM | POA: Diagnosis not present

## 2016-03-26 DIAGNOSIS — R7303 Prediabetes: Secondary | ICD-10-CM

## 2016-03-26 DIAGNOSIS — E785 Hyperlipidemia, unspecified: Secondary | ICD-10-CM | POA: Diagnosis not present

## 2016-03-26 DIAGNOSIS — Z0000 Encounter for general adult medical examination without abnormal findings: Secondary | ICD-10-CM

## 2016-03-26 DIAGNOSIS — Z1231 Encounter for screening mammogram for malignant neoplasm of breast: Secondary | ICD-10-CM

## 2016-03-26 DIAGNOSIS — R5383 Other fatigue: Secondary | ICD-10-CM

## 2016-03-26 LAB — LIPID PANEL
CHOLESTEROL: 252 mg/dL — AB (ref 0–200)
HDL: 56.7 mg/dL (ref >39.00)
LDL Cholesterol: 172 mg/dL — ABNORMAL HIGH (ref 0–99)
NonHDL: 195.72
Total CHOL/HDL Ratio: 4
Triglycerides: 119 mg/dL (ref 0.0–149.0)
VLDL: 23.8 mg/dL (ref 0.0–40.0)

## 2016-03-26 LAB — VITAMIN B12: Vitamin B-12: 222 pg/mL (ref 211–911)

## 2016-03-26 LAB — HEMOGLOBIN A1C: Hgb A1c MFr Bld: 6.1 % (ref 4.6–6.5)

## 2016-03-26 LAB — COMPREHENSIVE METABOLIC PANEL
ALBUMIN: 4.1 g/dL (ref 3.5–5.2)
ALK PHOS: 69 U/L (ref 39–117)
ALT: 19 U/L (ref 0–35)
AST: 15 U/L (ref 0–37)
BILIRUBIN TOTAL: 0.4 mg/dL (ref 0.2–1.2)
BUN: 17 mg/dL (ref 6–23)
CO2: 27 mEq/L (ref 19–32)
Calcium: 9.3 mg/dL (ref 8.4–10.5)
Chloride: 103 mEq/L (ref 96–112)
Creatinine, Ser: 0.88 mg/dL (ref 0.40–1.20)
GFR: 70.77 mL/min (ref >60.00)
Glucose, Bld: 108 mg/dL — ABNORMAL HIGH (ref 70–99)
POTASSIUM: 3.8 meq/L (ref 3.5–5.1)
Sodium: 137 mEq/L (ref 135–145)
TOTAL PROTEIN: 6.8 g/dL (ref 6.0–8.3)

## 2016-03-26 LAB — TSH: TSH: 2.97 u[IU]/mL (ref 0.35–4.50)

## 2016-03-26 LAB — CBC
HCT: 38.3 % (ref 36.0–46.0)
HEMOGLOBIN: 12.8 g/dL (ref 12.0–15.0)
MCHC: 33.4 g/dL (ref 30.0–36.0)
MCV: 85.6 fl (ref 78.0–100.0)
Platelets: 243 10*3/uL (ref 150.0–400.0)
RBC: 4.48 Mil/uL (ref 3.87–5.11)
RDW: 14.5 % (ref 11.5–15.5)
WBC: 5.4 10*3/uL (ref 4.0–10.5)

## 2016-03-26 LAB — VITAMIN D 25 HYDROXY (VIT D DEFICIENCY, FRACTURES): VITD: 20.66 ng/mL — AB (ref 30.00–100.00)

## 2016-03-26 NOTE — Addendum Note (Signed)
Addended by: Jacqualin Combes on: 03/26/2016 11:07 AM   Modules accepted: Orders

## 2016-03-26 NOTE — Assessment & Plan Note (Signed)
Due for screening mammogram, [facility]. She will call to schedule, phone number provided.

## 2016-03-26 NOTE — Progress Notes (Signed)
Subjective:    Patient ID: Evelyn Shepherd, female    DOB: December 22, 1960, 56 y.o.   MRN: [id number]  HPI  [name] is a 56 year old female who presents today for complete physical.  Overall doing well except for low energy levels. She has no motivation to do anything when she gets home from work. Denies depression and anxiety. She has a history of thyroid disorder and anemia which has resolved. She has a family history of thyroid disorders.  Immunizations: -Tetanus: Unsure, believes it's been over 10 years.  -Influenza: Completed in Fall 2017   Diet: She endorses a healthy diet. Breakfast: Boiled egg, bacon Lunch: Salad with protein, veggies Dinner: Meat, starch Snacks: Veggies Desserts: Occasionally Beverages: Coffee, Coke Zero, Un-sweet tea with Splenda, little water  Exercise: She does not currently exercise. Eye exam: Completed in 2018 Dental exam: Completes every 6 months Colonoscopy: Completed in 01/2015, due in 5  Pap Smear: Completed in 2015, completes every 5 years. Mammogram: Due.    Review of Systems  Constitutional: Positive for fatigue. Negative for unexpected weight change.  HENT: Negative for rhinorrhea.   Respiratory: Negative for cough and shortness of breath.   Cardiovascular: Negative for chest pain.  Gastrointestinal: Negative for constipation and diarrhea.  Genitourinary: Negative for difficulty urinating and menstrual problem.  Musculoskeletal: Negative for arthralgias and myalgias.  Skin: Negative for rash.  Allergic/Immunologic: Negative for environmental allergies.  Neurological: Negative for dizziness, numbness and headaches.  Psychiatric/Behavioral:       She denies depression and anxiety concerns.       Past Medical History:  Diagnosis Date  . Diabetes mellitus ([place])    Type II, 3/07  . History of depression   . History of gastroesophageal reflux (GERD)   . History of hyperlipidemia   . History of migraine   . History of obesity    lap  band surgery  . History of pneumonia 2005     Social History   Social History  . Marital status: Married    Spouse name: N/A  . Number of children: N/A  . Years of education: N/A   Occupational History  . Not on file.   Social History Main Topics  . Smoking status: Former Smoker    Quit date: 04/05/2005  . Smokeless tobacco: Never Used  . Alcohol use 0.0 oz/week     Comment: occasional on monthly basis  . Drug use: No  . Sexual activity: Not on file   Other Topics Concern  . Not on file   Social History Narrative  . No narrative on file    Past Surgical History:  Procedure Laterality Date  . BREAST ENHANCEMENT SURGERY    . CESAREAN SECTION    . DENTAL SURGERY    . LAPAROSCOPIC GASTRIC BANDING  07/31/07  . LUMBAR DISC SURGERY    . TONGUE SURGERY  03/2005   lesion removal    Family History  Problem Relation Age of Onset  . Kidney cancer Mother   . Diabetes Mother   . Pneumonia Father     died  . Lung cancer Father   . Depression Brother     commited suicide  . Alcohol abuse Brother   . Early menopause Other     family  . Colon cancer Neg Hx   . Pancreatic cancer Neg Hx   . Rectal cancer Neg Hx   . Stomach cancer Neg Hx     Allergies  Allergen Reactions  .  Meloxicam     REACTION: unspecified  . Sulfonamide Derivatives     REACTION: unspecified    Current Outpatient Prescriptions on File Prior to Visit  Medication Sig Dispense Refill  . atorvastatin (LIPITOR) 10 MG tablet Take 1 tablet (10 mg total) by mouth daily. 90 tablet 3  . escitalopram (LEXAPRO) 20 MG tablet TAKE 1 TABLET BY MOUTH ONCE DAILY 90 tablet 1  . ketoconazole (NIZORAL) 2 % cream APPLY TO AREAS OF FACE THAT ARE AFFECTED ONCE DAILY 60 g 2  . metaxalone (SKELAXIN) 800 MG tablet TAKE 1 TABLET BY MOUTH 3 TIMES DAILY AS NEEDED FOR BACK PAIN (MAY CAUSE SEDATION) 30 tablet 1  . SUMAtriptan (IMITREX) 100 MG tablet TAKE 1 TABLET BY MOUTH DAILY AS NEEDED FOR MIGRAINE. 36 tablet 2   No  current facility-administered medications on file prior to visit.     BP 122/70   Pulse 72   Temp 98.2 F (36.8 C) (Oral)   Ht 5\' 5"  (1.651 m)   Wt 225 lb (102.1 kg)   SpO2 95%   BMI 37.44 kg/m    Objective:   Physical Exam  Constitutional: She is oriented to person, place, and time. She appears well-nourished.  HENT:  Right Ear: Tympanic membrane and ear canal normal.  Left Ear: Tympanic membrane and ear canal normal.  Nose: Nose normal.  Mouth/Throat: Oropharynx is clear and moist.  Eyes: Conjunctivae and EOM are normal. Pupils are equal, round, and reactive to light.  Neck: Neck supple. No thyromegaly present.  Cardiovascular: Normal rate and regular rhythm.   No murmur heard. Pulmonary/Chest: Effort normal and breath sounds normal. She has no rales.  Abdominal: Soft. Bowel sounds are normal. There is no tenderness.  Musculoskeletal: Normal range of motion.  Lymphadenopathy:    She has no cervical adenopathy.  Neurological: She is alert and oriented to person, place, and time. She has normal reflexes. No cranial nerve deficit.  Skin: Skin is warm and dry. No rash noted.  Psychiatric: She has a normal mood and affect.          Assessment & Plan:

## 2016-03-26 NOTE — Assessment & Plan Note (Addendum)
Increased fatigue, check TSH today given history. Fatigue likely due to lack of physical fitness/activity given normal TSH readings on subsequent years. Discussed to start exercising.

## 2016-03-26 NOTE — Assessment & Plan Note (Signed)
Td unsure, provided today.  Pap UTD, due in 2020. Mammogram due, ordered and pending. Colonoscopy UTD. Exam unremarkable. Labs pending. Discussed the importance of a healthy diet and regular exercise in order for weight loss, and to reduce the risk of other medical diseases.

## 2016-03-26 NOTE — Patient Instructions (Addendum)
Complete lab work prior to leaving today. I will notify you of your results once received.   Continue your efforts towards a healthy diet. Increase consumption of fresh vegetables and fruits, whole grains, water.  Ensure you are drinking 64 ounces of water daily.  Start exercising. You should be getting 150 minutes of moderate intensity exercise weekly.  You were provided with a tetanus vaccination that will cover you for 10 years.   [facility] your [facility].  Follow up in 1 year for your annual exam or sooner if needed.  It was a pleasure meeting you!

## 2016-03-26 NOTE — Assessment & Plan Note (Signed)
Ongoing for a while, check labs today. Discussed the importance of a healthy diet and regular exercise in order for weight loss, and to increase energy levels. Don't suspect depression or anxiety.

## 2016-03-26 NOTE — Assessment & Plan Note (Signed)
History of prediabetes per A1C in 2016, recheck today. Encouraged weight loss.

## 2016-03-26 NOTE — Progress Notes (Signed)
Pre visit review using our clinic review tool, if applicable. No additional management support is needed unless otherwise documented below in the visit note. 

## 2016-03-26 NOTE — Assessment & Plan Note (Signed)
Check lipids today. Encouraged healthy diet and regular exercise.

## 2016-03-29 ENCOUNTER — Encounter: Payer: Self-pay | Admitting: Primary Care

## 2016-03-29 DIAGNOSIS — E785 Hyperlipidemia, unspecified: Secondary | ICD-10-CM

## 2016-03-30 ENCOUNTER — Other Ambulatory Visit: Payer: Self-pay | Admitting: Primary Care

## 2016-03-30 DIAGNOSIS — E6609 Other obesity due to excess calories: Secondary | ICD-10-CM

## 2016-03-30 DIAGNOSIS — R7303 Prediabetes: Secondary | ICD-10-CM

## 2016-03-30 MED ORDER — ATORVASTATIN CALCIUM 10 MG PO TABS: 10.0000 mg | ORAL_TABLET | Freq: Every day | ORAL | 0 refills | Status: DC

## 2016-03-30 MED FILL — ATORVASTATIN 10 MG TABLET: 10 | 90 days supply | Qty: 90 | Fill #0

## 2016-04-06 NOTE — Telephone Encounter (Signed)
Rosaria Ferries or Shirlean Mylar, see My Chart message regarding nutritionist referral. Can you contact her?

## 2016-04-21 ENCOUNTER — Ambulatory Visit: Payer: 59 | Admitting: Dietician

## 2016-04-29 ENCOUNTER — Encounter: Payer: Self-pay | Admitting: Registered"

## 2016-04-29 ENCOUNTER — Encounter: Payer: 59 | Attending: Family Medicine | Admitting: Registered"

## 2016-04-29 DIAGNOSIS — R7303 Prediabetes: Secondary | ICD-10-CM | POA: Insufficient documentation

## 2016-04-29 DIAGNOSIS — Z713 Dietary counseling and surveillance: Secondary | ICD-10-CM | POA: Diagnosis not present

## 2016-04-29 DIAGNOSIS — E6609 Other obesity due to excess calories: Secondary | ICD-10-CM | POA: Insufficient documentation

## 2016-04-29 DIAGNOSIS — R739 Hyperglycemia, unspecified: Secondary | ICD-10-CM

## 2016-04-29 DIAGNOSIS — Z6836 Body mass index (BMI) 36.0-36.9, adult: Secondary | ICD-10-CM | POA: Diagnosis not present

## 2016-04-29 NOTE — Progress Notes (Signed)
Medical Nutrition Therapy:  Appt start time: 1520 end time:  1620.   Assessment:  Primary concerns today: Prediabetes and weight loss. Pt states s/p lap band 2009 with ~80 lbs weight loss and regained ~50 lbs. Pt states changes caused by surgery, e.g.  not being able to drink with meals and some food (bread) doesn't stay down.  Pt states she is very motivated to make some healthy lifestyle changes and wants to have follow-up appointments to help with accountability. Pt states she  plans to keep a food diary. RD recommended to only do this as long as does not cause additional stress.  Pt states she does not drink enough water and feels she may be a little dehydrated.   Physical activity. Pt states she used to go to group aerobic classes which she enjoyed, but only ADLs now.   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  Pt lives with husband. Pt states she cooks batches of meat for convenience to add to easy dishes such as salads.   Preferred Learning Style:   No preference indicated   Learning Readiness:   Ready  MEDICATIONS: reviewed   DIETARY INTAKE:  Usual eating pattern includes 2-3 meals and 0-1 snacks per day. Doesn't like greens, was eating ice cream every day.  24-hr recall:  B ( AM): boiled egg, coffee splenda, creamer  Snk ( AM): none  L ( PM): salad, baked chicken Snk ( PM): none OR crackers D ( PM): chicken, rice OR pocorn Snk ( PM): popcorn OR ice cream  Beverages: coffee, ice tea unsweet, coke zero, little to no water  Usual physical activity: ADLs  Estimated energy needs: 1800 calories  Progress Towards Goal(s):  In progress.   Nutritional Diagnosis:  NI-5.8.4 Inconsistent carbohydrate intake As related to most meals missing carbohydrate.  As evidenced by diet recall.    Intervention:  Nutrition Education for managing blood glucose with diet and lifestyle changes. Described the role of  different macronutrients on glucose.  Explained how carbohydrates affect blood glucose.  Stated what foods contain the most carbohydrates.  Demonstrated carbohydrate counting. Described the role of exercise in health and blood glucose control. Explained the role of stress in food choices and glucose control. Discussed expectations from making lifestyle changes which may or may not result in weight loss.  Goals:  Take some time to take care of yourself. Set an alarm to close the office for a few minutes, get some water, take some deep breaths, stairs.  Plan ahead for occassions to work in [place].  Think about ways to work in more exercise, consider [employer] for an afternoon walk.  Aim for 3 meals a day, 2-3 carb choices  Snacks 0-1 carb choices  Include protein with meals and snacks  Consider including more beans in your diet  Teaching Method Utilized:  Visual Auditory  Handouts given during visit include:  Carb counting   My Plate  Barriers to learning/adherence to lifestyle change: none  Demonstrated degree of understanding via:  Teach Back   Monitoring/Evaluation:  Dietary intake, exercise, and body weight in 1 month(s).

## 2016-04-29 NOTE — Patient Instructions (Addendum)
   Take some time to take care of yourself. Set an alarm to close the office for a few minutes, get some water, take some deep breaths, stairs.  Plan ahead for occassions to work in [place].  Think about ways to work in more exercise, consider [employer] for an afternoon walk.  Aim for 3 meals a day, 2-3 carb choices  Snacks 0-1 carb choices  Include protein with meals and snacks  Consider including more beans in your diet

## 2016-05-03 ENCOUNTER — Ambulatory Visit
Admission: RE | Admit: 2016-05-03 | Discharge: 2016-05-03 | Disposition: A | Discharge status: Home / Self Care | Payer: 59 | Source: Ambulatory Visit | Attending: Primary Care | Admitting: Primary Care

## 2016-05-03 DIAGNOSIS — Z1239 Encounter for other screening for malignant neoplasm of breast: Secondary | ICD-10-CM

## 2016-05-03 DIAGNOSIS — Z1231 Encounter for screening mammogram for malignant neoplasm of breast: Secondary | ICD-10-CM | POA: Diagnosis not present

## 2016-06-02 ENCOUNTER — Ambulatory Visit: Payer: 59 | Admitting: Registered"

## 2016-06-11 ENCOUNTER — Telehealth: Payer: 59 | Admitting: Family

## 2016-06-11 DIAGNOSIS — J029 Acute pharyngitis, unspecified: Secondary | ICD-10-CM

## 2016-06-11 MED ORDER — BENZONATATE 100 MG PO CAPS: 100.0000 mg | ORAL_CAPSULE | Freq: Three times a day (TID) | ORAL | 0 refills | Status: DC | PRN

## 2016-06-11 MED ORDER — PREDNISONE 5 MG PO TABS: 5.0000 mg | ORAL_TABLET | ORAL | 0 refills | Status: DC

## 2016-06-11 MED FILL — predniSONE 5 MG TABS: 5 | 6 days supply | Qty: 21 | Fill #0

## 2016-06-11 MED FILL — BENZONATATE 100 MG CAP: 100 | 5 days supply | Qty: 30 | Fill #0

## 2016-06-11 NOTE — Progress Notes (Signed)
Thank you for the details you put in the comment boxes. Those details really help [place] take better care of you.   We are sorry that you are not feeling well.  Here is how we plan to help!  Based on what you have shared with me it looks like you have upper respiratory tract inflammation that has resulted in a significant cough.  Inflammation and infection in the upper respiratory tract is commonly called bronchitis and has four common causes:  Allergies, Viral Infections, Acid Reflux and Bacterial Infections.  Allergies, viruses and acid reflux are treated by controlling symptoms or eliminating the cause. An example might be a cough caused by taking certain blood pressure medications. You stop the cough by changing the medication. Another example might be a cough caused by acid reflux. Controlling the reflux helps control the cough.  Based on your presentation I believe you most likely have A cough due to allergies.  I recommend that you start the an over-the counter-allergy medication such as Claritin 10 mg or Zyrtec 10 mg daily.     In addition you may use A prescription cough medication called Tessalon Perles 100mg . You may take 1-2 capsules every 8 hours as needed for your cough.  Sterapred 5 mg dosepak   This could also be a respiratory virus instead of allergies but the treatment is primarily the same aside from the claritin as above.   USE OF BRONCHODILATOR ("RESCUE") INHALERS: There is a risk from using your bronchodilator too frequently.  The risk is that over-reliance on a medication which only relaxes the muscles surrounding the breathing tubes can reduce the effectiveness of medications prescribed to reduce swelling and congestion of the tubes themselves.  Although you feel brief relief from the bronchodilator inhaler, your asthma may actually be worsening with the tubes becoming more swollen and filled with mucus.  This can delay other crucial treatments, such as oral steroid medications. If  you need to use a bronchodilator inhaler daily, several times per day, you should discuss this with your provider.  There are probably better treatments that could be used to keep your asthma under control.     HOME CARE . Only take medications as instructed by your medical team. . Complete the entire course of an antibiotic. . Drink plenty of fluids and get plenty of rest. . Avoid close contacts especially the very young and the elderly . Cover your mouth if you cough or cough into your sleeve. . Always remember to wash your hands . A steam or ultrasonic humidifier can help congestion.   GET HELP RIGHT AWAY IF: . You develop worsening fever. . You become short of breath . You cough up blood. . Your symptoms persist after you have completed your treatment plan MAKE SURE YOU   Understand these instructions.  Will watch your condition.  Will get help right away if you are not doing well or get worse.  Your e-visit answers were reviewed by a board certified advanced clinical practitioner to complete your personal care plan.  Depending on the condition, your plan could have included both over the counter or prescription medications. If there is a problem please reply  once you have received a response from your provider. Your safety is important to [place].  If you have drug allergies check your prescription carefully.    You can use MyChart to ask questions about today's visit, request a non-urgent call back, or ask for a work or school excuse for 24  for 24 hours related to this e-Visit. If it has been greater than 24 hours you will need to follow up with your provider, or enter a new e-Visit to address those concerns. You will get an e-mail in the next two days asking about your experience.  I hope that your e-visit has been valuable and will speed your recovery. Thank you for using e-visits.   

## 2016-06-15 NOTE — Progress Notes (Signed)
Based on what you shared with me it looks like you have a serious condition that should be evaluated in a face to face office visit.  NOTE: Even if you have entered your credit card information for this eVisit, you will not be charged.   If you are having a true medical emergency please call 911.  If you need an urgent face to face visit, [place] has four urgent care centers for your convenience.  If you need care fast and have a high deductible or no insurance consider:   [email or web address]  [phone]  [address], [address] [place], [place] [address] 8 am to 8 pm Monday-Friday 10 am to 4 pm Saturday-Sunday   The following sites will take your  insurance:    . [facility]  [phone] Get Driving Directions Find a Provider at this Location  [address] [place], [place] [address] . 10 am to 8 pm Monday-Friday . 12 pm to 8 pm Saturday-Sunday   . [facility]  [phone] Get Driving Directions Find a Provider at this Location  [address], [address] [place], [place] [address] . 8 am to 8 pm Monday-Friday . 9 am to 6 pm Saturday . 11 am to 6 pm Sunday   . [facility] [place]  [phone] Get Driving Directions  [address] [place], [place] [address] . 8 am to 8 pm Monday-Friday . 8 am to 4 pm Saturday-Sunday   Your e-visit answers were reviewed by a board certified advanced clinical practitioner to complete your personal care plan.  Thank you for using e-Visits.  

## 2016-08-03 ENCOUNTER — Other Ambulatory Visit: Payer: Self-pay | Admitting: Primary Care

## 2016-08-03 ENCOUNTER — Other Ambulatory Visit: Payer: Self-pay | Admitting: Family Medicine

## 2016-08-03 DIAGNOSIS — E785 Hyperlipidemia, unspecified: Secondary | ICD-10-CM

## 2016-08-03 MED FILL — ESCITALOPRAM 20 MG TABLET: 20 | 90 days supply | Qty: 90 | Fill #1

## 2016-08-03 MED FILL — METAXALONE 800 MG TABLET: 800 | 10 days supply | Qty: 30 | Fill #1

## 2016-08-03 MED FILL — SUMATRIPTAN SUCC 100 MG TAB: 100 | 60 days supply | Qty: 36 | Fill #1

## 2016-08-03 MED FILL — KETOCONAZOLE 2% CREAM: 2 | 10 days supply | Qty: 60 | Fill #0

## 2016-08-03 MED FILL — ATORVASTATIN 10 MG TABLET: 10 | 90 days supply | Qty: 90 | Fill #0

## 2016-08-03 NOTE — Telephone Encounter (Signed)
Please refill times 3 

## 2016-08-03 NOTE — Telephone Encounter (Signed)
Pt had a CPE on 03/26/16, last filled on 11/26/14 #60 g with 2 additional refills, please advise

## 2016-11-03 ENCOUNTER — Other Ambulatory Visit: Payer: Self-pay | Admitting: Family Medicine

## 2016-11-03 MED FILL — ESCITALOPRAM 20 MG TABLET: 20 | 90 days supply | Qty: 90 | Fill #0

## 2016-11-03 MED FILL — SUMATRIPTAN SUCC 100 MG TAB: 100 | 60 days supply | Qty: 36 | Fill #2

## 2016-11-03 MED FILL — ATORVASTATIN 10 MG TABLET: 10 | 90 days supply | Qty: 90 | Fill #1

## 2016-11-03 NOTE — Telephone Encounter (Signed)
Last refill 03/12/16 #90 +1  Last OV 03/26/16 with [facility].  Ok to refill?

## 2016-11-03 NOTE — Telephone Encounter (Signed)
Will refill electronically  

## 2016-11-27 DIAGNOSIS — Z1231 Encounter for screening mammogram for malignant neoplasm of breast: Secondary | ICD-10-CM | POA: Diagnosis not present

## 2016-11-29 ENCOUNTER — Encounter: Payer: Self-pay | Admitting: Family Medicine

## 2016-12-17 ENCOUNTER — Telehealth: Payer: 59 | Admitting: Family

## 2016-12-17 DIAGNOSIS — J028 Acute pharyngitis due to other specified organisms: Secondary | ICD-10-CM | POA: Diagnosis not present

## 2016-12-17 DIAGNOSIS — B9689 Other specified bacterial agents as the cause of diseases classified elsewhere: Secondary | ICD-10-CM | POA: Diagnosis not present

## 2016-12-17 MED ORDER — PREDNISONE 5 MG PO TABS: 5.0000 mg | ORAL_TABLET | ORAL | 0 refills | Status: DC

## 2016-12-17 MED ORDER — BENZONATATE 100 MG PO CAPS: 100.0000 mg | ORAL_CAPSULE | Freq: Three times a day (TID) | ORAL | 0 refills | Status: DC | PRN

## 2016-12-17 MED ORDER — AZITHROMYCIN 250 MG PO TABS: ORAL_TABLET | ORAL | 0 refills | Status: DC

## 2016-12-17 MED FILL — predniSONE 5 MG TABS: 5 | 6 days supply | Qty: 21 | Fill #0

## 2016-12-17 MED FILL — BENZONATATE 100 MG CAPS: 100 | 5 days supply | Qty: 30 | Fill #0

## 2016-12-17 MED FILL — AZITHROMYCIN 250 MG TABLET: 250 | 5 days supply | Qty: 6 | Fill #0

## 2016-12-17 NOTE — Progress Notes (Signed)

## 2017-02-06 ENCOUNTER — Telehealth: Payer: 59 | Admitting: Family

## 2017-02-06 DIAGNOSIS — J029 Acute pharyngitis, unspecified: Secondary | ICD-10-CM | POA: Diagnosis not present

## 2017-02-06 MED ORDER — BENZONATATE 100 MG PO CAPS: 100.0000 mg | ORAL_CAPSULE | Freq: Three times a day (TID) | ORAL | 0 refills | Status: DC | PRN

## 2017-02-06 MED ORDER — PREDNISONE 5 MG PO TABS: 5.0000 mg | ORAL_TABLET | ORAL | 0 refills | Status: DC

## 2017-02-06 NOTE — Progress Notes (Signed)
Thank you for the details you included in the comment boxes. Those details are very helpful in determining the best course of treatment for you and help [place] to provide the best care.  We are sorry that you are not feeling well.  Here is how we plan to help!  Based on your presentation I believe you most likely have a sinus infection and A cough due to a virus.  This is called viral bronchitis and is best treated by rest, plenty of fluids and control of the cough.  You may use Ibuprofen or Tylenol as directed to help your symptoms.     In addition you may use A non-prescription cough medication called Mucinex DM: take 2 tablets every 12 hours. and A prescription cough medication called Tessalon Perles 100mg . You may take 1-2 capsules every 8 hours as needed for your cough.  Sterapred 5 mg dosepak and continue using the flonase you have.   From your responses in the eVisit questionnaire you describe inflammation in the upper respiratory tract which is causing a significant cough.  This is commonly called Bronchitis and has four common causes:    Allergies  Viral Infections  Acid Reflux  Bacterial Infection Allergies, viruses and acid reflux are treated by controlling symptoms or eliminating the cause. An example might be a cough caused by taking certain blood pressure medications. You stop the cough by changing the medication. Another example might be a cough caused by acid reflux. Controlling the reflux helps control the cough.  USE OF BRONCHODILATOR ("RESCUE") INHALERS: There is a risk from using your bronchodilator too frequently.  The risk is that over-reliance on a medication which only relaxes the muscles surrounding the breathing tubes can reduce the effectiveness of medications prescribed to reduce swelling and congestion of the tubes themselves.  Although you feel brief relief from the bronchodilator inhaler, your asthma may actually be worsening with the tubes becoming more swollen and  filled with mucus.  This can delay other crucial treatments, such as oral steroid medications. If you need to use a bronchodilator inhaler daily, several times per day, you should discuss this with your provider.  There are probably better treatments that could be used to keep your asthma under control.     HOME CARE . Only take medications as instructed by your medical team. . Complete the entire course of an antibiotic. . Drink plenty of fluids and get plenty of rest. . Avoid close contacts especially the very young and the elderly . Cover your mouth if you cough or cough into your sleeve. . Always remember to wash your hands . A steam or ultrasonic humidifier can help congestion.   GET HELP RIGHT AWAY IF: . You develop worsening fever. . You become short of breath . You cough up blood. . Your symptoms persist after you have completed your treatment plan MAKE SURE YOU   Understand these instructions.  Will watch your condition.  Will get help right away if you are not doing well or get worse.  Your e-visit answers were reviewed by a board certified advanced clinical practitioner to complete your personal care plan.  Depending on the condition, your plan could have included both over the counter or prescription medications. If there is a problem please reply  once you have received a response from your provider. Your safety is important to [place].  If you have drug allergies check your prescription carefully.    You can use MyChart to ask questions about  today's visit, request a non-urgent call back, or ask for a work or school excuse for 24 hours related to this e-Visit. If it has been greater than 24 hours you will need to follow up with your provider, or enter a new e-Visit to address those concerns. You will get an e-mail in the next two days asking about your experience.  I hope that your e-visit has been valuable and will speed your recovery. Thank you for using e-visits.

## 2017-02-28 ENCOUNTER — Encounter (HOSPITAL_COMMUNITY): Payer: Self-pay

## 2017-03-03 DIAGNOSIS — H524 Presbyopia: Secondary | ICD-10-CM | POA: Diagnosis not present

## 2017-03-22 ENCOUNTER — Other Ambulatory Visit: Payer: Self-pay | Admitting: Family Medicine

## 2017-03-22 MED FILL — ESCITALOPRAM 20 MG TABLET: 20 | 90 days supply | Qty: 90 | Fill #1

## 2017-03-23 MED FILL — METAXALONE 800 MG TABLET: 800 | 10 days supply | Qty: 30 | Fill #0

## 2017-03-23 MED FILL — SUMATRIPTAN SUCC 100 MG TAB: 100 | 90 days supply | Qty: 24 | Fill #0

## 2017-03-23 NOTE — Telephone Encounter (Signed)
Please schedule f/u and refill until then Thanks  

## 2017-03-23 NOTE — Telephone Encounter (Signed)
No recent or future appts., please advise  

## 2017-03-23 NOTE — Telephone Encounter (Signed)
appt scheduled and med filled  

## 2017-03-25 ENCOUNTER — Ambulatory Visit: Payer: 59 | Admitting: Primary Care

## 2017-03-25 ENCOUNTER — Encounter: Payer: Self-pay | Admitting: Primary Care

## 2017-03-25 VITALS — BP 118/74 | HR 79 | Temp 98.6°F | Ht 65.0 in | Wt 230.2 lb

## 2017-03-25 DIAGNOSIS — H6982 Other specified disorders of Eustachian tube, left ear: Secondary | ICD-10-CM | POA: Diagnosis not present

## 2017-03-25 MED ORDER — FLUTICASONE PROPIONATE 50 MCG/ACT NA SUSP: 1.0000 | Freq: Two times a day (BID) | NASAL | 0 refills | Status: DC

## 2017-03-25 NOTE — Progress Notes (Signed)
Subjective:    Patient ID: Evelyn Shepherd, female    DOB: February 02, 1960, 57 y.o.   MRN: [id number]  HPI  Evelyn Shepherd is a 57 year old female who presents today with a chief complaint of ear pain. Her pain is located to the left ear which has been present intermittently for the past 6 weeks. Her pain will radiate to her left jaw.   She's been taking Tylenol, Ibuprofen, trying Debrox drops without improvement. Her ear is not bothering her today. She denies sore throat, fevers, chills, cough, chest pain.   Review of Systems  Constitutional: Negative for fever.  HENT: Positive for ear pain. Negative for sinus pressure and sore throat.   Respiratory: Negative for cough.   Cardiovascular: Negative for chest pain.       Past Medical History:  Diagnosis Date  . Diabetes mellitus ([place])    Type II, 3/07  . History of depression   . History of gastroesophageal reflux (GERD)   . History of hyperlipidemia   . History of migraine   . History of obesity    lap band surgery  . History of pneumonia 2005     Social History   Socioeconomic History  . Marital status: Married    Spouse name: Not on file  . Number of children: Not on file  . Years of education: Not on file  . Highest education level: Not on file  Social Needs  . Financial resource strain: Not on file  . Food insecurity - worry: Not on file  . Food insecurity - inability: Not on file  . Transportation needs - medical: Not on file  . Transportation needs - non-medical: Not on file  Occupational History  . Not on file  Tobacco Use  . Smoking status: Former Smoker    Last attempt to quit: 04/05/2005    Years since quitting: 11.9  . Smokeless tobacco: Never Used  Substance and Sexual Activity  . Alcohol use: Yes    Alcohol/week: 0.0 oz    Comment: occasional on monthly basis  . Drug use: No  . Sexual activity: Not on file  Other Topics Concern  . Not on file  Social History Narrative  . Not on file    Past Surgical  History:  Procedure Laterality Date  . AUGMENTATION MAMMAPLASTY Bilateral 1991  . BREAST ENHANCEMENT SURGERY    . CESAREAN SECTION    . DENTAL SURGERY    . LAPAROSCOPIC GASTRIC BANDING  07/31/07  . LUMBAR DISC SURGERY    . TONGUE SURGERY  03/2005   lesion removal    Family History  Problem Relation Age of Onset  . Pneumonia Father        died  . Lung cancer Father   . Kidney cancer Mother   . Diabetes Mother   . Depression Brother        commited suicide  . Alcohol abuse Brother   . Early menopause Other        family  . Breast cancer Paternal Aunt   . Breast cancer Paternal Aunt   . Colon cancer Neg Hx   . Pancreatic cancer Neg Hx   . Rectal cancer Neg Hx   . Stomach cancer Neg Hx     Allergies  Allergen Reactions  . Meloxicam     REACTION: unspecified  . Sulfonamide Derivatives     REACTION: unspecified    Current Outpatient Medications on File Prior to Visit  Medication Sig Dispense  Refill  . atorvastatin (LIPITOR) 10 MG tablet TAKE 1 TABLET (10 MG TOTAL) BY MOUTH DAILY. 90 tablet 1  . escitalopram (LEXAPRO) 20 MG tablet TAKE 1 TABLET BY MOUTH ONCE DAILY 90 tablet 1  . ketoconazole (NIZORAL) 2 % cream APPLY TO AREAS OF FACE THAT ARE AFFECTED ONCE DAILY 60 g 2  . metaxalone (SKELAXIN) 800 MG tablet TAKE 1 TABLET BY MOUTH 3 TIMES DAILY AS NEEDED FOR BACK PAIN 30 tablet 1  . predniSONE (DELTASONE) 5 MG tablet Take 1 tablet (5 mg total) by mouth as directed. sterapred generic 21 tablet 0  . SUMAtriptan (IMITREX) 100 MG tablet TAKE 1 TABLET BY MOUTH DAILY AS NEEDED FOR MIGRAINE. 36 tablet 0   No current facility-administered medications on file prior to visit.     BP 118/74   Pulse 79   Temp 98.6 F (37 C) (Oral)   Ht 5\' 5"  (1.651 m)   Wt 230 lb 4 oz (104.4 kg)   SpO2 95%   BMI 38.32 kg/m    Objective:   Physical Exam  Constitutional: She appears well-nourished.  HENT:  Right Ear: Ear canal normal. Tympanic membrane is bulging. Tympanic membrane is not  erythematous.  Left Ear: Ear canal normal. Tympanic membrane is retracted. Tympanic membrane is not erythematous.  Nose: Right sinus exhibits no maxillary sinus tenderness and no frontal sinus tenderness. Left sinus exhibits no maxillary sinus tenderness and no frontal sinus tenderness.  Mouth/Throat: Oropharynx is clear and moist.  Eyes: Conjunctivae are normal.  Neck: Neck supple.  Cardiovascular: Normal rate and regular rhythm.  Pulmonary/Chest: Effort normal and breath sounds normal. She has no wheezes. She has no rales.  Lymphadenopathy:    She has no cervical adenopathy.  Skin: Skin is warm and dry.          Assessment & Plan:  Eustachian Tube Dysfunction:  Left ear pain x 6 weeks, intermittent.  Exam today without evidence of infection, cerumen impaction, foreign body. Likely eustachian tube dysfunction and will start with conservative treatment. Rx for Flonase sent to pharmacy, discussed directions for use. She'll update in 1 week if no improvement.  Pleas Koch, NP

## 2017-03-25 NOTE — Patient Instructions (Signed)
Ear Pressure: Try using Flonase (fluticasone) nasal spray. Instill 1 spray in each nostril twice daily.   Please update me in 1 week.  It was a pleasure to see you today!

## 2017-04-20 ENCOUNTER — Encounter: Payer: 59 | Admitting: Family Medicine

## 2017-04-22 ENCOUNTER — Other Ambulatory Visit: Payer: Self-pay | Admitting: Primary Care

## 2017-04-22 DIAGNOSIS — H6982 Other specified disorders of Eustachian tube, left ear: Secondary | ICD-10-CM

## 2017-06-08 ENCOUNTER — Encounter: Payer: Self-pay | Admitting: Family Medicine

## 2017-06-08 ENCOUNTER — Ambulatory Visit (INDEPENDENT_AMBULATORY_CARE_PROVIDER_SITE_OTHER): Payer: 59 | Admitting: Family Medicine

## 2017-06-08 VITALS — BP 110/68 | HR 96 | Temp 98.0°F | Ht 65.0 in | Wt 226.0 lb

## 2017-06-08 DIAGNOSIS — Z87891 Personal history of nicotine dependence: Secondary | ICD-10-CM | POA: Insufficient documentation

## 2017-06-08 DIAGNOSIS — E785 Hyperlipidemia, unspecified: Secondary | ICD-10-CM

## 2017-06-08 DIAGNOSIS — R739 Hyperglycemia, unspecified: Secondary | ICD-10-CM

## 2017-06-08 DIAGNOSIS — F17200 Nicotine dependence, unspecified, uncomplicated: Secondary | ICD-10-CM | POA: Diagnosis not present

## 2017-06-08 DIAGNOSIS — Z0000 Encounter for general adult medical examination without abnormal findings: Secondary | ICD-10-CM | POA: Diagnosis not present

## 2017-06-08 DIAGNOSIS — E039 Hypothyroidism, unspecified: Secondary | ICD-10-CM

## 2017-06-08 DIAGNOSIS — Z23 Encounter for immunization: Secondary | ICD-10-CM

## 2017-06-08 MED ORDER — ATORVASTATIN CALCIUM 10 MG PO TABS: 10.0000 mg | ORAL_TABLET | Freq: Every day | ORAL | 3 refills | Status: DC

## 2017-06-08 MED ORDER — VARENICLINE TARTRATE 1 MG PO TABS: 1.0000 mg | ORAL_TABLET | Freq: Two times a day (BID) | ORAL | 5 refills | Status: DC

## 2017-06-08 MED ORDER — SUMATRIPTAN SUCCINATE 100 MG PO TABS: ORAL_TABLET | ORAL | 3 refills | Status: DC

## 2017-06-08 MED ORDER — VARENICLINE TARTRATE 0.5 MG X 11 & 1 MG X 42 PO MISC: ORAL | 0 refills | Status: DC

## 2017-06-08 MED ORDER — ESCITALOPRAM OXALATE 20 MG PO TABS: 20.0000 mg | ORAL_TABLET | Freq: Every day | ORAL | 3 refills | Status: DC

## 2017-06-08 NOTE — Patient Instructions (Addendum)
Make a game plan to quit smoking  Try chantix again  Also - plan for future stress so you don't smoke   Pneumonia vaccine today   If you are interested in the new shingles vaccine (Shingrix) - call your local pharmacy to check on coverage and availability  If affordable - get on wait list at your pharmacy   Schedule fasting labs for wellness and cholesterol in about a month after starting back on the atorvastatin   Take care of yourself   Please consider stopping the tanning bed due to melanoma risk

## 2017-06-08 NOTE — Progress Notes (Signed)
Subjective:    Patient ID: Evelyn Shepherd, female    DOB: March 21, 1960, 57 y.o.   MRN: [id number]  HPI  Here for health maintenance exam and to review chronic medical problems    Doing well  Retired 2 weeks ago  Is able to eat better -more time to plan  Also dec soft drinks   Wt Readings from Last 3 Encounters:  06/08/17 226 lb (102.5 kg)  03/25/17 230 lb 4 oz (104.4 kg)  04/29/16 225 lb 11.2 oz (102.4 kg)  has started walking since retirement and eating better  37.61 kg/m   Pt started smoking 1ppd (around [place]) - was around others smoking /also stress at work  Would like to have px for chantix   Pap -does with gyn / unsure when last one was -will send for it   Flu shot - did get in the fall  Wants a pneumonia vaccine   Mammogram 11/18 neg  Self breast exam -no lumps   Colonoscopy 1/17  Hyperplastic polyp - 78 y recall   Tetanus shot 3/18   Zoster status -would like shingrix   Hypothyroidism  Pt has no clinical changes No change in energy level/ hair or skin/ edema and no tremor Lab Results  Component Value Date   TSH 2.97 03/26/2016    Due for TSH   Hyperglycemia Lab Results  Component Value Date   HGBA1C 6.1 03/26/2016   Last 5.8 Working harder on diet and exercise  She quit ice cream !- hard for her   Hyperlipidemia Lab Results  Component Value Date   CHOL 252 (H) 03/26/2016   HDL 56.70 03/26/2016   LDLCALC 172 (H) 03/26/2016   LDLDIRECT 187.8 10/22/2011   TRIG 119.0 03/26/2016   CHOLHDL 4 03/26/2016   Never re started her atorvastatin  Will get back on it  Diet is improving    BP Readings from Last 3 Encounters:  06/08/17 110/68  03/25/17 118/74  03/26/16 122/70    Patient Active Problem List   Diagnosis Date Noted  . Smoker 06/08/2017  . History of shingles 01/22/2016  . Bursitis of left hip 01/29/2015  . Low back pain 12/26/2013  . Brachioradial pruritus 11/07/2013  . Fatigue 11/07/2013  . Encounter for routine gynecological  examination 04/11/2013  . Plantar fasciitis 04/11/2013  . Colon cancer screening 02/13/2013  . Screening for breast cancer 04/13/2011  . Special screening for malignant neoplasms, colon 04/13/2011  . Seborrheic dermatitis 04/13/2011  . Routine general medical examination at a health care facility 03/25/2011  . AMENORRHEA 09/18/2008  . Hypothyroidism 08/11/2007  . LOW BACK PAIN, CHRONIC 07/14/2006  . Hyperglycemia 07/06/2006  . Hyperlipidemia 07/06/2006  . DEPRESSION 07/06/2006  . COMMON MIGRAINE 07/06/2006  . GERD 07/06/2006   Past Medical History:  Diagnosis Date  . Diabetes mellitus ([place])    Type II, 3/07  . History of depression   . History of gastroesophageal reflux (GERD)   . History of hyperlipidemia   . History of migraine   . History of obesity    lap band surgery  . History of pneumonia 2005   Past Surgical History:  Procedure Laterality Date  . AUGMENTATION MAMMAPLASTY Bilateral 1991  . BREAST ENHANCEMENT SURGERY    . CESAREAN SECTION    . DENTAL SURGERY    . LAPAROSCOPIC GASTRIC BANDING  07/31/07  . LUMBAR DISC SURGERY    . TONGUE SURGERY  03/2005   lesion removal   Social History   Tobacco  Use  . Smoking status: Current Every Day Smoker    Packs/day: 0.75    Types: Cigarettes  . Smokeless tobacco: Never Used  Substance Use Topics  . Alcohol use: Yes    Alcohol/week: 0.0 oz    Comment: occasional on monthly basis  . Drug use: No   Family History  Problem Relation Age of Onset  . Pneumonia Father        died  . Lung cancer Father   . Kidney cancer Mother   . Diabetes Mother   . Depression Brother        commited suicide  . Alcohol abuse Brother   . Early menopause Other        family  . Breast cancer Paternal Aunt   . Breast cancer Paternal Aunt   . Colon cancer Neg Hx   . Pancreatic cancer Neg Hx   . Rectal cancer Neg Hx   . Stomach cancer Neg Hx    Allergies  Allergen Reactions  . Meloxicam     REACTION: unspecified  . Sulfonamide  Derivatives     REACTION: unspecified   Current Outpatient Medications on File Prior to Visit  Medication Sig Dispense Refill  . fluticasone (FLONASE) 50 MCG/ACT nasal spray PLACE 1 SPRAY INTO BOTH NOSTRILS 2 (TWO) TIMES DAILY. (Patient taking differently: Place 1 spray into both nostrils 2 (two) times daily as needed. ) 16 g 2  . ketoconazole (NIZORAL) 2 % cream APPLY TO AREAS OF FACE THAT ARE AFFECTED ONCE DAILY 60 g 2  . metaxalone (SKELAXIN) 800 MG tablet TAKE 1 TABLET BY MOUTH 3 TIMES DAILY AS NEEDED FOR BACK PAIN 30 tablet 1   No current facility-administered medications on file prior to visit.     Review of Systems  Constitutional: Negative for activity change, appetite change, fatigue, fever and unexpected weight change.  HENT: Negative for congestion, ear pain, rhinorrhea, sinus pressure and sore throat.   Eyes: Negative for pain, redness and visual disturbance.  Respiratory: Negative for cough, shortness of breath and wheezing.   Cardiovascular: Negative for chest pain and palpitations.  Gastrointestinal: Negative for abdominal pain, blood in stool, constipation and diarrhea.  Endocrine: Negative for polydipsia and polyuria.  Genitourinary: Negative for dysuria, frequency and urgency.  Musculoskeletal: Negative for arthralgias, back pain and myalgias.  Skin: Negative for pallor and rash.  Allergic/Immunologic: Negative for environmental allergies.  Neurological: Negative for dizziness, syncope and headaches.  Hematological: Negative for adenopathy. Does not bruise/bleed easily.  Psychiatric/Behavioral: Negative for decreased concentration and dysphoric mood. The patient is not nervous/anxious.        Objective:   Physical Exam  Constitutional: She appears well-developed and well-nourished. No distress.  obese and well appearing   HENT:  Head: Normocephalic and atraumatic.  Right Ear: External ear normal.  Left Ear: External ear normal.  Mouth/Throat: Oropharynx is clear  and moist.  Eyes: Pupils are equal, round, and reactive to light. Conjunctivae and EOM are normal. No scleral icterus.  Neck: Normal range of motion. Neck supple. No JVD present. Carotid bruit is not present. No thyromegaly present.  Cardiovascular: Normal rate, regular rhythm, normal heart sounds and intact distal pulses. Exam reveals no gallop.  Pulmonary/Chest: Effort normal and breath sounds normal. No respiratory distress. She has no wheezes. She exhibits no tenderness. No breast tenderness, discharge or bleeding.  bs are slightly distant  Abdominal: Soft. Bowel sounds are normal. She exhibits no distension, no abdominal bruit and no mass. There is no tenderness.  Genitourinary: No breast tenderness, discharge or bleeding.  Musculoskeletal: Normal range of motion. She exhibits no edema or tenderness.  Lymphadenopathy:    She has no cervical adenopathy.  Neurological: She is alert. She has normal reflexes. No cranial nerve deficit. She exhibits normal muscle tone. Coordination normal.  Skin: Skin is warm and dry. No rash noted. No erythema. No pallor.  Tanned  Solar lentigines diffusely   Psychiatric: She has a normal mood and affect.  Pleasant           Assessment & Plan:   Problem List Items Addressed This Visit      Endocrine   Hypothyroidism    Hypothyroidism  Pt has no clinical changes No change in energy level/ hair or skin/ edema and no tremor TSH today         Other   Hyperglycemia    Lab Results  Component Value Date   HGBA1C 6.1 03/26/2016   This is up from 5.8 disc imp of low glycemic diet and wt loss to prevent DM2       Hyperlipidemia    Needs to re start atorvastatin  Disc goals for lipids and reasons to control them Rev last labs with pt Rev low sat fat diet in detail Check labs a mo after starting       Relevant Medications   atorvastatin (LIPITOR) 10 MG tablet   Routine general medical examination at a health care facility - Primary     Reviewed health habits including diet and exercise and skin cancer prevention Reviewed appropriate screening tests for age  Also reviewed health mt list, fam hx and immunization status , as well as social and family history   See HPI Labs ordered  Enc to quit smoking - px chantix pna 23 vaccine today  Disc shingrix vaccine  Enc wt loss       Smoker    Disc in detail risks of smoking and possible outcomes including copd, vascular/ heart disease, cancer , respiratory and sinus infections  Pt voices understanding Px chantix today       Other Visit Diagnoses    Need for 23-polyvalent pneumococcal polysaccharide vaccine       Relevant Orders   Pneumococcal polysaccharide vaccine 23-valent greater than or equal to 2yo subcutaneous/IM (Completed)

## 2017-06-12 NOTE — Assessment & Plan Note (Signed)
Lab Results  Component Value Date   HGBA1C 6.1 03/26/2016   This is up from 5.8 disc imp of low glycemic diet and wt loss to prevent DM2

## 2017-06-12 NOTE — Assessment & Plan Note (Signed)
Hypothyroidism  °Pt has no clinical changes °No change in energy level/ hair or skin/ edema and no tremor °TSH today ° °

## 2017-06-12 NOTE — Assessment & Plan Note (Signed)
Needs to re start atorvastatin  Disc goals for lipids and reasons to control them Rev last labs with pt Rev low sat fat diet in detail Check labs a mo after starting

## 2017-06-12 NOTE — Assessment & Plan Note (Signed)
Reviewed health habits including diet and exercise and skin cancer prevention Reviewed appropriate screening tests for age  Also reviewed health mt list, fam hx and immunization status , as well as social and family history   See HPI Labs ordered  Enc to quit smoking - px chantix pna 23 vaccine today  Disc shingrix vaccine  Enc wt loss

## 2017-06-12 NOTE — Assessment & Plan Note (Signed)
Disc in detail risks of smoking and possible outcomes including copd, vascular/ heart disease, cancer , respiratory and sinus infections  Pt voices understanding Px chantix today

## 2017-06-21 ENCOUNTER — Encounter: Payer: Self-pay | Admitting: Family Medicine

## 2017-06-22 ENCOUNTER — Encounter: Payer: Self-pay | Admitting: Family Medicine

## 2017-06-22 ENCOUNTER — Ambulatory Visit: Payer: 59 | Admitting: Family Medicine

## 2017-06-22 VITALS — BP 104/68 | HR 77 | Temp 98.3°F | Ht 65.0 in | Wt 227.2 lb

## 2017-06-22 DIAGNOSIS — R109 Unspecified abdominal pain: Secondary | ICD-10-CM | POA: Diagnosis not present

## 2017-06-22 DIAGNOSIS — R319 Hematuria, unspecified: Secondary | ICD-10-CM | POA: Diagnosis not present

## 2017-06-22 DIAGNOSIS — F17200 Nicotine dependence, unspecified, uncomplicated: Secondary | ICD-10-CM | POA: Diagnosis not present

## 2017-06-22 LAB — POC URINALSYSI DIPSTICK (AUTOMATED)
BILIRUBIN UA: NEGATIVE
GLUCOSE UA: NEGATIVE
KETONES UA: NEGATIVE
Leukocytes, UA: NEGATIVE
Nitrite, UA: NEGATIVE
Protein, UA: NEGATIVE
SPEC GRAV UA: 1.025 (ref 1.010–1.025)
Urobilinogen, UA: 0.2 E.U./dL
pH, UA: 6 (ref 5.0–8.0)

## 2017-06-22 NOTE — Progress Notes (Signed)
Subjective:    Patient ID: Evelyn Shepherd, female    DOB: 05-24-1960, 57 y.o.   MRN: [id number]  HPI This is a 57 yo female who presents today with left side pain x 4 days. Noticed red/orange urine, odor. No dysuria, some disrupted flow. One episode of nausea without vomiting. No abdominal pain, no fever. Feels much better today and urine clear and odor resolved. Good water intake, has been cutting back on diet soda and tea. Has history of suspected kidney stones about 5 years ago, no scan done at that time. Resolved spontaneously. Did not take any medications for symptoms.  Started smoking again at end of 2018 after quitting for 12 years.   Past Medical History:  Diagnosis Date  . Diabetes mellitus ([place])    Type II, 3/07  . History of depression   . History of gastroesophageal reflux (GERD)   . History of hyperlipidemia   . History of migraine   . History of obesity    lap band surgery  . History of pneumonia 2005   Past Surgical History:  Procedure Laterality Date  . AUGMENTATION MAMMAPLASTY Bilateral 1991  . BREAST ENHANCEMENT SURGERY    . CESAREAN SECTION    . DENTAL SURGERY    . LAPAROSCOPIC GASTRIC BANDING  07/31/07  . LUMBAR DISC SURGERY    . TONGUE SURGERY  03/2005   lesion removal   Family History  Problem Relation Age of Onset  . Pneumonia Father        died  . Lung cancer Father   . Kidney cancer Mother   . Diabetes Mother   . Depression Brother        commited suicide  . Alcohol abuse Brother   . Early menopause Other        family  . Breast cancer Paternal Aunt   . Breast cancer Paternal Aunt   . Colon cancer Neg Hx   . Pancreatic cancer Neg Hx   . Rectal cancer Neg Hx   . Stomach cancer Neg Hx    Social History   Tobacco Use  . Smoking status: Current Every Day Smoker    Packs/day: 0.75    Types: Cigarettes  . Smokeless tobacco: Never Used  Substance Use Topics  . Alcohol use: Yes    Alcohol/week: 0.0 oz    Comment: occasional on monthly  basis  . Drug use: No      Review of Systems Per HPI    Objective:   Physical Exam Physical Exam  Constitutional: She is oriented to person, place, and time. She appears well-developed and well-nourished. No distress.  HENT:  Head: Normocephalic and atraumatic.  Cardiovascular: Normal rate, regular rhythm and normal heart sounds.   Pulmonary/Chest: Effort normal and breath sounds normal.  Abdominal: Soft. She exhibits no distension. There is no tenderness. There is no rebound, no guarding and no CVA tenderness.  Neurological: She is alert and oriented to person, place, and time.  Skin: Skin is warm and dry. She is not diaphoretic.  Psychiatric: She has a normal mood and affect. Her behavior is normal. Judgment and thought content normal.  Vitals reviewed.        BP 104/68 (BP Location: Left Arm, Patient Position: Sitting, Cuff Size: Large)   Pulse 77   Temp 98.3 F (36.8 C) (Oral)   Ht 5\' 5"  (1.651 m)   Wt 227 lb 4 oz (103.1 kg)   SpO2 95%   BMI 37.82 kg/m  Results  for orders placed or performed in visit on 06/22/17  POCT Urinalysis Dipstick (Automated)  Result Value Ref Range   Color, UA yellow    Clarity, UA clear    Glucose, UA Negative Negative   Bilirubin, UA neg    Ketones, UA neg    Spec Grav, UA 1.025 1.010 - 1.025   Blood, UA 2+    pH, UA 6.0 5.0 - 8.0   Protein, UA Negative Negative   Urobilinogen, UA 0.2 0.2 or 1.0 E.U./dL   Nitrite, UA neg    Leukocytes, UA Negative Negative    Assessment & Plan:  1. Acute left flank pain - POCT Urinalysis Dipstick (Automated) - suspect kidney stone that she has passed with improvement of symptoms today - discussed imaging and she prefers to wait and let us know if symptoms return - provided information about nephrolithiasis and encouraged good hydration, avoid tea  2. Hematuria, unspecified type - she has an appointment for labs next month, will check urine to make sure hematuria has resolved  - Urine  Microscopic; Future   Clarene Reamer, FNP-BC  [facility], [facility]  06/22/2017 9:29 AM

## 2017-06-22 NOTE — Patient Instructions (Signed)
Good to see you today Please let me or Dr. Glori Bickers know if pain returns and we can get a scan Let's check urine when you are getting labs done next week  Kidney Stones Kidney stones (urolithiasis) are solid, rock-like deposits that form inside of the organs that make urine (kidneys). A kidney stone may form in a kidney and move into the bladder, where it can cause intense pain and block the flow of urine. Kidney stones are created when high levels of certain minerals are found in the urine. They are usually passed through urination, but in some cases, medical treatment may be needed to remove them. What are the causes? Kidney stones may be caused by:  A condition in which certain glands produce too much parathyroid hormone (primary hyperparathyroidism), which causes too much calcium buildup in the blood.  Buildup of uric acid crystals in the bladder (hyperuricosuria). Uric acid is a chemical that the body produces when you eat certain foods. It usually exits the body in the urine.  Narrowing (stricture) of one or both of the tubes that drain urine from the kidneys to the bladder (ureters).  A kidney blockage that is present at birth (congenital obstruction).  Past surgery on the kidney or the ureters, such as gastric bypass surgery.  What increases the risk? The following factors make you more likely to develop kidney stones:  Having had a kidney stone in the past.  Having a family history of kidney stones.  Not drinking enough water.  Eating a diet that is high in protein, salt (sodium), or sugar.  Being overweight or obese.  What are the signs or symptoms? Symptoms of a kidney stone may include:  Nausea.  Vomiting.  Blood in the urine (hematuria).  Pain in the side of the abdomen, right below the ribs (flank pain). Pain usually spreads (radiates) to the groin.  Needing to urinate frequently or urgently.  How is this diagnosed? This condition may be diagnosed based  on:  Your medical history.  A physical exam.  Blood tests.  Urine tests.  CT scan.  Abdominal X-ray.  A procedure to examine the inside of the bladder (cystoscopy).  How is this treated? Treatment for kidney stones depends on the size, location, and makeup of the stones. Treatment may involve:  Analyzing your urine before and after you pass the stone through urination.  Being monitored at the hospital until you pass the stone through urination.  Increasing your fluid intake and decreasing the amount of calcium and protein in your diet.  A procedure to break up kidney stones in the bladder using: ? A focused beam of light (laser therapy). ? Shock waves (extracorporeal shock wave lithotripsy).  Surgery to remove kidney stones. This may be needed if you have severe pain or have stones that block your urinary tract.  Follow these instructions at home: Eating and drinking   Drink enough fluid to keep your urine clear or pale yellow. This will help you to pass the kidney stone.  If directed, change your diet. This may include: ? Limiting how much sodium you eat. ? Eating more fruits and vegetables. ? Limiting how much meat, poultry, fish, and eggs you eat.  Follow instructions from your health care provider about eating or drinking restrictions. General instructions  Collect urine samples as told by your health care provider. You may need to collect a urine sample: ? 24 hours after you pass the stone. ? 8-12 weeks after passing the kidney stone,  and every 6-12 months after that.  Strain your urine every time you urinate, for as long as directed. Use the strainer that your health care provider recommends.  Do not throw out the kidney stone after passing it. Keep the stone so it can be tested by your health care provider. Testing the makeup of your kidney stone may help prevent you from getting kidney stones in the future.  Take over-the-counter and prescription medicines  only as told by your health care provider.  Keep all follow-up visits as told by your health care provider. This is important. You may need follow-up X-rays or ultrasounds to make sure that your stone has passed. How is this prevented? To prevent another kidney stone:  Drink enough fluid to keep your urine clear or pale yellow. This is the best way to prevent kidney stones.  Eat a healthy diet and follow recommendations from your health care provider about foods to avoid. You may be instructed to eat a low-protein diet. Recommendations vary depending on the type of kidney stone that you have.  Maintain a healthy weight.  Contact a health care provider if:  You have pain that gets worse or does not get better with medicine. Get help right away if:  You have a fever or chills.  You develop severe pain.  You develop new abdominal pain.  You faint.  You are unable to urinate. This information is not intended to replace advice given to you by your health care provider. Make sure you discuss any questions you have with your health care provider. Document Released: 12/28/2004 Document Revised: 07/18/2015 Document Reviewed: 06/13/2015 Elsevier Interactive Patient Education  [employer].

## 2017-07-19 ENCOUNTER — Other Ambulatory Visit: Payer: 59

## 2017-07-19 ENCOUNTER — Other Ambulatory Visit (INDEPENDENT_AMBULATORY_CARE_PROVIDER_SITE_OTHER): Payer: 59

## 2017-07-19 DIAGNOSIS — R739 Hyperglycemia, unspecified: Secondary | ICD-10-CM | POA: Diagnosis not present

## 2017-07-19 DIAGNOSIS — E039 Hypothyroidism, unspecified: Secondary | ICD-10-CM | POA: Diagnosis not present

## 2017-07-19 DIAGNOSIS — E785 Hyperlipidemia, unspecified: Secondary | ICD-10-CM | POA: Diagnosis not present

## 2017-07-19 DIAGNOSIS — R319 Hematuria, unspecified: Secondary | ICD-10-CM | POA: Diagnosis not present

## 2017-07-19 DIAGNOSIS — Z0000 Encounter for general adult medical examination without abnormal findings: Secondary | ICD-10-CM

## 2017-07-19 LAB — COMPREHENSIVE METABOLIC PANEL
ALBUMIN: 3.8 g/dL (ref 3.5–5.2)
ALK PHOS: 63 U/L (ref 39–117)
ALT: 20 U/L (ref 0–35)
AST: 18 U/L (ref 0–37)
BUN: 15 mg/dL (ref 6–23)
CALCIUM: 8.6 mg/dL (ref 8.4–10.5)
CO2: 30 mEq/L (ref 19–32)
CREATININE: 0.87 mg/dL (ref 0.40–1.20)
Chloride: 107 mEq/L (ref 96–112)
GFR: 71.37 mL/min (ref >60.00)
Glucose, Bld: 129 mg/dL — ABNORMAL HIGH (ref 70–99)
Potassium: 4.1 mEq/L (ref 3.5–5.1)
SODIUM: 142 meq/L (ref 135–145)
TOTAL PROTEIN: 6.4 g/dL (ref 6.0–8.3)
Total Bilirubin: 0.4 mg/dL (ref 0.2–1.2)

## 2017-07-19 LAB — CBC WITH DIFFERENTIAL/PLATELET
BASOS PCT: 0.9 % (ref 0.0–3.0)
Basophils Absolute: 0 10*3/uL (ref 0.0–0.1)
EOS ABS: 0.2 10*3/uL (ref 0.0–0.7)
Eosinophils Relative: 3.5 % (ref 0.0–5.0)
HCT: 36.7 % (ref 36.0–46.0)
Hemoglobin: 12.6 g/dL (ref 12.0–15.0)
Lymphocytes Relative: 37.4 % (ref 12.0–46.0)
Lymphs Abs: 2 10*3/uL (ref 0.7–4.0)
MCHC: 34.2 g/dL (ref 30.0–36.0)
MCV: 86.2 fl (ref 78.0–100.0)
MONO ABS: 0.3 10*3/uL (ref 0.1–1.0)
Monocytes Relative: 5.6 % (ref 3.0–12.0)
NEUTROS ABS: 2.8 10*3/uL (ref 1.4–7.7)
Neutrophils Relative %: 52.6 % (ref 43.0–77.0)
Platelets: 201 10*3/uL (ref 150.0–400.0)
RBC: 4.26 Mil/uL (ref 3.87–5.11)
RDW: 14.5 % (ref 11.5–15.5)
WBC: 5.3 10*3/uL (ref 4.0–10.5)

## 2017-07-19 LAB — LIPID PANEL
CHOL/HDL RATIO: 3
CHOLESTEROL: 173 mg/dL (ref 0–200)
HDL: 52.3 mg/dL (ref >39.00)
LDL CALC: 99 mg/dL (ref 0–99)
NonHDL: 120.42
TRIGLYCERIDES: 107 mg/dL (ref 0.0–149.0)
VLDL: 21.4 mg/dL (ref 0.0–40.0)

## 2017-07-19 LAB — URINALYSIS, MICROSCOPIC ONLY

## 2017-07-19 LAB — HEMOGLOBIN A1C: Hgb A1c MFr Bld: 6.4 % (ref 4.6–6.5)

## 2017-07-19 LAB — TSH: TSH: 2.47 u[IU]/mL (ref 0.35–4.50)

## 2017-07-20 NOTE — Addendum Note (Signed)
Addended by: Clarene Reamer B on: 07/20/2017 05:32 PM   Modules accepted: Orders

## 2017-07-21 ENCOUNTER — Other Ambulatory Visit: Payer: 59

## 2017-08-10 NOTE — Progress Notes (Signed)
08/11/2017 2:23 PM   Evelyn Shepherd [id number] [id number]  Referring provider: Abner Greenspan, MD [address] [place], [place] [address]  Chief Complaint  Patient presents with  . Hematuria    HPI: Patient is a 57 -year-old Caucasian female who presents today as a referral from Dr. Loura Pardon for microscopic hematuria.   Patient was found to have microscopic hematuria on 07/19/2017 with 3-6 RBC's/hpf.     She had one urinary stone 6 years ago.  She spontaneously passed.  She had an episode of left flank pain that radiated to her left side that has been occurring intermittently since May.    She does not have a prior history of recurrent urinary tract infections, trauma to the genitourinary tract or malignancies of the genitourinary tract.   Her mother had kidney cancer.  Her son has stones.    Today, she is having frequency, urgency, nocturia, incontinence, hesitancy, intermittency.  Patient denies any gross hematuria, dysuria or suprapubic/flank pain.  Patient denies any fevers, chills, nausea or vomiting.  Her UA today demonstrates 11-30 RBC's and many bacteria.    She is a smoker.   She has not worked with Sports administrator, trichloroethylene, etc.    She has a high BMI.   She denies any vaginal discharge.    PMH: Past Medical History:  Diagnosis Date  . Diabetes mellitus ([place])    Type II, 3/07  . History of depression   . History of gastroesophageal reflux (GERD)   . History of hyperlipidemia   . History of migraine   . History of obesity    lap band surgery  . History of pneumonia 2005    Surgical History: Past Surgical History:  Procedure Laterality Date  . AUGMENTATION MAMMAPLASTY Bilateral 1991  . BREAST ENHANCEMENT SURGERY    . CESAREAN SECTION    . DENTAL SURGERY    . LAPAROSCOPIC GASTRIC BANDING  07/31/07  . LUMBAR DISC SURGERY    . TONGUE SURGERY  03/2005   lesion removal    Home Medications:  Allergies as of 08/11/2017      Reactions     Meloxicam    REACTION: unspecified   Sulfonamide Derivatives    REACTION: unspecified      Medication List        Accurate as of 08/11/17  2:23 PM. Always use your most recent med list.          atorvastatin 10 MG tablet Commonly known as:  LIPITOR Take 1 tablet (10 mg total) by mouth daily.   escitalopram 20 MG tablet Commonly known as:  LEXAPRO Take 1 tablet (20 mg total) by mouth daily.   fluticasone 50 MCG/ACT nasal spray Commonly known as:  FLONASE PLACE 1 SPRAY INTO BOTH NOSTRILS 2 (TWO) TIMES DAILY.   ketoconazole 2 % cream Commonly known as:  NIZORAL APPLY TO AREAS OF FACE THAT ARE AFFECTED ONCE DAILY   metaxalone 800 MG tablet Commonly known as:  SKELAXIN TAKE 1 TABLET BY MOUTH 3 TIMES DAILY AS NEEDED FOR BACK PAIN   SUMAtriptan 100 MG tablet Commonly known as:  IMITREX TAKE 1 TABLET BY MOUTH DAILY AS NEEDED FOR MIGRAINE.   varenicline 0.5 MG X 11 & 1 MG X 42 tablet Commonly known as:  CHANTIX PAK Take one 0.5 mg tablet by mouth once daily for 3 days, then increase to one 0.5 mg tablet twice daily for 4 days, then increase to one 1 mg tablet twice  daily.   varenicline 1 MG tablet Commonly known as:  CHANTIX Take 1 tablet (1 mg total) by mouth 2 (two) times daily.       Allergies:  Allergies  Allergen Reactions  . Meloxicam     REACTION: unspecified  . Sulfonamide Derivatives     REACTION: unspecified    Family History: Family History  Problem Relation Age of Onset  . Pneumonia Father        died  . Lung cancer Father   . Kidney cancer Mother   . Diabetes Mother   . Depression Brother        commited suicide  . Alcohol abuse Brother   . Early menopause Other        family  . Breast cancer Paternal Aunt   . Breast cancer Paternal Aunt   . Colon cancer Neg Hx   . Pancreatic cancer Neg Hx   . Rectal cancer Neg Hx   . Stomach cancer Neg Hx     Social History:  reports that she has been smoking cigarettes.  She has been smoking about  0.75 packs per day. She has never used smokeless tobacco. She reports that she drinks alcohol. She reports that she does not use drugs.  ROS: UROLOGY Frequent Urination?: Yes Hard to postpone urination?: Yes Burning/pain with urination?: No Get up at night to urinate?: Yes Leakage of urine?: Yes Urine stream starts and stops?: Yes Trouble starting stream?: No Do you have to strain to urinate?: No Blood in urine?: Yes Urinary tract infection?: No Sexually transmitted disease?: No Injury to kidneys or bladder?: No Painful intercourse?: No Weak stream?: No Currently pregnant?: No Vaginal bleeding?: No Last menstrual period?: Postmenopausal  Gastrointestinal Nausea?: Yes Vomiting?: Yes Indigestion/heartburn?: No Diarrhea?: No Constipation?: No  Constitutional Fever: No Night sweats?: No Weight loss?: No Fatigue?: Yes  Skin Skin rash/lesions?: No Itching?: No  Eyes Blurred vision?: No Double vision?: No  Ears/Nose/Throat Sore throat?: No Sinus problems?: No  Hematologic/Lymphatic Swollen glands?: No Easy bruising?: No  Cardiovascular Leg swelling?: No Chest pain?: No  Respiratory Cough?: No Shortness of breath?: No  Endocrine Excessive thirst?: No  Musculoskeletal Back pain?: Yes Joint pain?: Yes  Neurological Headaches?: No Dizziness?: No  Psychologic Depression?: No Anxiety?: No  Physical Exam: BP 99/66 (BP Location: Left Arm, Patient Position: Sitting, Cuff Size: Large)   Pulse 86   Ht 5\' 6"  (1.676 m)   Wt 222 lb 9.6 oz (101 kg)   BMI 35.93 kg/m   Constitutional:  Well nourished. Alert and oriented, No acute distress. HEENT: [place] AT, moist mucus membranes.  Trachea midline, no masses. Cardiovascular: No clubbing, cyanosis, or edema. Respiratory: Normal respiratory effort, no increased work of breathing. GI: Abdomen is soft, non tender, non distended, no abdominal masses. Liver and spleen not palpable.  No hernias appreciated.  Stool  sample for occult testing is not indicated.   GU: No CVA tenderness.  No bladder fullness or masses.   Skin: No rashes, bruises or suspicious lesions. Lymph: No cervical or inguinal adenopathy. Neurologic: Grossly intact, no focal deficits, moving all 4 extremities. Psychiatric: Normal mood and affect.  Laboratory Data: Lab Results  Component Value Date   WBC 5.3 07/19/2017   HGB 12.6 07/19/2017   HCT 36.7 07/19/2017   MCV 86.2 07/19/2017   PLT 201.0 07/19/2017    Lab Results  Component Value Date   CREATININE 0.87 07/19/2017    No results found for: PSA  No results found for:  TESTOSTERONE  Lab Results  Component Value Date   HGBA1C 6.4 07/19/2017    Lab Results  Component Value Date   TSH 2.47 07/19/2017       Component Value Date/Time   CHOL 173 07/19/2017 0927   HDL 52.30 07/19/2017 0927   CHOLHDL 3 07/19/2017 0927   VLDL 21.4 07/19/2017 0927   LDLCALC 99 07/19/2017 0927    Lab Results  Component Value Date   AST 18 07/19/2017   Lab Results  Component Value Date   ALT 20 07/19/2017   No components found for: ALKALINEPHOPHATASE No components found for: BILIRUBINTOTAL  No results found for: ESTRADIOL   Urinalysis 11-30 RBC's.  Many bacteria.  See Epic.     I have reviewed the labs   Assessment & Plan:    1. Microscopic hematuria Explained to the patient that there are a number of causes that can be associated with blood in the urine, such as stones, UTI's, damage to the urinary tract and/or cancer. At this time, I felt that the patient warranted further urologic evaluation.   The AUA guidelines state that a CT urogram is the preferred imaging study to evaluate hematuria. I explained to the patient that a contrast material will be injected into a vein and that in rare instances, an allergic reaction can result and may even life threatening   The patient denies any allergies to contrast, iodine and/or seafood and is not taking  metformin. Following the imaging study,  I've recommended a cystoscopy. I described how this is performed, typically in an office setting with a flexible cystoscope. We described the risks, benefits, and possible side effects, the most common of which is a minor amount of blood in the urine and/or burning which usually resolves in 24 to 48 hours.   The patient had the opportunity to ask questions which were answered. Based upon this discussion, the patient is willing to proceed. Therefore, I've ordered: a CT Urogram and cystoscopy.  The patient will return following all of the above for discussion of the results.  UA  Urine culture  BUN + creatinine    Return for CT Urogram report and cystoscopy.  These notes generated with voice recognition software. I apologize for typographical errors.  Zara Council, PA-C  [facility] [address] [place]  [place], [place] [address] [phone]

## 2017-08-11 ENCOUNTER — Encounter: Payer: Self-pay | Admitting: Urology

## 2017-08-11 ENCOUNTER — Ambulatory Visit: Payer: 59 | Admitting: Urology

## 2017-08-11 VITALS — BP 99/66 | HR 86 | Ht 66.0 in | Wt 222.6 lb

## 2017-08-11 DIAGNOSIS — R3129 Other microscopic hematuria: Secondary | ICD-10-CM | POA: Diagnosis not present

## 2017-08-11 LAB — URINALYSIS, COMPLETE
BILIRUBIN UA: NEGATIVE
Glucose, UA: NEGATIVE
KETONES UA: NEGATIVE
Leukocytes, UA: NEGATIVE
Nitrite, UA: NEGATIVE
PH UA: 6.5 (ref 5.0–7.5)
Protein, UA: NEGATIVE
Specific Gravity, UA: >1.03 — ABNORMAL HIGH (ref 1.005–1.030)
UUROB: 0.2 mg/dL (ref 0.2–1.0)

## 2017-08-11 LAB — MICROSCOPIC EXAMINATION

## 2017-08-11 NOTE — Patient Instructions (Signed)
Hematuria, Adult Hematuria is blood in your urine. It can be caused by a bladder infection, kidney infection, prostate infection, kidney stone, or cancer of your urinary tract. Infections can usually be treated with medicine, and a kidney stone usually will pass through your urine. If neither of these is the cause of your hematuria, further workup to find out the reason may be needed. It is very important that you tell your health care provider about any blood you see in your urine, even if the blood stops without treatment or happens without causing pain. Blood in your urine that happens and then stops and then happens again can be a symptom of a very serious condition. Also, pain is not a symptom in the initial stages of many urinary cancers. Follow these instructions at home:  Drink lots of fluid, 3-4 quarts a day. If you have been diagnosed with an infection, cranberry juice is especially recommended, in addition to large amounts of water.  Avoid caffeine, tea, and carbonated beverages because they tend to irritate the bladder.  Avoid alcohol because it may irritate the prostate.  Take all medicines as directed by your health care provider.  If you were prescribed an antibiotic medicine, finish it all even if you start to feel better.  If you have been diagnosed with a kidney stone, follow your health care provider's instructions regarding straining your urine to catch the stone.  Empty your bladder often. Avoid holding urine for long periods of time.  After a bowel movement, women should cleanse front to back. Use each tissue only once.  Empty your bladder before and after sexual intercourse if you are a female. Contact a health care provider if:  You develop back pain.  You have a fever.  You have a feeling of sickness in your stomach (nausea) or vomiting.  Your symptoms are not better in 3 days. Return sooner if you are getting worse. Get help right away if:  You develop  severe vomiting and are unable to keep the medicine down.  You develop severe back or abdominal pain despite taking your medicines.  You begin passing a large amount of blood or clots in your urine.  You feel extremely weak or faint, or you pass out. This information is not intended to replace advice given to you by your health care provider. Make sure you discuss any questions you have with your health care provider. Document Released: 12/28/2004 Document Revised: 06/05/2015 Document Reviewed: 08/28/2012 Elsevier Interactive Patient Education  2017 Elsevier Inc.  CT Scan A CT scan (computed tomography scan) is an imaging scan. It uses X-rays and a computer to make detailed pictures of different areas inside the body. A CT scan can give more information than a regular X-ray exam. A CT scan provides data about internal organs, soft tissue structures, blood vessels, and bones. In this procedure, the pictures will be taken in a large machine that has an opening (CT scanner). Tell a health care provider about:  Any allergies you have.  All medicines you are taking, including vitamins, herbs, eye drops, creams, and over-the-counter medicines.  Any blood disorders you have.  Any surgeries you have had.  Any medical conditions you have.  Whether you are pregnant or may be pregnant. What are the risks? Generally, this is a safe procedure. However, problems may occur, including:  An allergic reaction to dyes.  Development of cancer from excessive exposure to radiation from multiple CT scans. This is rare.  What happens before   the procedure? Staying hydrated Follow instructions from your health care provider about hydration, which may include:  Up to 2 hours before the procedure - you may continue to drink clear liquids, such as water, clear fruit juice, black coffee, and plain tea.  Eating and drinking restrictions Follow instructions from your health care provider about eating and  drinking, which may include:  24 hours before the procedure - stop drinking caffeinated beverages, such as energy drinks, tea, soda, coffee, and hot chocolate.  8 hours before the procedure - stop eating heavy meals or foods such as meat, fried foods, or fatty foods.  6 hours before the procedure - stop eating light meals or foods, such as toast or cereal.  6 hours before the procedure - stop drinking milk or drinks that contain milk.  2 hours before the procedure - stop drinking clear liquids.  General instructions  Remove any jewelry.  Ask your health care provider about changing or stopping your regular medicines. This is especially important if you are taking diabetes medicines or blood thinners. What happens during the procedure?  You will lie on a table with your arms above your head.  An IV tube may be inserted into one of your veins.  The contrast dye may be injected into the IV tube. You may feel warm or have a metallic taste in your mouth.  The table you will be lying on will move into the CT scanner.  You will be able to see, hear, and talk to the person running the machine while you are in it. Follow that person's instructions.  The CT scanner will move around you to take pictures. Do not move while it is scanning. Staying still helps the scanner to get a good image.  When the best possible pictures have been taken, the machine will be turned off. The table will be moved out of the machine.  The IV tube will be removed. The procedure may vary among health care providers and hospitals. What happens after the procedure?  It is up to you to get the results of your procedure. Ask your health care provider, or the department that is doing the procedure, when your results will be ready. Summary  A CT scan is an imaging scan.  A CT scan uses X-rays and a computer to make detailed pictures of different areas of your body.  Follow instructions from your health care  provider about eating and drinking before the procedure.  You will be able to see, hear, and talk to the person running the machine while you are in it. Follow that person's instructions. This information is not intended to replace advice given to you by your health care provider. Make sure you discuss any questions you have with your health care provider. Document Released: 02/05/2004 Document Revised: 01/31/2016 Document Reviewed: 01/31/2016 Elsevier Interactive Patient Education  2018 Else[employer] Inc.  Cystoscopy Cystoscopy is a procedure that is used to help diagnose and sometimes treat conditions that affect that lower urinary tract. The lower urinary tract includes the bladder and the tube that drains urine from the bladder out of the body (urethra). Cystoscopy is performed with a thin, tube-shaped instrument with a light and camera at the end (cystoscope). The cystoscope may be hard (rigid) or flexible, depending on the goal of the procedure.The cystoscope is inserted through the urethra, into the bladder. Cystoscopy may be recommended if you have:  Urinary tractinfections that keep coming back (recurring).  Blood in the urine (hematuria).    Loss of bladder control (urinary incontinence) or an overactive bladder.  Unusual cells found in a urine sample.  A blockage in the urethra.  Painful urination.  An abnormality in the bladder found during an intravenous pyelogram (IVP) or CT scan.  Cystoscopy may also be done to remove a sample of tissue to be examined under a microscope (biopsy). Tell a health care provider about:  Any allergies you have.  All medicines you are taking, including vitamins, herbs, eye drops, creams, and over-the-counter medicines.  Any problems you or family members have had with anesthetic medicines.  Any blood disorders you have.  Any surgeries you have had.  Any medical conditions you have.  Whether you are pregnant or may be pregnant. What are the  risks? Generally, this is a safe procedure. However, problems may occur, including:  Infection.  Bleeding.  Allergic reactions to medicines.  Damage to other structures or organs.  What happens before the procedure?  Ask your health care provider about: ? Changing or stopping your regular medicines. This is especially important if you are taking diabetes medicines or blood thinners. ? Taking medicines such as aspirin and ibuprofen. These medicines can thin your blood. Do not take these medicines before your procedure if your health care provider instructs you not to.  Follow instructions from your health care provider about eating or drinking restrictions.  You may be given antibiotic medicine to help prevent infection.  You may have an exam or testing, such as X-rays of the bladder, urethra, or kidneys.  You may have urine tests to check for signs of infection.  Plan to have someone take you home after the procedure. What happens during the procedure?  To reduce your risk of infection,your health care team will wash or sanitize their hands.  You will be given one or more of the following: ? A medicine to help you relax (sedative). ? A medicine to numb the area (local anesthetic).  The area around the opening of your urethra will be cleaned.  The cystoscope will be passed through your urethra into your bladder.  Germ-free (sterile)fluid will flow through the cystoscope to fill your bladder. The fluid will stretch your bladder so that your surgeon can clearly examine your bladder walls.  The cystoscope will be removed and your bladder will be emptied. The procedure may vary among health care providers and hospitals. What happens after the procedure?  You may have some soreness or pain in your abdomen and urethra. Medicines will be available to help you.  You may have some blood in your urine.  Do not drive for 24 hours if you received a sedative. This information is  not intended to replace advice given to you by your health care provider. Make sure you discuss any questions you have with your health care provider. Document Released: 12/26/1999 Document Revised: 05/08/2015 Document Reviewed: 11/14/2014 Elsevier Interactive Patient Education  2018 Elsevier Inc.  

## 2017-08-12 LAB — BUN+CREAT
BUN / CREAT RATIO: 13 (ref 9–23)
BUN: 12 mg/dL (ref 6–24)
CREATININE: 0.89 mg/dL (ref 0.57–1.00)
GFR, EST AFRICAN AMERICAN: 84 mL/min/{1.73_m2} (ref >59)
GFR, EST NON AFRICAN AMERICAN: 73 mL/min/{1.73_m2} (ref >59)

## 2017-08-15 ENCOUNTER — Telehealth: Payer: Self-pay

## 2017-08-15 LAB — CULTURE, URINE COMPREHENSIVE

## 2017-08-15 MED ORDER — AMOXICILLIN-POT CLAVULANATE 875-125 MG PO TABS: 1.0000 | ORAL_TABLET | Freq: Two times a day (BID) | ORAL | 0 refills | Status: AC

## 2017-08-15 NOTE — Telephone Encounter (Signed)
-----   Message from Nori Riis, PA-C sent at 08/15/2017 12:14 PM EDT ----- Please let Evelyn Shepherd know that she has a bladder infection.  She needs to start Augmentin 875/125, twice daily for seven days.

## 2017-08-15 NOTE — Telephone Encounter (Signed)
Pt informed, rx sent to pharmacy. Do you know where we stand on the CT scan?

## 2017-09-14 ENCOUNTER — Ambulatory Visit
Admission: RE | Admit: 2017-09-14 | Discharge: 2017-09-14 | Disposition: A | Discharge status: Home / Self Care | Payer: 59 | Source: Ambulatory Visit | Attending: Urology | Admitting: Urology

## 2017-09-14 DIAGNOSIS — I700 Atherosclerosis of aorta: Secondary | ICD-10-CM | POA: Insufficient documentation

## 2017-09-14 DIAGNOSIS — R3129 Other microscopic hematuria: Secondary | ICD-10-CM | POA: Insufficient documentation

## 2017-09-14 DIAGNOSIS — N281 Cyst of kidney, acquired: Secondary | ICD-10-CM | POA: Insufficient documentation

## 2017-09-14 MED ORDER — IOPAMIDOL (ISOVUE-300) INJECTION 61%
100.0000 mL | Freq: Once | INTRAVENOUS | Status: AC | PRN
Start: 2017-09-14 — End: 2017-09-14
  Administered 2017-09-14: 125 mL via INTRAVENOUS

## 2017-09-20 ENCOUNTER — Ambulatory Visit: Payer: 59 | Admitting: Urology

## 2017-09-20 ENCOUNTER — Encounter: Payer: Self-pay | Admitting: Urology

## 2017-09-20 VITALS — BP 94/62 | HR 75 | Ht 66.0 in | Wt 220.0 lb

## 2017-09-20 DIAGNOSIS — R3129 Other microscopic hematuria: Secondary | ICD-10-CM

## 2017-09-20 NOTE — Progress Notes (Signed)
   09/20/17  CC:  Chief Complaint  Patient presents with  . Cysto    HPI: 57 year old female (current smoker) who presents today for cystoscopy to complete her microscopic hematuria work-up.  She underwent CT urogram on 09/14/2017 which showed no renal pathology.  Incidental right renal cyst was identified, otherwise unremarkable study.  The right distal ureter is not completely opacified due to timing of bolus contrast.  No concerning dilation.  Blood pressure 94/62, pulse 75, height 5\' 6"  (1.676 m), weight 220 lb (99.8 kg). NED. A&Ox3.   No respiratory distress   Abd soft, NT, ND Normal external genitalia with patent urethral meatus  Cystoscopy Procedure Note  Patient identification was confirmed, informed consent was obtained, and patient was prepped using Betadine solution.  Lidocaine jelly was administered per urethral meatus.    Preoperative abx where received prior to procedure.    Procedure: - Flexible cystoscope introduced, without any difficulty.   - Thorough search of the bladder revealed:    normal urethral meatus    normal urothelium    no stones    no ulcers     no tumors    no urethral polyps    no trabeculation  - Ureteral orifices were normal in position and appearance.  Post-Procedure: - Patient tolerated the procedure well  Assessment/ Plan:  1. Microscopic hematuria No evidence of disease today on cystoscopy CT urogram was also negative Given that she is a current smoker and a risk factor for bladder cancer would recommend repeat cystoscopy in 2 years if she has persistent microscopic blood in her urine Follow-up with primary care, refer back in 2 years if microscopic blood persist Patient is agreeable this plan - Urinalysis, Complete  Return for 2 years for cysto in microsocpic hematuria persists.   Hollice Espy, MD

## 2017-09-21 LAB — MICROSCOPIC EXAMINATION: WBC, UA: NONE SEEN /hpf (ref 0–5)

## 2017-09-21 LAB — URINALYSIS, COMPLETE
Bilirubin, UA: NEGATIVE
Glucose, UA: NEGATIVE
KETONES UA: NEGATIVE
Leukocytes, UA: NEGATIVE
Nitrite, UA: NEGATIVE
PROTEIN UA: NEGATIVE
SPEC GRAV UA: 1.025 (ref 1.005–1.030)
Urobilinogen, Ur: 1 mg/dL (ref 0.2–1.0)
pH, UA: 7 (ref 5.0–7.5)

## 2017-10-17 ENCOUNTER — Ambulatory Visit (INDEPENDENT_AMBULATORY_CARE_PROVIDER_SITE_OTHER): Payer: 59 | Admitting: Family Medicine

## 2017-10-17 ENCOUNTER — Encounter: Payer: Self-pay | Admitting: Family Medicine

## 2017-10-17 ENCOUNTER — Ambulatory Visit (INDEPENDENT_AMBULATORY_CARE_PROVIDER_SITE_OTHER)
Admission: RE | Admit: 2017-10-17 | Discharge: 2017-10-17 | Disposition: A | Discharge status: Home / Self Care | Payer: 59 | Source: Ambulatory Visit | Attending: Family Medicine | Admitting: Family Medicine

## 2017-10-17 VITALS — BP 128/78 | HR 68 | Temp 98.5°F | Ht 66.0 in | Wt 227.0 lb

## 2017-10-17 DIAGNOSIS — M25571 Pain in right ankle and joints of right foot: Secondary | ICD-10-CM | POA: Insufficient documentation

## 2017-10-17 DIAGNOSIS — M7989 Other specified soft tissue disorders: Secondary | ICD-10-CM | POA: Diagnosis not present

## 2017-10-17 DIAGNOSIS — Z23 Encounter for immunization: Secondary | ICD-10-CM

## 2017-10-17 DIAGNOSIS — S99911A Unspecified injury of right ankle, initial encounter: Secondary | ICD-10-CM | POA: Diagnosis not present

## 2017-10-17 NOTE — Progress Notes (Signed)
Subjective:    Patient ID: Evelyn Shepherd, female    DOB: 01/15/1960, 57 y.o.   MRN: [id number]  HPI Here for foot pain /ankle swelling and knee pain and a fall   Fell last Saturday- was at a yard sale - tripped and fell in road  (cracked pavement)  Her R ankle has always been weaker than the L (since injury years ago)  [place] all the way to the ground  [place] to the L (on L knee) after turning R ankle   Swelling is not going down in R ankle/foot  (it improved but not gone)  She did not see any bruising  Pain comes all the way up to the knee  On outside of ankle (inversion injury)   Elbow is tender (L) - not swollen  Scraped L knee   No brace  Elevating  Ice   Also flu shot today    Wt Readings from Last 3 Encounters:  10/17/17 227 lb (103 kg)  09/20/17 220 lb (99.8 kg)  08/11/17 222 lb 9.6 oz (101 kg)   36.64 kg/m   Patient Active Problem List   Diagnosis Date Noted  . Right ankle pain 10/17/2017  . History of shingles 01/22/2016  . Bursitis of left hip 01/29/2015  . Low back pain 12/26/2013  . Brachioradial pruritus 11/07/2013  . Fatigue 11/07/2013  . Encounter for routine gynecological examination 04/11/2013  . Plantar fasciitis 04/11/2013  . Colon cancer screening 02/13/2013  . Screening for breast cancer 04/13/2011  . Special screening for malignant neoplasms, colon 04/13/2011  . Seborrheic dermatitis 04/13/2011  . Routine general medical examination at a health care facility 03/25/2011  . AMENORRHEA 09/18/2008  . Hypothyroidism 08/11/2007  . LOW BACK PAIN, CHRONIC 07/14/2006  . Hyperglycemia 07/06/2006  . Hyperlipidemia 07/06/2006  . DEPRESSION 07/06/2006  . COMMON MIGRAINE 07/06/2006  . GERD 07/06/2006   Past Medical History:  Diagnosis Date  . History of depression   . History of gastroesophageal reflux (GERD)   . History of hyperlipidemia   . History of migraine   . History of obesity    lap band surgery  . History of pneumonia 2005   Past  Surgical History:  Procedure Laterality Date  . AUGMENTATION MAMMAPLASTY Bilateral 1991  . BREAST ENHANCEMENT SURGERY    . CESAREAN SECTION    . DENTAL SURGERY    . LAPAROSCOPIC GASTRIC BANDING  07/31/07  . LUMBAR DISC SURGERY    . TONGUE SURGERY  03/2005   lesion removal   Social History   Tobacco Use  . Smoking status: Former Smoker    Types: Cigarettes    Last attempt to quit: 09/25/2017    Years since quitting: 0.0  . Smokeless tobacco: Never Used  Substance Use Topics  . Alcohol use: Yes    Alcohol/week: 0.0 standard drinks    Comment: occasional on monthly basis  . Drug use: No   Family History  Problem Relation Age of Onset  . Pneumonia Father        died  . Lung cancer Father   . Kidney cancer Mother   . Diabetes Mother   . Depression Brother        commited suicide  . Alcohol abuse Brother   . Early menopause Other        family  . Breast cancer Paternal Aunt   . Breast cancer Paternal Aunt   . Colon cancer Neg Hx   . Pancreatic cancer Neg Hx   .  Rectal cancer Neg Hx   . Stomach cancer Neg Hx    Allergies  Allergen Reactions  . Meloxicam     REACTION: unspecified  . Sulfonamide Derivatives     REACTION: unspecified   Current Outpatient Medications on File Prior to Visit  Medication Sig Dispense Refill  . atorvastatin (LIPITOR) 10 MG tablet Take 1 tablet (10 mg total) by mouth daily. 90 tablet 3  . escitalopram (LEXAPRO) 20 MG tablet Take 1 tablet (20 mg total) by mouth daily. 90 tablet 3  . fluticasone (FLONASE) 50 MCG/ACT nasal spray PLACE 1 SPRAY INTO BOTH NOSTRILS 2 (TWO) TIMES DAILY. (Patient taking differently: Place 1 spray into both nostrils 2 (two) times daily as needed. ) 16 g 2  . ketoconazole (NIZORAL) 2 % cream APPLY TO AREAS OF FACE THAT ARE AFFECTED ONCE DAILY 60 g 2  . metaxalone (SKELAXIN) 800 MG tablet TAKE 1 TABLET BY MOUTH 3 TIMES DAILY AS NEEDED FOR BACK PAIN 30 tablet 1  . SUMAtriptan (IMITREX) 100 MG tablet TAKE 1 TABLET BY MOUTH  DAILY AS NEEDED FOR MIGRAINE. 36 tablet 3   No current facility-administered medications on file prior to visit.     Review of Systems  Constitutional: Negative for activity change, appetite change, fatigue, fever and unexpected weight change.  HENT: Negative for congestion, ear pain, rhinorrhea, sinus pressure and sore throat.   Eyes: Negative for pain, redness and visual disturbance.  Respiratory: Negative for cough, shortness of breath and wheezing.   Cardiovascular: Negative for chest pain and palpitations.  Gastrointestinal: Negative for abdominal pain, blood in stool, constipation and diarrhea.  Endocrine: Negative for polydipsia and polyuria.  Genitourinary: Negative for dysuria, frequency and urgency.  Musculoskeletal: Positive for arthralgias and back pain. Negative for myalgias.  Skin: Negative for pallor and rash.  Allergic/Immunologic: Negative for environmental allergies.  Neurological: Negative for dizziness, syncope, weakness and headaches.  Hematological: Negative for adenopathy. Does not bruise/bleed easily.  Psychiatric/Behavioral: Negative for decreased concentration and dysphoric mood. The patient is not nervous/anxious.        Objective:   Physical Exam  Constitutional: She appears well-developed and well-nourished. No distress.  obese and well appearing   HENT:  Head: Normocephalic and atraumatic.  Eyes: Pupils are equal, round, and reactive to light. Conjunctivae and EOM are normal.  Neck: Normal range of motion. Neck supple.  Cardiovascular: Normal rate, regular rhythm and normal heart sounds.  Pulmonary/Chest: Effort normal and breath sounds normal. No stridor. No respiratory distress. She has no wheezes.  Musculoskeletal: She exhibits edema and tenderness. She exhibits no deformity.       Right ankle: She exhibits swelling. She exhibits normal range of motion, no ecchymosis, no deformity, no laceration and normal pulse. Tenderness. Lateral malleolus, AITFL,  posterior TFL and head of 5th metatarsal tenderness found. No medial malleolus, no CF ligament and no proximal fibula tenderness found. Achilles tendon normal.       Right foot: There is tenderness and bony tenderness. There is normal range of motion, no swelling, normal capillary refill, no crepitus, no deformity and no laceration.  RLE Tender over lateral malleolus (with swelling) -ant and post to it  No ecchymosis  Some tenderness over high dorsal foot and head of 5th MTP Some improvement with compression (when ace applied)  Nl rom foot/ankle with no neuro changes   LUE Mild tenderness over medial epicondyle w/o swelling or crepitus  Nl rom   Lymphadenopathy:    She has no cervical adenopathy.  Neurological: She has normal strength. She displays no atrophy. No sensory deficit. She exhibits normal muscle tone. Gait normal.  Skin: Skin is warm and dry. No rash noted.  Abrasion (healing) L knee   Psychiatric: She has a normal mood and affect.          Assessment & Plan:   Problem List Items Addressed This Visit      Other   Right ankle pain - Primary    With lateral pain /swelling after inversion and fall   Reassuring exam overall-has been bearing wt with no problem  Xray today (also hx of "weak ankle" with remote injury years ago) Ice/elevate Demonstrated ace bandage applic for help with compression Recommend aleve 1-2 bid with food for at least 2-3 d to help with inflammation  Given handout for phase 1 ankle rehab exercises to try once xray report comes in  Update if not starting to improve in a week or if worsening        Relevant Orders   DG Ankle Complete Right   DG Foot Complete Right    Other Visit Diagnoses    Need for influenza vaccination       Relevant Orders   Flu Vaccine QUAD 6+ mos PF IM (Fluarix Quad PF) (Completed)

## 2017-10-17 NOTE — Assessment & Plan Note (Addendum)
With lateral pain /swelling after inversion and fall   Reassuring exam overall-has been bearing wt with no problem  Xray today (also hx of "weak ankle" with remote injury years ago) Ice/elevate Demonstrated ace bandage applic for help with compression Recommend aleve 1-2 bid with food for at least 2-3 d to help with inflammation  Given handout for phase 1 ankle rehab exercises to try once xray report comes in  Update if not starting to improve in a week or if worsening

## 2017-10-17 NOTE — Patient Instructions (Signed)
I do recommend - 1-2 aleve with food twice daily for 2-3 days at least- to help internal swelling and pain  Keep elevating Use ice/cold compress  Use caution with walking  Use ace wrap -figure 8 for some compression - when up and around   We will check xray today and alert you with a result   If not continuing to improve let us know

## 2018-01-26 ENCOUNTER — Other Ambulatory Visit: Payer: Self-pay | Admitting: Family Medicine

## 2018-01-26 NOTE — Telephone Encounter (Signed)
Last OV was an acute ankle/foot problem appt on 10/27/17, nizoral last filled on 08/03/16 #60g with 2 refills and the skelaxin last filled on 03/23/17 #30 tabs with 1 refill, please advise

## 2018-03-14 DIAGNOSIS — H5201 Hypermetropia, right eye: Secondary | ICD-10-CM | POA: Diagnosis not present

## 2018-03-14 DIAGNOSIS — H524 Presbyopia: Secondary | ICD-10-CM | POA: Diagnosis not present

## 2018-03-14 DIAGNOSIS — H5212 Myopia, left eye: Secondary | ICD-10-CM | POA: Diagnosis not present

## 2018-05-11 ENCOUNTER — Other Ambulatory Visit: Payer: Self-pay | Admitting: Family Medicine

## 2018-05-11 NOTE — Telephone Encounter (Signed)
Last OV 10/17/2017   Last refilled  30 tablet 1 01/26/2018     Next OV none scheduled   Sent to PCP for approval

## 2018-05-11 NOTE — Telephone Encounter (Signed)
She is due for PE after 06/09/18   (but has [place] so we cannot do it virtually)  Please set up PE for summer-hopefully we will be open

## 2018-06-15 ENCOUNTER — Other Ambulatory Visit: Payer: Self-pay

## 2018-06-15 MED ORDER — ESCITALOPRAM OXALATE 20 MG PO TABS: 20.0000 mg | ORAL_TABLET | Freq: Every day | ORAL | 1 refills | Status: DC

## 2018-06-15 MED ORDER — SUMATRIPTAN SUCCINATE 100 MG PO TABS: ORAL_TABLET | ORAL | 1 refills | Status: DC

## 2018-06-15 NOTE — Telephone Encounter (Signed)
See prev note. Last OV was on 10/2017 for ankle and leg issues. No future appt

## 2018-06-15 NOTE — Telephone Encounter (Signed)
Pt left v/m; pt went out of town and needs refills of imitrex and lexapro sent to [facility]. Pt request cb.

## 2018-07-04 ENCOUNTER — Other Ambulatory Visit: Payer: Self-pay | Admitting: Family Medicine

## 2018-07-04 NOTE — Telephone Encounter (Signed)
Please phone these in  The directions are too long to send electronically Thanks

## 2018-07-04 NOTE — Telephone Encounter (Signed)
Pt called to request a prescription for Chantix. Please send to [facility] pharmacy. Pt knows Dr Glori Bickers will be in 6/29.

## 2018-07-04 NOTE — Addendum Note (Signed)
Addended by: Loura Pardon A on: 07/04/2018 07:05 PM   Modules accepted: Orders

## 2018-07-07 MED ORDER — VARENICLINE TARTRATE 0.5 MG X 11 & 1 MG X 42 PO MISC: ORAL | 0 refills | Status: DC

## 2018-07-07 MED ORDER — VARENICLINE TARTRATE 1 MG PO TABS: 1.0000 mg | ORAL_TABLET | Freq: Two times a day (BID) | ORAL | 3 refills | Status: DC

## 2018-07-07 NOTE — Addendum Note (Signed)
Addended by: [name] on: 07/07/2018 09:00 AM   Modules accepted: Orders

## 2018-07-07 NOTE — Telephone Encounter (Signed)
Rxs faxed to [facility]

## 2018-07-17 ENCOUNTER — Ambulatory Visit (INDEPENDENT_AMBULATORY_CARE_PROVIDER_SITE_OTHER): Payer: 59 | Admitting: Family Medicine

## 2018-07-17 ENCOUNTER — Encounter: Payer: Self-pay | Admitting: Family Medicine

## 2018-07-17 DIAGNOSIS — R42 Dizziness and giddiness: Secondary | ICD-10-CM

## 2018-07-17 MED ORDER — MECLIZINE HCL 25 MG PO TABS: 25.0000 mg | ORAL_TABLET | Freq: Three times a day (TID) | ORAL | 1 refills | Status: DC | PRN

## 2018-07-17 NOTE — Assessment & Plan Note (Signed)
Mild and positional  Has had it before Px meclizine (helped in the past)  One migraine/no other neuro symptoms and no viral symptoms  No s/s of dehydration  She will continue maneuver for vertigo as needed (epely) Update if not starting to improve in a week or if worsening   Disc poss of relation to migraine as well/will watch that

## 2018-07-17 NOTE — Progress Notes (Signed)
Virtual Visit via Video Note  I connected with Evelyn Shepherd on 07/17/18 at  3:45 PM EDT by a video enabled telemedicine application and verified that I am speaking with the correct person using two identifiers.  Location: Patient: home Provider: office    I discussed the limitations of evaluation and management by telemedicine and the availability of in person appointments. The patient expressed understanding and agreed to proceed. Pt's camera could not be enabled for video contact so visit was conducted by phone   History of Present Illness: Pt presents with symptoms of vertigo   For about a week she has had vertigo again  Has had it in the past   Worse to change position  In bed  If on the floor to do an exercise or bend over to get dressed  Feels like she is spinning  It improves if she stays still  Worse to turn head or look up or down  Does not get severe   Unsure what brought it on  No ear symptoms  No congestion    Has migraines  Had a headache yesterday -better today   No new medicines  She has been drinking lots of fluids /not going in the heat a lot  occ at the pool / not a lot     Tried the epley maneuver   In the past meclizine helps      Wt Readings from Last 3 Encounters:  10/17/17 227 lb (103 kg)  09/20/17 220 lb (99.8 kg)  08/11/17 222 lb 9.6 oz (101 kg)   BP Readings from Last 3 Encounters:  10/17/17 128/78  09/20/17 94/62  08/11/17 99/66   Pulse Readings from Last 3 Encounters:  10/17/17 68  09/20/17 75  08/11/17 86   Review of Systems  Constitutional: Negative for chills, diaphoresis, fever and weight loss.       Tired lately-has changed sleep habits and this helps  HENT: Negative for congestion, ear discharge, ear pain, hearing loss, sinus pain, sore throat and tinnitus.   Eyes: Negative for blurred vision, discharge and redness.  Respiratory: Negative for cough and shortness of breath.   Cardiovascular: Negative for chest pain,  palpitations and leg swelling.  Gastrointestinal: Negative for nausea and vomiting.  Genitourinary: Negative for dysuria.  Neurological: Positive for dizziness and headaches. Negative for tingling, tremors, sensory change, speech change, focal weakness, seizures and loss of consciousness.  Psychiatric/Behavioral:       Mood is ok     Patient Active Problem List   Diagnosis Date Noted  . Vertigo 07/17/2018  . Right ankle pain 10/17/2017  . History of shingles 01/22/2016  . Bursitis of left hip 01/29/2015  . Low back pain 12/26/2013  . Brachioradial pruritus 11/07/2013  . Fatigue 11/07/2013  . Encounter for routine gynecological examination 04/11/2013  . Plantar fasciitis 04/11/2013  . Colon cancer screening 02/13/2013  . Screening for breast cancer 04/13/2011  . Special screening for malignant neoplasms, colon 04/13/2011  . Seborrheic dermatitis 04/13/2011  . Routine general medical examination at a health care facility 03/25/2011  . AMENORRHEA 09/18/2008  . Hypothyroidism 08/11/2007  . LOW BACK PAIN, CHRONIC 07/14/2006  . Hyperglycemia 07/06/2006  . Hyperlipidemia 07/06/2006  . DEPRESSION 07/06/2006  . COMMON MIGRAINE 07/06/2006  . GERD 07/06/2006   Past Medical History:  Diagnosis Date  . History of depression   . History of gastroesophageal reflux (GERD)   . History of hyperlipidemia   . History of migraine   .  History of obesity    lap band surgery  . History of pneumonia 2005   Past Surgical History:  Procedure Laterality Date  . AUGMENTATION MAMMAPLASTY Bilateral 1991  . BREAST ENHANCEMENT SURGERY    . CESAREAN SECTION    . DENTAL SURGERY    . LAPAROSCOPIC GASTRIC BANDING  07/31/07  . LUMBAR DISC SURGERY    . TONGUE SURGERY  03/2005   lesion removal   Social History   Tobacco Use  . Smoking status: Former Smoker    Types: Cigarettes    Quit date: 09/25/2017    Years since quitting: 0.8  . Smokeless tobacco: Never Used  Substance Use Topics  . Alcohol  use: Yes    Alcohol/week: 0.0 standard drinks    Comment: occasional on monthly basis  . Drug use: No   Family History  Problem Relation Age of Onset  . Pneumonia Father        died  . Lung cancer Father   . Kidney cancer Mother   . Diabetes Mother   . Depression Brother        commited suicide  . Alcohol abuse Brother   . Early menopause Other        family  . Breast cancer Paternal Aunt   . Breast cancer Paternal Aunt   . Colon cancer Neg Hx   . Pancreatic cancer Neg Hx   . Rectal cancer Neg Hx   . Stomach cancer Neg Hx    Allergies  Allergen Reactions  . Meloxicam     REACTION: unspecified  . Sulfonamide Derivatives     REACTION: unspecified   Current Outpatient Medications on File Prior to Visit  Medication Sig Dispense Refill  . atorvastatin (LIPITOR) 10 MG tablet Take 1 tablet (10 mg total) by mouth daily. 90 tablet 3  . escitalopram (LEXAPRO) 20 MG tablet Take 1 tablet (20 mg total) by mouth daily. 90 tablet 1  . fluticasone (FLONASE) 50 MCG/ACT nasal spray PLACE 1 SPRAY INTO BOTH NOSTRILS 2 (TWO) TIMES DAILY. (Patient taking differently: Place 1 spray into both nostrils 2 (two) times daily as needed. ) 16 g 2  . ketoconazole (NIZORAL) 2 % cream APPLY TO AREAS OF FACE THAT ARE AFFECTED ONCE DAILY 60 g 0  . metaxalone (SKELAXIN) 800 MG tablet TAKE 1 TABLET BY MOUTH 3 TIMES DAILY AS NEEDED FOR BACK PAIN 30 tablet 0  . SUMAtriptan (IMITREX) 100 MG tablet TAKE 1 TABLET BY MOUTH DAILY AS NEEDED FOR MIGRAINE. 36 tablet 1  . varenicline (CHANTIX PAK) 0.5 MG X 11 & 1 MG X 42 tablet Take one 0.5 mg tablet by mouth once daily for 3 days, then increase to one 0.5 mg tablet twice daily for 4 days, then increase to one 1 mg tablet twice daily. 53 tablet 0  . varenicline (CHANTIX) 1 MG tablet Take 1 tablet (1 mg total) by mouth 2 (two) times daily. Start after finishing starter pack 60 tablet 3   No current facility-administered medications on file prior to visit.      Observations/Objective: Pt sounds cheerful/like her normal self  Not hoarse or slurred  No cough or sob with speech Cognitively normal  Affect -normal /talkative with good mood  Not distressed or seemingly in pain  Hearing is grossly normal   Assessment and Plan: Problem List Items Addressed This Visit      Other   Vertigo    Mild and positional  Has had it before Px meclizine (helped  in the past)  One migraine/no other neuro symptoms and no viral symptoms  No s/s of dehydration  She will continue maneuver for vertigo as needed (epely) Update if not starting to improve in a week or if worsening   Disc poss of relation to migraine as well/will watch that           Follow Up Instructions: Change position slowly  Use meclizine as needed for dizziness with caution of sedation  If symptoms worsen please alert [place]  If headache returns or you develop other neurologic symptoms like facial droop/speech problems or weakness -alert [place] and go to ER if needed   You can try the Epley maneuver if tolerated   Update if not starting to improve in a week or if worsening     I discussed the assessment and treatment plan with the patient. The patient was provided an opportunity to ask questions and all were answered. The patient agreed with the plan and demonstrated an understanding of the instructions.   The patient was advised to call back or seek an in-person evaluation if the symptoms worsen or if the condition fails to improve as anticipated.     Loura Pardon, MD

## 2018-07-17 NOTE — Patient Instructions (Signed)
Change position slowly  Use meclizine as needed for dizziness with caution of sedation  If symptoms worsen please alert [place]  If headache returns or you develop other neurologic symptoms like facial droop/speech problems or weakness -alert [place] and go to ER if needed   You can try the Epley maneuver if tolerated   Update if not starting to improve in a week or if worsening

## 2018-09-25 ENCOUNTER — Emergency Department
Admission: EM | Admit: 2018-09-25 | Discharge: 2018-09-25 | Disposition: A | Discharge status: Home / Self Care | Payer: 59 | Attending: Student in an Organized Health Care Education/Training Program | Admitting: Student in an Organized Health Care Education/Training Program

## 2018-09-25 ENCOUNTER — Emergency Department: Payer: 59

## 2018-09-25 ENCOUNTER — Other Ambulatory Visit: Payer: Self-pay

## 2018-09-25 DIAGNOSIS — N3090 Cystitis, unspecified without hematuria: Secondary | ICD-10-CM | POA: Insufficient documentation

## 2018-09-25 DIAGNOSIS — E039 Hypothyroidism, unspecified: Secondary | ICD-10-CM | POA: Diagnosis not present

## 2018-09-25 DIAGNOSIS — Z79899 Other long term (current) drug therapy: Secondary | ICD-10-CM | POA: Diagnosis not present

## 2018-09-25 DIAGNOSIS — Z87891 Personal history of nicotine dependence: Secondary | ICD-10-CM | POA: Diagnosis not present

## 2018-09-25 DIAGNOSIS — R300 Dysuria: Secondary | ICD-10-CM | POA: Diagnosis present

## 2018-09-25 LAB — URINALYSIS, COMPLETE (UACMP) WITH MICROSCOPIC
Bacteria, UA: NONE SEEN
Bilirubin Urine: NEGATIVE
Glucose, UA: NEGATIVE mg/dL
Ketones, ur: NEGATIVE mg/dL
Nitrite: POSITIVE — AB
Protein, ur: 100 mg/dL — AB
RBC / HPF: >50 RBC/hpf — ABNORMAL HIGH (ref 0–5)
Specific Gravity, Urine: 1.018 (ref 1.005–1.030)
Squamous Epithelial / LPF: NONE SEEN (ref 0–5)
WBC, UA: >50 WBC/hpf — ABNORMAL HIGH (ref 0–5)
pH: 7 (ref 5.0–8.0)

## 2018-09-25 LAB — CBC WITH DIFFERENTIAL/PLATELET
Abs Immature Granulocytes: 0.03 10*3/uL (ref 0.00–0.07)
Basophils Absolute: 0.1 10*3/uL (ref 0.0–0.1)
Basophils Relative: 1 %
Eosinophils Absolute: 0.2 10*3/uL (ref 0.0–0.5)
Eosinophils Relative: 2 %
HCT: 35.9 % — ABNORMAL LOW (ref 36.0–46.0)
Hemoglobin: 12.1 g/dL (ref 12.0–15.0)
Immature Granulocytes: 0 %
Lymphocytes Relative: 29 %
Lymphs Abs: 2.3 10*3/uL (ref 0.7–4.0)
MCH: 29.2 pg (ref 26.0–34.0)
MCHC: 33.7 g/dL (ref 30.0–36.0)
MCV: 86.5 fL (ref 80.0–100.0)
Monocytes Absolute: 0.4 10*3/uL (ref 0.1–1.0)
Monocytes Relative: 5 %
Neutro Abs: 5 10*3/uL (ref 1.7–7.7)
Neutrophils Relative %: 63 %
Platelets: 209 10*3/uL (ref 150–400)
RBC: 4.15 MIL/uL (ref 3.87–5.11)
RDW: 12.9 % (ref 11.5–15.5)
WBC: 8 10*3/uL (ref 4.0–10.5)
nRBC: 0 % (ref 0.0–0.2)

## 2018-09-25 LAB — COMPREHENSIVE METABOLIC PANEL
ALT: 18 U/L (ref 0–44)
AST: 17 U/L (ref 15–41)
Albumin: 3.9 g/dL (ref 3.5–5.0)
Alkaline Phosphatase: 64 U/L (ref 38–126)
Anion gap: 8 (ref 5–15)
BUN: 19 mg/dL (ref 6–20)
CO2: 24 mmol/L (ref 22–32)
Calcium: 8.7 mg/dL — ABNORMAL LOW (ref 8.9–10.3)
Chloride: 108 mmol/L (ref 98–111)
Creatinine, Ser: 0.71 mg/dL (ref 0.44–1.00)
GFR calc Af Amer: >60 mL/min (ref >60)
GFR calc non Af Amer: >60 mL/min (ref >60)
Glucose, Bld: 107 mg/dL — ABNORMAL HIGH (ref 70–99)
Potassium: 3.9 mmol/L (ref 3.5–5.1)
Sodium: 140 mmol/L (ref 135–145)
Total Bilirubin: 0.5 mg/dL (ref 0.3–1.2)
Total Protein: 6.8 g/dL (ref 6.5–8.1)

## 2018-09-25 MED ORDER — CEPHALEXIN 500 MG PO CAPS: 500.0000 mg | ORAL_CAPSULE | Freq: Three times a day (TID) | ORAL | 0 refills | Status: AC

## 2018-09-25 MED ORDER — CEFTRIAXONE SODIUM 1 G IJ SOLR
1.0000 g | Freq: Once | INTRAMUSCULAR | Status: AC
  Administered 2018-09-25: 1 g via INTRAMUSCULAR
  Filled 2018-09-25: qty 10

## 2018-09-25 NOTE — ED Provider Notes (Signed)
[facility] Emergency Department Provider Note  ____________________________________________  Time seen: Approximately 9:01 PM  I have reviewed the triage vital signs and the nursing notes.   HISTORY  Chief Complaint Dysuria    HPI Evelyn Shepherd is a 58 y.o. female presents to the emergency department with pain and throbbing sensation around her urethra and gross hematuria.  Patient reports a history of pyelonephritis and nephrolithiasis.  She states that she has noticed blood and clots in her toilet bowl.  Patient denies vaginal bleeding.  She has had bilateral low back pain that does not radiate.  She denies fever or chills.  She reports that her symptoms started yesterday.  She denies abdominal pain.  No other alleviating measures have been attempted.        Past Medical History:  Diagnosis Date  . History of depression   . History of gastroesophageal reflux (GERD)   . History of hyperlipidemia   . History of migraine   . History of obesity    lap band surgery  . History of pneumonia 2005    Patient Active Problem List   Diagnosis Date Noted  . Vertigo 07/17/2018  . Right ankle pain 10/17/2017  . History of shingles 01/22/2016  . Bursitis of left hip 01/29/2015  . Low back pain 12/26/2013  . Brachioradial pruritus 11/07/2013  . Fatigue 11/07/2013  . Encounter for routine gynecological examination 04/11/2013  . Plantar fasciitis 04/11/2013  . Colon cancer screening 02/13/2013  . Screening for breast cancer 04/13/2011  . Special screening for malignant neoplasms, colon 04/13/2011  . Seborrheic dermatitis 04/13/2011  . Routine general medical examination at a health care facility 03/25/2011  . AMENORRHEA 09/18/2008  . Hypothyroidism 08/11/2007  . LOW BACK PAIN, CHRONIC 07/14/2006  . Hyperglycemia 07/06/2006  . Hyperlipidemia 07/06/2006  . DEPRESSION 07/06/2006  . COMMON MIGRAINE 07/06/2006  . GERD 07/06/2006    Past Surgical History:   Procedure Laterality Date  . AUGMENTATION MAMMAPLASTY Bilateral 1991  . BREAST ENHANCEMENT SURGERY    . CESAREAN SECTION    . DENTAL SURGERY    . LAPAROSCOPIC GASTRIC BANDING  07/31/07  . LUMBAR DISC SURGERY    . TONGUE SURGERY  03/2005   lesion removal    Prior to Admission medications   Medication Sig Start Date End Date Taking? Authorizing Provider  atorvastatin (LIPITOR) 10 MG tablet Take 1 tablet (10 mg total) by mouth daily. 06/08/17   Tower, Wynelle Fanny, MD  cephALEXin (KEFLEX) 500 MG capsule Take 1 capsule (500 mg total) by mouth 3 (three) times daily for 7 days. 09/25/18 10/02/18  Lannie Fields, PA-C  escitalopram (LEXAPRO) 20 MG tablet Take 1 tablet (20 mg total) by mouth daily. 06/15/18   Tower, Wynelle Fanny, MD  fluticasone (FLONASE) 50 MCG/ACT nasal spray PLACE 1 SPRAY INTO BOTH NOSTRILS 2 (TWO) TIMES DAILY. Patient taking differently: Place 1 spray into both nostrils 2 (two) times daily as needed.  04/22/17   Pleas Koch, NP  ketoconazole (NIZORAL) 2 % cream APPLY TO AREAS OF FACE THAT ARE AFFECTED ONCE DAILY 01/26/18   Tower, Wynelle Fanny, MD  meclizine (ANTIVERT) 25 MG tablet Take 1 tablet (25 mg total) by mouth 3 (three) times daily as needed for dizziness or nausea. Caution of sedation 07/17/18   Tower, Wynelle Fanny, MD  metaxalone (SKELAXIN) 800 MG tablet TAKE 1 TABLET BY MOUTH 3 TIMES DAILY AS NEEDED FOR BACK PAIN 05/11/18   Tower, Wynelle Fanny, MD  SUMAtriptan [facility])  100 MG tablet TAKE 1 TABLET BY MOUTH DAILY AS NEEDED FOR MIGRAINE. 06/15/18   Tower, Wynelle Fanny, MD  varenicline (CHANTIX PAK) 0.5 MG X 11 & 1 MG X 42 tablet Take one 0.5 mg tablet by mouth once daily for 3 days, then increase to one 0.5 mg tablet twice daily for 4 days, then increase to one 1 mg tablet twice daily. 07/07/18   Tower, Wynelle Fanny, MD  varenicline (CHANTIX) 1 MG tablet Take 1 tablet (1 mg total) by mouth 2 (two) times daily. Start after finishing starter pack 07/07/18   Tower, Wynelle Fanny, MD    Allergies Meloxicam and  Sulfonamide derivatives  Family History  Problem Relation Age of Onset  . Pneumonia Father        died  . Lung cancer Father   . Kidney cancer Mother   . Diabetes Mother   . Depression Brother        commited suicide  . Alcohol abuse Brother   . Early menopause Other        family  . Breast cancer Paternal Aunt   . Breast cancer Paternal Aunt   . Colon cancer Neg Hx   . Pancreatic cancer Neg Hx   . Rectal cancer Neg Hx   . Stomach cancer Neg Hx     Social History Social History   Tobacco Use  . Smoking status: Former Smoker    Types: Cigarettes    Quit date: 09/25/2017    Years since quitting: 1.0  . Smokeless tobacco: Never Used  Substance Use Topics  . Alcohol use: Yes    Alcohol/week: 0.0 standard drinks    Comment: occasional on monthly basis  . Drug use: No     Review of Systems  Constitutional: No fever/chills Eyes: No visual changes. No discharge ENT: No upper respiratory complaints. Cardiovascular: no chest pain. Respiratory: no cough. No SOB. Gastrointestinal: No abdominal pain.  No nausea, no vomiting.  No diarrhea.  No constipation. Genitourinary: Patient has hematuria and vaginal pain. Musculoskeletal: Negative for musculoskeletal pain. Skin: Negative for rash, abrasions, lacerations, ecchymosis. Neurological: Negative for headaches, focal weakness or numbness.   ____________________________________________   PHYSICAL EXAM:  VITAL SIGNS: ED Triage Vitals  Enc Vitals Group     BP 09/25/18 2009 (!) 110/45     Pulse Rate 09/25/18 2009 84     Resp 09/25/18 2009 20     Temp 09/25/18 2009 (!) 97.5 F (36.4 C)     Temp Source 09/25/18 2009 Oral     SpO2 09/25/18 2009 97 %     Weight 09/25/18 2010 220 lb (99.8 kg)     Height 09/25/18 2010 5\' 6"  (1.676 m)     Head Circumference --      Peak Flow --      Pain Score 09/25/18 2010 3     Pain Loc --      Pain Edu? --      Excl. in [place]? --      Constitutional: Alert and oriented. Well  appearing and in no acute distress. Eyes: Conjunctivae are normal. PERRL. EOMI. Head: Atraumatic. Cardiovascular: Normal rate, regular rhythm. Normal S1 and S2.  Good peripheral circulation. Respiratory: Normal respiratory effort without tachypnea or retractions. Lungs CTAB. Good air entry to the bases with no decreased or absent breath sounds. Gastrointestinal: Bowel sounds 4 quadrants. Soft and nontender to palpation. No guarding or rigidity. No palpable masses. No distention. No CVA tenderness. Musculoskeletal: Full range of motion  to all extremities. No gross deformities appreciated. Neurologic:  Normal speech and language. No gross focal neurologic deficits are appreciated.  Skin:  Skin is warm, dry and intact. No rash noted. Psychiatric: Mood and affect are normal. Speech and behavior are normal. Patient exhibits appropriate insight and judgement.   ____________________________________________   LABS (all labs ordered are listed, but only abnormal results are displayed)  Labs Reviewed  URINALYSIS, COMPLETE (UACMP) WITH MICROSCOPIC - Abnormal; Notable for the following components:      Result Value   Color, Urine YELLOW (*)    APPearance CLOUDY (*)    Hgb urine dipstick LARGE (*)    Protein, ur 100 (*)    Nitrite POSITIVE (*)    Leukocytes,Ua MODERATE (*)    RBC / HPF >50 (*)    WBC, UA >50 (*)    Non Squamous Epithelial PRESENT (*)    All other components within normal limits  CBC WITH DIFFERENTIAL/PLATELET - Abnormal; Notable for the following components:   HCT 35.9 (*)    All other components within normal limits  COMPREHENSIVE METABOLIC PANEL - Abnormal; Notable for the following components:   Glucose, Bld 107 (*)    Calcium 8.7 (*)    All other components within normal limits   ____________________________________________  EKG   ____________________________________________  RADIOLOGY I personally viewed and evaluated these images as part of my medical decision  making, as well as reviewing the written report by the radiologist.    Ct Renal Stone Study  Result Date: 09/25/2018 CLINICAL DATA:  Urinary frequency and history of hematuria EXAM: CT ABDOMEN AND PELVIS WITHOUT CONTRAST TECHNIQUE: Multidetector CT imaging of the abdomen and pelvis was performed following the standard protocol without IV contrast. COMPARISON:  09/14/2017 FINDINGS: Lower chest: No acute abnormality. Hepatobiliary: No focal liver abnormality is seen. No gallstones, gallbladder wall thickening, or biliary dilatation. Pancreas: Unremarkable. No pancreatic ductal dilatation or surrounding inflammatory changes. Spleen: Normal in size without focal abnormality. Adrenals/Urinary Tract: Adrenal glands are within normal limits bilaterally. Kidneys are well visualized. No renal calculi or obstructive changes are seen. Exophytic hypodensities are noted within the right kidney consistent with small cysts. Bladder is partially distended. Mild inflammatory changes in the soft tissue surrounding the bladder are seen. This could be related to mild cystitis. No other focal abnormality is noted. 2 Stomach/Bowel: No obstructive or inflammatory changes of the colon are seen. The appendix is within normal limits. No small bowel abnormality is noted. Gastric lap band is seen in satisfactory position. Fluid is noted within the distal esophagus. Vascular/Lymphatic: Aortic atherosclerosis. No enlarged abdominal or pelvic lymph nodes. Reproductive: Uterus and bilateral adnexa are unremarkable. Other: No abdominal wall hernia or abnormality. No abdominopelvic ascites. Musculoskeletal: No acute or significant osseous findings. IMPRESSION: Mild inflammatory changes surrounding the inferior aspect of the urinary bladder. This could represent some mild cystitis. Gastric lap band in satisfactory position. Small right renal cysts. Electronically Signed   By: Inez Catalina M.D.   On: 09/25/2018 21:33     ____________________________________________    PROCEDURES  Procedure(s) performed:    Procedures    Medications  cefTRIAXone (ROCEPHIN) injection 1 g (1 g Intramuscular Given 09/25/18 2153)     ____________________________________________   INITIAL IMPRESSION / ASSESSMENT AND PLAN / ED COURSE  Pertinent labs & imaging results that were available during my care of the patient were reviewed by me and considered in my medical decision making (see chart for details).  Review of the [place] CSRS was  performed in accordance of the [place] prior to dispensing any controlled drugs.         Assessment and Plan:  Pyelonephritis 58 year old female presents to the emergency department with bilateral low back pain and hematuria for the past 24 hours.  Patient had no significant CVA tenderness on exam or suprapubic pain to palpation.  Vital signs were reassuring at triage.  Differential diagnosis cystitis, pyelonephritis, nephrolithiasis...  Urinalysis was concerning for cystitis with blood, nitrates, white blood cells.  CT renal stone study reveals no evidence of nephrolithiasis.  Patient was given IM Rocephin in the emergency department and she was discharged with Keflex.  Return precautions were given to return to the emergency department with worsening low back pain, nausea, vomiting or fever.  She voiced understanding and has easy access to the ED.  All patient questions were answered.    ____________________________________________  FINAL CLINICAL IMPRESSION(S) / ED DIAGNOSES  Final diagnoses:  Cystitis      NEW MEDICATIONS STARTED DURING THIS VISIT:  ED Discharge Orders         Ordered    cephALEXin (KEFLEX) 500 MG capsule  3 times daily     09/25/18 2148              This chart was dictated using voice recognition software/Dragon. Despite best efforts to proofread, errors can occur which can change the meaning. Any change was purely unintentional.     Lannie Fields, PA-C 09/25/18 2300    Merlyn Lot, MD 09/25/18 2340

## 2018-09-25 NOTE — ED Triage Notes (Addendum)
Pt in with co urinary frequency and pain during urination. Pt states she has had blood in urine in the past but unsure of diagnosis. Pt states symptoms started yesterday, no fever. Pt co suprapubic pain.

## 2018-09-26 ENCOUNTER — Telehealth: Payer: Self-pay

## 2018-09-26 NOTE — Telephone Encounter (Signed)
Per chart review tab pt went to [facility] ED on 09/25/18.

## 2018-09-26 NOTE — Telephone Encounter (Signed)
Evelyn Shepherd - Client TELEPHONE ADVICE RECORD [facility] Patient Name: Evelyn Shepherd Gender: Female DOB: 01-31-1960 Age: 58 Y 62 D Return Phone Number: [phone] (Primary), [phone] (Secondary) Address: City/State/Zip: [place] [address] Client Evelyn Shepherd - Client Client Site [place] Physician Tower, Roque Lias - MD Contact Type Call Who Is Calling Patient / Member / Family / Caregiver Call Type Triage / Clinical Relationship To Patient Self Return Phone Number [phone] (Primary) Chief Complaint Abdominal Pain Reason for Call Symptomatic / Request for [place] states she is having abdominal pain, bleeding, pain when urinating, and back pain. Translation No Nurse Assessment Nurse: Mancel Bale, RN, Butch Penny Date/Time Eilene Ghazi Time): 09/25/2018 7:23:08 PM Confirm and document reason for call. If symptomatic, describe symptoms. ---Caller states she started having abdominal pain and back pain this morning, pain with urination, and bleeding with urination. She was seen by a urologist last year for blood in urine. Has the patient had close contact with a person known or suspected to have the novel coronavirus illness OR traveled / lives in area with major community spread (including international travel) in the last 14 days from the onset of symptoms? * If Asymptomatic, screen for exposure and travel within the last 14 days. ---No Does the patient have any new or worsening symptoms? ---Yes Will a triage be completed? ---Yes Related visit to physician within the last 2 weeks? ---No Does the PT have any chronic conditions? (i.e. diabetes, asthma, this includes High risk factors for pregnancy, etc.) ---Yes List chronic conditions. ---Migraines Is this a behavioral health or substance abuse call? ---No Guidelines Guideline Title Affirmed Question Affirmed Notes  Nurse Date/Time (Eastern Time) Flank Pain [1] SEVERE pain (e.g., excruciating, scale 8-10) AND [2] present > 1 hour Sheran Fava 09/25/2018 7:27:49 PM Disp. Time Eilene Ghazi Time) Disposition Final User 09/25/2018 7:30:26 PM Go to ED Now Yes Mancel Bale, RN, Annitta [place] NOTE: All timestamps contained within this report are represented as [place] Standard Time. CONFIDENTIALTY NOTICE: This fax transmission is intended only for the addressee. It contains information that is legally privileged, confidential or otherwise protected from use or disclosure. If you are not the intended recipient, you are strictly prohibited from reviewing, disclosing, copying using or disseminating any of this information or taking any action in reliance on or regarding this information. If you have received this fax in error, please notify us immediately by telephone so that we can arrange for its return to [place]. Phone: [phone], Toll-Free: [phone], Fax: [phone] Page: 2 of 2 Call Id: [id number] [place] Disagree/Comply Comply Caller Understands Yes PreDisposition InappropriateToAsk Care Advice Given Per Guideline GO TO ED NOW: * You need to be seen in the Emergency Department. * Go to the ED at ___________ [facility] [place] now. Drive carefully. DRIVING: Another adult should drive. BRING MEDICINES: * Please bring a list of your current medicines when you go to the Emergency Department (ER). * It is also a good idea to bring the pill bottles too. This will help the doctor to make certain you are taking the right medicines and the right dose. Referrals [facility] - ED

## 2018-09-29 ENCOUNTER — Ambulatory Visit: Payer: 59 | Admitting: Family Medicine

## 2018-09-29 DIAGNOSIS — Z0289 Encounter for other administrative examinations: Secondary | ICD-10-CM

## 2018-10-11 ENCOUNTER — Ambulatory Visit (INDEPENDENT_AMBULATORY_CARE_PROVIDER_SITE_OTHER): Payer: 59

## 2018-10-11 ENCOUNTER — Other Ambulatory Visit: Payer: Self-pay

## 2018-10-11 ENCOUNTER — Ambulatory Visit: Payer: 59 | Admitting: Podiatry

## 2018-10-11 ENCOUNTER — Other Ambulatory Visit: Payer: Self-pay | Admitting: Podiatry

## 2018-10-11 ENCOUNTER — Encounter: Payer: Self-pay | Admitting: Podiatry

## 2018-10-11 VITALS — BP 92/52 | HR 63 | Resp 16

## 2018-10-11 DIAGNOSIS — S93491A Sprain of other ligament of right ankle, initial encounter: Secondary | ICD-10-CM

## 2018-10-11 DIAGNOSIS — M722 Plantar fascial fibromatosis: Secondary | ICD-10-CM

## 2018-10-11 DIAGNOSIS — M7750 Other enthesopathy of unspecified foot: Secondary | ICD-10-CM

## 2018-10-11 MED ORDER — METHYLPREDNISOLONE 4 MG PO TBPK: ORAL_TABLET | ORAL | 0 refills | Status: DC

## 2018-10-11 NOTE — Progress Notes (Signed)
Subjective:  Patient ID: Evelyn Shepherd, female    DOB: August 20, 1960,  MRN: [id number] HPI Chief Complaint  Patient presents with   Foot Pain    Midfoot/lateral foot/achilles and lateral/anterior ankle right - fell last year, multiple sprains over the years, swollen, weak, sometimes painful, no current treatment   New Patient (Initial Visit)    58 y.o. female presents with the above complaint.   ROS: Denies fever chills nausea vomiting muscle aches pains calf pain back pain chest pain shortness of breath.  Past Medical History:  Diagnosis Date   History of depression    History of gastroesophageal reflux (GERD)    History of hyperlipidemia    History of migraine    History of obesity    lap band surgery   History of pneumonia 2005   Past Surgical History:  Procedure Laterality Date   AUGMENTATION MAMMAPLASTY Bilateral 1991   BREAST ENHANCEMENT SURGERY     CESAREAN SECTION     DENTAL SURGERY     LAPAROSCOPIC GASTRIC BANDING  07/31/07   LUMBAR [place] SURGERY     TONGUE SURGERY  03/2005   lesion removal    Current Outpatient Medications:    atorvastatin (LIPITOR) 10 MG tablet, Take 1 tablet (10 mg total) by mouth daily., Disp: 90 tablet, Rfl: 3   escitalopram (LEXAPRO) 20 MG tablet, Take 1 tablet (20 mg total) by mouth daily., Disp: 90 tablet, Rfl: 1   fluticasone (FLONASE) 50 MCG/ACT nasal spray, PLACE 1 SPRAY INTO BOTH NOSTRILS 2 (TWO) TIMES DAILY. (Patient taking differently: Place 1 spray into both nostrils 2 (two) times daily as needed. ), Disp: 16 g, Rfl: 2   ketoconazole (NIZORAL) 2 % cream, APPLY TO AREAS OF FACE THAT ARE AFFECTED ONCE DAILY, Disp: 60 g, Rfl: 0   meclizine (ANTIVERT) 25 MG tablet, Take 1 tablet (25 mg total) by mouth 3 (three) times daily as needed for dizziness or nausea. Caution of sedation, Disp: 30 tablet, Rfl: 1   metaxalone (SKELAXIN) 800 MG tablet, TAKE 1 TABLET BY MOUTH 3 TIMES DAILY AS NEEDED FOR BACK PAIN, Disp: 30 tablet,  Rfl: 0   methylPREDNISolone (MEDROL DOSEPAK) 4 MG TBPK tablet, 6 day dose pack - take as directed, Disp: 21 tablet, Rfl: 0   SUMAtriptan (IMITREX) 100 MG tablet, TAKE 1 TABLET BY MOUTH DAILY AS NEEDED FOR MIGRAINE., Disp: 36 tablet, Rfl: 1  Allergies  Allergen Reactions   Meloxicam     REACTION: unspecified   Sulfonamide Derivatives     REACTION: unspecified   Review of Systems Objective:   Vitals:   10/11/18 1342  BP: (!) 92/52  Pulse: 63  Resp: 16    General: Well developed, nourished, in no acute distress, alert and oriented x3   Dermatological: Skin is warm, dry and supple bilateral. Nails x 10 are well maintained; remaining integument appears unremarkable at this time. There are no open sores, no preulcerative lesions, no rash or signs of infection present.  Vascular: Dorsalis Pedis artery and Posterior Tibial artery pedal pulses are 2/4 bilateral with immedate capillary fill time. Pedal hair growth present. No varicosities and no lower extremity edema present bilateral.   Neruologic: Grossly intact via light touch bilateral. Vibratory intact via tuning fork bilateral. Protective threshold with Semmes Wienstein monofilament intact to all pedal sites bilateral. Patellar and Achilles deep tendon reflexes 2+ bilateral. No Babinski or clonus noted bilateral.   Musculoskeletal: No gross boney pedal deformities bilateral. No pain, crepitus, or limitation noted with foot  and ankle range of motion bilateral. Muscular strength 5/5 in all groups tested bilateral.  She has great range of motion of the ankle joint itself.  She does have mild tenderness on palpation of the anterior lateral ankle overlying the anterior talofibular ligament and to some degree the calcaneofibular ligament.  The majority of her tenderness however is located over the plantar fascial calcaneal insertion site of the right heel.  There is no pain on medial and lateral compression of the calcaneus.  She does have  pain on direct palpation of the fourth fifth tarsometatarsal joint which is consistent with lateral compensatory syndrome and capsulitis of that joint.  She has some tenderness of the Achilles tendon though radiographically does not appear to be any thickening of the tendon nor any soft tissue swelling in the area.  Again this is consistent with lateral compensatory syndrome and plantar fasciitis.  Gait: Unassisted, Nonantalgic.    Radiographs:  Radiographs taken today do not demonstrate any type of osseous abnormalities only a soft tissue increase in density at the plantar fascial calcaneal insertion site of the right heel.  Some taut soft tissue swelling anterior ankle from lateral view.  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  Possible tear of the anterior talofibular ligament right foot.  Plan: We discussed etiology pathology conservative versus surgical therapies at this point were going to go ahead and get her an injection to the right heel 20 mg Kenalog 5 mg Marcaine point of maximal tenderness.  Start her on Medrol Dosepak to be followed by meloxicam.  Also going to place her in a plantar fascial brace and a night splint.  Discussed appropriate shoe gear stretching exercise ice therapy sugar modifications.  We will reassess the anterior lateral ligament next visit.  If is not improved consider MRI.     Evelyn Shepherd T. Evelyn Shepherd, [place]

## 2018-11-08 ENCOUNTER — Encounter: Payer: Self-pay | Admitting: Podiatry

## 2018-11-08 ENCOUNTER — Other Ambulatory Visit: Payer: Self-pay

## 2018-11-08 ENCOUNTER — Ambulatory Visit (INDEPENDENT_AMBULATORY_CARE_PROVIDER_SITE_OTHER): Payer: 59 | Admitting: Podiatry

## 2018-11-08 DIAGNOSIS — M722 Plantar fascial fibromatosis: Secondary | ICD-10-CM | POA: Diagnosis not present

## 2018-11-08 DIAGNOSIS — S93491D Sprain of other ligament of right ankle, subsequent encounter: Secondary | ICD-10-CM

## 2018-11-08 MED ORDER — DICLOFENAC SODIUM 75 MG PO TBEC: 75.0000 mg | DELAYED_RELEASE_TABLET | Freq: Two times a day (BID) | ORAL | 3 refills | Status: DC

## 2018-11-08 NOTE — Progress Notes (Signed)
She presents today for follow-up of her ankle sprain and a plantar fasciitis of the right foot.  States she is about 75% improved.  Objective: Vital signs are stable alert and oriented x3.  She has minimal tenderness on palpation of the ankle joint itself or of the anterior talofibular ligament she does have some tenderness on palpation of the sinus tarsi.  The majority of her symptoms are noted overlying the plantar fascia right.  Assessment: Resolving plantar fasciitis right ankle sprain right.  Plan: This point I reinjected the right heel 20 mg Kenalog 5 mg Marcaine point of maximal tenderness.  And I started her on diclofenac 1 or 2 tablets daily.  Follow-up with her in 1 month.

## 2018-11-20 ENCOUNTER — Other Ambulatory Visit: Payer: Self-pay | Admitting: Family Medicine

## 2018-11-21 NOTE — Telephone Encounter (Signed)
Pt's had a few acute appts. but no recent or future f/u or CPE appts., please advise

## 2018-11-21 NOTE — Telephone Encounter (Signed)
Med refilled once and [facility] will call pt to try and schedule appt

## 2018-11-21 NOTE — Telephone Encounter (Signed)
Please schedule a PE (f/u if she does not want PE) and refill until then Thanks

## 2018-11-23 ENCOUNTER — Encounter: Payer: Self-pay | Admitting: Family Medicine

## 2018-11-23 ENCOUNTER — Ambulatory Visit (INDEPENDENT_AMBULATORY_CARE_PROVIDER_SITE_OTHER): Payer: 59 | Admitting: Family Medicine

## 2018-11-23 ENCOUNTER — Other Ambulatory Visit: Payer: Self-pay

## 2018-11-23 VITALS — BP 122/80 | HR 64 | Temp 97.2°F | Ht 65.25 in | Wt 217.0 lb

## 2018-11-23 DIAGNOSIS — G43009 Migraine without aura, not intractable, without status migrainosus: Secondary | ICD-10-CM

## 2018-11-23 DIAGNOSIS — E039 Hypothyroidism, unspecified: Secondary | ICD-10-CM | POA: Diagnosis not present

## 2018-11-23 DIAGNOSIS — E7800 Pure hypercholesterolemia, unspecified: Secondary | ICD-10-CM

## 2018-11-23 DIAGNOSIS — R7303 Prediabetes: Secondary | ICD-10-CM

## 2018-11-23 DIAGNOSIS — R5382 Chronic fatigue, unspecified: Secondary | ICD-10-CM

## 2018-11-23 DIAGNOSIS — Z23 Encounter for immunization: Secondary | ICD-10-CM | POA: Diagnosis not present

## 2018-11-23 DIAGNOSIS — Z1231 Encounter for screening mammogram for malignant neoplasm of breast: Secondary | ICD-10-CM | POA: Insufficient documentation

## 2018-11-23 DIAGNOSIS — Z0000 Encounter for general adult medical examination without abnormal findings: Secondary | ICD-10-CM

## 2018-11-23 DIAGNOSIS — F339 Major depressive disorder, recurrent, unspecified: Secondary | ICD-10-CM

## 2018-11-23 DIAGNOSIS — R350 Frequency of micturition: Secondary | ICD-10-CM | POA: Diagnosis not present

## 2018-11-23 DIAGNOSIS — E538 Deficiency of other specified B group vitamins: Secondary | ICD-10-CM | POA: Insufficient documentation

## 2018-11-23 LAB — POC URINALSYSI DIPSTICK (AUTOMATED)
Bilirubin, UA: NEGATIVE
Glucose, UA: NEGATIVE
Ketones, UA: NEGATIVE
Leukocytes, UA: NEGATIVE
Nitrite, UA: NEGATIVE
Protein, UA: NEGATIVE
Spec Grav, UA: 1.025
Urobilinogen, UA: 0.2 U/dL
pH, UA: 6

## 2018-11-23 LAB — CBC WITH DIFFERENTIAL/PLATELET
Basophils Absolute: 0 10*3/uL (ref 0.0–0.1)
Basophils Relative: 0.7 % (ref 0.0–3.0)
Eosinophils Absolute: 0.1 10*3/uL (ref 0.0–0.7)
Eosinophils Relative: 1.5 % (ref 0.0–5.0)
HCT: 38.8 % (ref 36.0–46.0)
Hemoglobin: 13 g/dL (ref 12.0–15.0)
Lymphocytes Relative: 23.1 % (ref 12.0–46.0)
Lymphs Abs: 1.3 10*3/uL (ref 0.7–4.0)
MCHC: 33.4 g/dL (ref 30.0–36.0)
MCV: 88 fl (ref 78.0–100.0)
Monocytes Absolute: 0.3 10*3/uL (ref 0.1–1.0)
Monocytes Relative: 6.1 % (ref 3.0–12.0)
Neutro Abs: 3.9 10*3/uL (ref 1.4–7.7)
Neutrophils Relative %: 68.6 % (ref 43.0–77.0)
Platelets: 183 10*3/uL (ref 150.0–400.0)
RBC: 4.41 Mil/uL (ref 3.87–5.11)
RDW: 14.3 % (ref 11.5–15.5)
WBC: 5.7 10*3/uL (ref 4.0–10.5)

## 2018-11-23 LAB — COMPREHENSIVE METABOLIC PANEL
ALT: 29 U/L (ref 0–35)
AST: 19 U/L (ref 0–37)
Albumin: 4.3 g/dL (ref 3.5–5.2)
Alkaline Phosphatase: 69 U/L (ref 39–117)
BUN: 22 mg/dL (ref 6–23)
CO2: 27 mEq/L (ref 19–32)
Calcium: 9.2 mg/dL (ref 8.4–10.5)
Chloride: 106 mEq/L (ref 96–112)
Creatinine, Ser: 0.88 mg/dL (ref 0.40–1.20)
GFR: 65.96 mL/min (ref >60.00)
Glucose, Bld: 104 mg/dL — ABNORMAL HIGH (ref 70–99)
Potassium: 4.1 mEq/L (ref 3.5–5.1)
Sodium: 139 mEq/L (ref 135–145)
Total Bilirubin: 0.5 mg/dL (ref 0.2–1.2)
Total Protein: 6.9 g/dL (ref 6.0–8.3)

## 2018-11-23 LAB — LIPID PANEL
Cholesterol: 214 mg/dL — ABNORMAL HIGH (ref 0–200)
HDL: 64.1 mg/dL
LDL Cholesterol: 133 mg/dL — ABNORMAL HIGH (ref 0–99)
NonHDL: 149.65
Total CHOL/HDL Ratio: 3
Triglycerides: 81 mg/dL (ref 0.0–149.0)
VLDL: 16.2 mg/dL (ref 0.0–40.0)

## 2018-11-23 LAB — VITAMIN B12: Vitamin B-12: 133 pg/mL — ABNORMAL LOW (ref 211–911)

## 2018-11-23 LAB — HEMOGLOBIN A1C: Hgb A1c MFr Bld: 5.8 % (ref 4.6–6.5)

## 2018-11-23 LAB — TSH: TSH: 2.15 u[IU]/mL (ref 0.35–4.50)

## 2018-11-23 MED ORDER — SUMATRIPTAN SUCCINATE 100 MG PO TABS: ORAL_TABLET | ORAL | 3 refills | Status: DC

## 2018-11-23 MED ORDER — ESCITALOPRAM OXALATE 20 MG PO TABS: 20.0000 mg | ORAL_TABLET | Freq: Every day | ORAL | 3 refills | Status: DC

## 2018-11-23 NOTE — Assessment & Plan Note (Signed)
A1C today Pt is eating better disc imp of low glycemic diet and wt loss to prevent DM2

## 2018-11-23 NOTE — Assessment & Plan Note (Signed)
Had a recent uti UA today

## 2018-11-23 NOTE — Assessment & Plan Note (Signed)
Disc goals for lipids and reasons to control them Rev last labs with pt Rev low sat fat diet in detail Lab today  Currently off the atorvastatin to see how labs look with better diet If not improved-will plan to re start it

## 2018-11-23 NOTE — Assessment & Plan Note (Signed)
Scheduled annual screening mammogram Nl breast exam today  Encouraged monthly self exams   

## 2018-11-23 NOTE — Assessment & Plan Note (Signed)
Reviewed health habits including diet and exercise and skin cancer prevention Reviewed appropriate screening tests for age  Also reviewed health mt list, fam hx and immunization status , as well as social and family history   See HPI Labs drawn today  Mammogram ordered  Enc continued good diet and exercise  She plans to f/u with gyn for her routine exam and pap

## 2018-11-23 NOTE — Assessment & Plan Note (Signed)
No medication currently  Has been fatigued TSH today

## 2018-11-23 NOTE — Assessment & Plan Note (Signed)
B12 added to labs Low B12 runs in family

## 2018-11-23 NOTE — Assessment & Plan Note (Signed)
?   If this is the cause of funny feeling in L eye Will watch for headache or any other changes

## 2018-11-23 NOTE — Assessment & Plan Note (Signed)
Pt does well as long as she stays on her lexapro  Good health habits and self care

## 2018-11-23 NOTE — Progress Notes (Signed)
Subjective:    Patient ID: Evelyn Shepherd, female    DOB: 11/12/1960, 58 y.o.   MRN: [id number]  HPI Here for health maintenance exam and to review chronic medical problems    Getting by during the pandemic    Seeing opthy for L eye issue  Eye feels "heavy" like something it lying on it  Had nl exam  ? If migraine or sinus issue  Going on for about 3 weeks   Went to hospital for uti- bad one lately  Thinks that is back  L side of Evelyn Shepherd back is uncomfortable  Urine has an odor  No dysuria  Has frequency and urgency  No blood  No fever or n/v  Drinking lots of water   ua today: Results for orders placed or performed in visit on 11/23/18  POCT Urinalysis Dipstick (Automated)  Result Value Ref Range   Color, UA yellow    Clarity, UA clear    Glucose, UA Negative Negative   Bilirubin, UA negative    Ketones, UA negative    Spec Grav, UA 1.025 1.010 - 1.025   Blood, UA 2+    pH, UA 6.0 5.0 - 8.0   Protein, UA Negative Negative   Urobilinogen, UA 0.2 0.2 or 1.0 E.U./dL   Nitrite, UA negative    Leukocytes, UA Negative Negative   Often does have blood on dip Sent for cx   Wt Readings from Last 3 Encounters:  11/23/18 217 lb (98.4 kg)  09/25/18 220 lb (99.8 kg)  10/17/17 227 lb (103 kg)  Evelyn Shepherd had gained and then lost again (had gone up to 233 on Evelyn Shepherd scale) -lost more  Evelyn Shepherd cut out the butter/ dairy  Cut out bread  35.83 kg/m  H/o lap band procedure in 2009   Exercise - has been walking    BP Readings from Last 3 Encounters:  11/23/18 122/80  10/11/18 (!) 92/52  09/25/18 (!) 110/45   Pulse Readings from Last 3 Encounters:  11/23/18 64  10/11/18 63  09/25/18 84    Pap 4/15-neg with neg HPV screen Checking every 5 year  Evelyn Shepherd is about due for a pap  Evelyn Shepherd will schedule one with gyn   Flu vaccine - has not had it yet   Mammogram 11/18 -wants to schedule  Self breast exam no lumps or changes   Colonoscopy 1/17   Td 3/18   Zoster status -interested in  shingrix   Hypothyroidism  Pt has no clinical changes No change in energy level/ hair or skin/ edema and no tremor Due for TSH No medication currently   Mood- depression lexapro 20 mg Doing well - Evelyn Shepherd is good if Evelyn Shepherd stays on it    More fatigued lately  Would like B12 level checked - has a fam hx of low B12    Prediabetes Lab Results  Component Value Date   HGBA1C 6.4 07/19/2017  due for labs   Hyperlipidemia Lab Results  Component Value Date   CHOL 173 07/19/2017   HDL 52.30 07/19/2017   LDLCALC 99 07/19/2017   LDLDIRECT 187.8 10/22/2011   TRIG 107.0 07/19/2017   CHOLHDL 3 07/19/2017   Due for labs  Not taking Evelyn Shepherd atorvastatin - wants to see what it looks like off the atorvastatin   Patient Active Problem List   Diagnosis Date Noted  . Urinary frequency 11/23/2018  . Screening mammogram, encounter for 11/23/2018  . Vertigo 07/17/2018  . Right ankle pain 10/17/2017  .  History of shingles 01/22/2016  . Bursitis of left hip 01/29/2015  . Low back pain 12/26/2013  . Brachioradial pruritus 11/07/2013  . Fatigue 11/07/2013  . Encounter for routine gynecological examination 04/11/2013  . Plantar fasciitis 04/11/2013  . Colon cancer screening 02/13/2013  . Screening for breast cancer 04/13/2011  . Special screening for malignant neoplasms, colon 04/13/2011  . Seborrheic dermatitis 04/13/2011  . Routine general medical examination at a health care facility 03/25/2011  . AMENORRHEA 09/18/2008  . Hypothyroidism 08/11/2007  . LOW BACK PAIN, CHRONIC 07/14/2006  . Prediabetes 07/06/2006  . Hyperlipidemia 07/06/2006  . Depression, recurrent ([place]) 07/06/2006  . Migraine without aura 07/06/2006  . GERD 07/06/2006   Past Medical History:  Diagnosis Date  . History of depression   . History of gastroesophageal reflux (GERD)   . History of hyperlipidemia   . History of migraine   . History of obesity    lap band surgery  . History of pneumonia 2005   Past Surgical  History:  Procedure Laterality Date  . AUGMENTATION MAMMAPLASTY Bilateral 1991  . BREAST ENHANCEMENT SURGERY    . CESAREAN SECTION    . DENTAL SURGERY    . LAPAROSCOPIC GASTRIC BANDING  07/31/07  . LUMBAR DISC SURGERY    . TONGUE SURGERY  03/2005   lesion removal   Social History   Tobacco Use  . Smoking status: Former Smoker    Types: Cigarettes    Quit date: 09/25/2017    Years since quitting: 1.1  . Smokeless tobacco: Never Used  Substance Use Topics  . Alcohol use: Yes    Alcohol/week: 0.0 standard drinks    Comment: occasional on monthly basis  . Drug use: No   Family History  Problem Relation Age of Onset  . Pneumonia Father        died  . Lung cancer Father   . Kidney cancer Mother   . Diabetes Mother   . Depression Brother        commited suicide  . Alcohol abuse Brother   . Early menopause Other        family  . Breast cancer Paternal Aunt   . Breast cancer Paternal Aunt   . Colon cancer Neg Hx   . Pancreatic cancer Neg Hx   . Rectal cancer Neg Hx   . Stomach cancer Neg Hx    Allergies  Allergen Reactions  . Meloxicam     REACTION: unspecified  . Sulfonamide Derivatives     REACTION: unspecified   Current Outpatient Medications on File Prior to Visit  Medication Sig Dispense Refill  . diclofenac (VOLTAREN) 75 MG EC tablet Take 1 tablet (75 mg total) by mouth 2 (two) times daily. 60 tablet 3  . fluticasone (FLONASE) 50 MCG/ACT nasal spray PLACE 1 SPRAY INTO BOTH NOSTRILS 2 (TWO) TIMES DAILY. (Patient taking differently: Place 1 spray into both nostrils 2 (two) times daily as needed. ) 16 g 2  . ketoconazole (NIZORAL) 2 % cream APPLY TO AREAS OF FACE THAT ARE AFFECTED ONCE DAILY 60 g 0  . meclizine (ANTIVERT) 25 MG tablet Take 1 tablet (25 mg total) by mouth 3 (three) times daily as needed for dizziness or nausea. Caution of sedation 30 tablet 1  . metaxalone (SKELAXIN) 800 MG tablet TAKE 1 TABLET BY MOUTH 3 TIMES DAILY AS NEEDED FOR BACK PAIN 30 tablet  0  . atorvastatin (LIPITOR) 10 MG tablet Take 1 tablet (10 mg total) by mouth daily. (Patient not  taking: Reported on 11/23/2018) 90 tablet 3   No current facility-administered medications on file prior to visit.      Review of Systems  Constitutional: Negative for activity change, appetite change, fatigue, fever and unexpected weight change.  HENT: Negative for congestion, ear pain, rhinorrhea, sinus pressure and sore throat.   Eyes: Negative for pain, redness and visual disturbance.       L eye feels "heavy"  No abnormalities found at eye exam yesterday  Respiratory: Negative for cough, shortness of breath and wheezing.   Cardiovascular: Negative for chest pain and palpitations.  Gastrointestinal: Negative for abdominal pain, blood in stool, constipation and diarrhea.  Endocrine: Negative for polydipsia and polyuria.  Genitourinary: Negative for dysuria, frequency and urgency.  Musculoskeletal: Negative for arthralgias, back pain and myalgias.  Skin: Negative for pallor and rash.  Allergic/Immunologic: Negative for environmental allergies.  Neurological: Negative for dizziness, syncope and headaches.  Hematological: Negative for adenopathy. Does not bruise/bleed easily.  Psychiatric/Behavioral: Negative for decreased concentration and dysphoric mood. The patient is not nervous/anxious.        Objective:   Physical Exam Constitutional:      General: Evelyn Shepherd is not in acute distress.    Appearance: Normal appearance. Evelyn Shepherd is well-developed. Evelyn Shepherd is obese. Evelyn Shepherd is not ill-appearing or diaphoretic.  HENT:     Head: Normocephalic and atraumatic.     Comments: No facial asymmetry    Right Ear: Tympanic membrane, ear canal and external ear normal.     Left Ear: Tympanic membrane, ear canal and external ear normal.     Nose: Nose normal. No congestion.     Mouth/Throat:     Mouth: Mucous membranes are moist.     Pharynx: Oropharynx is clear. No posterior oropharyngeal erythema.  Eyes:      General: No scleral icterus.       Right eye: No discharge.        Left eye: No discharge.     Extraocular Movements: Extraocular movements intact.     Conjunctiva/sclera: Conjunctivae normal.     Pupils: Pupils are equal, round, and reactive to light.  Neck:     Musculoskeletal: Normal range of motion and neck supple. No neck rigidity or muscular tenderness.     Thyroid: No thyromegaly.     Vascular: No carotid bruit or JVD.  Cardiovascular:     Rate and Rhythm: Normal rate and regular rhythm.     Pulses: Normal pulses.     Heart sounds: Normal heart sounds. No gallop.   Pulmonary:     Effort: Pulmonary effort is normal. No respiratory distress.     Breath sounds: Normal breath sounds. No wheezing.     Comments: Good air exch Chest:     Chest wall: No tenderness.  Abdominal:     General: Bowel sounds are normal. There is no distension or abdominal bruit.     Palpations: Abdomen is soft. There is no mass.     Tenderness: There is no abdominal tenderness.     Hernia: No hernia is present.  Genitourinary:    Comments: Breast exam: No mass, nodules, thickening, tenderness, bulging, retraction, inflamation, nipple discharge or skin changes noted.  No axillary or clavicular LA.     Musculoskeletal: Normal range of motion.        General: No tenderness.     Right lower leg: No edema.     Left lower leg: No edema.  Lymphadenopathy:     Cervical: No cervical adenopathy.  Skin:    General: Skin is warm and dry.     Coloration: Skin is not pale.     Findings: No erythema or rash.     Comments: Tanned Few sks and lentigines  Neurological:     Mental Status: Evelyn Shepherd is alert. Mental status is at baseline.     Cranial Nerves: No cranial nerve deficit.     Motor: No abnormal muscle tone.     Coordination: Coordination normal.     Gait: Gait normal.     Deep Tendon Reflexes: Reflexes are normal and symmetric. Reflexes normal.  Psychiatric:        Mood and Affect: Mood normal.         Cognition and Memory: Cognition and memory normal.           Assessment & Plan:   Problem List Items Addressed This Visit      Cardiovascular and Mediastinum   Migraine without aura    ? If this is the cause of funny feeling in L eye Will watch for headache or any other changes      Relevant Medications   escitalopram (LEXAPRO) 20 MG tablet   SUMAtriptan (IMITREX) 100 MG tablet     Endocrine   Hypothyroidism    No medication currently  Has been fatigued TSH today      Relevant Orders   TSH     Other   Prediabetes    A1C today Pt is eating better disc imp of low glycemic diet and wt loss to prevent DM2       Relevant Orders   Hemoglobin A1c   Hyperlipidemia    Disc goals for lipids and reasons to control them Rev last labs with pt Rev low sat fat diet in detail Lab today  Currently off the atorvastatin to see how labs look with better diet If not improved-will plan to re start it       Relevant Orders   Lipid panel   Depression, recurrent ([place])    Pt does well as long as Evelyn Shepherd stays on Evelyn Shepherd lexapro  Good health habits and self care      Relevant Medications   escitalopram (LEXAPRO) 20 MG tablet   Routine general medical examination at a health care facility - Primary    Reviewed health habits including diet and exercise and skin cancer prevention Reviewed appropriate screening tests for age  Also reviewed health mt list, fam hx and immunization status , as well as social and family history   See HPI Labs drawn today  Mammogram ordered  Enc continued good diet and exercise  Evelyn Shepherd plans to f/u with gyn for Evelyn Shepherd routine exam and pap         Relevant Orders   CBC w/Diff   Comprehensive metabolic panel   Lipid panel   TSH   Fatigue    B12 added to labs Low B12 runs in family      Relevant Orders   Vitamin B12   Urinary frequency    Had a recent uti UA today      Screening mammogram, encounter for    Scheduled annual screening mammogram Nl  breast exam today  Encouraged monthly self exams        Relevant Orders   MM 3D SCREEN BREAST BILATERAL

## 2018-11-23 NOTE — Patient Instructions (Addendum)
Follow up with gyn for your pap and exam   The shingrix vaccine is available for shingles prevention  We have it / pharmacies have   Keep up the good work with diet/exercise  Let's see how cholesterol is off the medication   Urinalysis today  Flu shot today  Labs today

## 2018-11-24 LAB — URINE CULTURE
MICRO NUMBER:: 1094096
Result:: NO GROWTH
SPECIMEN QUALITY:: ADEQUATE

## 2018-11-28 ENCOUNTER — Telehealth: Payer: Self-pay | Admitting: Family Medicine

## 2018-11-28 ENCOUNTER — Ambulatory Visit (INDEPENDENT_AMBULATORY_CARE_PROVIDER_SITE_OTHER): Payer: 59 | Admitting: *Deleted

## 2018-11-28 DIAGNOSIS — E538 Deficiency of other specified B group vitamins: Secondary | ICD-10-CM | POA: Diagnosis not present

## 2018-11-28 DIAGNOSIS — E785 Hyperlipidemia, unspecified: Secondary | ICD-10-CM

## 2018-11-28 DIAGNOSIS — Z23 Encounter for immunization: Secondary | ICD-10-CM

## 2018-11-28 MED ORDER — ATORVASTATIN CALCIUM 10 MG PO TABS: 10.0000 mg | ORAL_TABLET | Freq: Every day | ORAL | 3 refills | Status: DC

## 2018-11-28 MED ORDER — CYANOCOBALAMIN 1000 MCG/ML IJ SOLN
1000.0000 ug | Freq: Once | INTRAMUSCULAR | Status: AC
  Administered 2018-11-28: 1000 ug via INTRAMUSCULAR

## 2018-11-28 NOTE — Progress Notes (Signed)
Per orders of Dr. Glori Bickers, injection of 12 and Shingrix vaccine given by [name]. Patient tolerated injection well.

## 2018-11-28 NOTE — Telephone Encounter (Signed)
-----   Message from [name], [place] sent at 11/28/2018  9:23 AM EST ----- Pt came in for her 1st b12 inj. I asked her about restarting the atorvastatin, she agrees to restart it, needs meds sent to [facility] employee pharmacy., pt is getting labs in Feb to check her B12 and asked if she could just get cholesterol checked then

## 2018-11-28 NOTE — Telephone Encounter (Signed)
I sent it  Feb is ok for labs

## 2018-11-29 NOTE — Telephone Encounter (Signed)
Sent mychart message letting pt know 

## 2018-12-05 ENCOUNTER — Ambulatory Visit (INDEPENDENT_AMBULATORY_CARE_PROVIDER_SITE_OTHER): Payer: 59 | Admitting: *Deleted

## 2018-12-05 DIAGNOSIS — E538 Deficiency of other specified B group vitamins: Secondary | ICD-10-CM

## 2018-12-05 MED ORDER — CYANOCOBALAMIN 1000 MCG/ML IJ SOLN
1000.0000 ug | Freq: Once | INTRAMUSCULAR | Status: AC
  Administered 2018-12-05: 1000 ug via INTRAMUSCULAR

## 2018-12-05 NOTE — Progress Notes (Signed)
Per orders of Dr. Tower, injection of Vit B12 given by Akeila Lana M. Patient tolerated injection well.  

## 2018-12-06 ENCOUNTER — Ambulatory Visit: Payer: 59 | Admitting: Family Medicine

## 2018-12-06 ENCOUNTER — Encounter: Payer: Self-pay | Admitting: *Deleted

## 2018-12-06 ENCOUNTER — Other Ambulatory Visit: Payer: Self-pay

## 2018-12-06 ENCOUNTER — Ambulatory Visit
Admission: RE | Admit: 2018-12-06 | Discharge: 2018-12-06 | Disposition: A | Discharge status: Home / Self Care | Payer: 59 | Attending: Emergency Medicine | Admitting: Emergency Medicine

## 2018-12-06 ENCOUNTER — Ambulatory Visit
Admission: RE | Admit: 2018-12-06 | Discharge: 2018-12-06 | Disposition: A | Discharge status: Home / Self Care | Payer: 59 | Source: Ambulatory Visit | Attending: Emergency Medicine | Admitting: Emergency Medicine

## 2018-12-06 ENCOUNTER — Ambulatory Visit
Admission: EM | Admit: 2018-12-06 | Discharge: 2018-12-06 | Disposition: A | Discharge status: Home / Self Care | Payer: 59 | Attending: Emergency Medicine | Admitting: Emergency Medicine

## 2018-12-06 DIAGNOSIS — R05 Cough: Secondary | ICD-10-CM

## 2018-12-06 DIAGNOSIS — Z20822 Contact with and (suspected) exposure to covid-19: Secondary | ICD-10-CM

## 2018-12-06 DIAGNOSIS — Z20828 Contact with and (suspected) exposure to other viral communicable diseases: Secondary | ICD-10-CM

## 2018-12-06 DIAGNOSIS — R059 Cough, unspecified: Secondary | ICD-10-CM

## 2018-12-06 MED ORDER — AZITHROMYCIN 250 MG PO TABS: 250.0000 mg | ORAL_TABLET | Freq: Every day | ORAL | 0 refills | Status: DC

## 2018-12-06 MED ORDER — BENZONATATE 100 MG PO CAPS: 100.0000 mg | ORAL_CAPSULE | Freq: Three times a day (TID) | ORAL | 0 refills | Status: DC | PRN

## 2018-12-06 NOTE — ED Triage Notes (Addendum)
Patient reports productive cough, shortness of breath, fatigue and nasal congestion x 2 weeks. Denies fevers. Patient has used OTC meds without relief to include tusinex and day/night quil.

## 2018-12-06 NOTE — ED Notes (Signed)
O2 sats rechecked, patient noted with O2 sats 90-92% after coughing. O2 sats otherwise noted to be 94-96%, provider updated.

## 2018-12-06 NOTE — Discharge Instructions (Addendum)
Go to [facility] for your chest x-ray.  I will call you with the results this afternoon.  Take the antibiotic as directed.    Your COVID test is pending.  You should self quarantine until your test result is back and is negative.    Go to the emergency department if you develop high fever, shortness of breath, severe diarrhea, or other concerning symptoms.

## 2018-12-06 NOTE — ED Provider Notes (Signed)
[name]    CSN: [id number] Arrival date & time: 12/06/18  1329      History   Chief Complaint Chief Complaint  Patient presents with  . Cough  . Nasal Congestion  . Shortness of Breath    HPI Evelyn Shepherd is a 58 y.o. female.   Patient presents with a 2-week history of a cough productive of yellow/green/brown phlegm, shortness of breath, fatigue, rhinorrhea, congestion, chills, and frontal headache.  She denies fever, rash, vomiting, diarrhea, or other symptoms.  She has attempted treatment at home with Tussionex and NyQuil/DayQuil with little relief.  The history is provided by the patient.    Past Medical History:  Diagnosis Date  . History of depression   . History of gastroesophageal reflux (GERD)   . History of hyperlipidemia   . History of migraine   . History of obesity    lap band surgery  . History of pneumonia 2005    Patient Active Problem List   Diagnosis Date Noted  . Urinary frequency 11/23/2018  . Screening mammogram, encounter for 11/23/2018  . B12 deficiency 11/23/2018  . Vertigo 07/17/2018  . Right ankle pain 10/17/2017  . History of shingles 01/22/2016  . Bursitis of left hip 01/29/2015  . Low back pain 12/26/2013  . Brachioradial pruritus 11/07/2013  . Fatigue 11/07/2013  . Encounter for routine gynecological examination 04/11/2013  . Plantar fasciitis 04/11/2013  . Colon cancer screening 02/13/2013  . Screening for breast cancer 04/13/2011  . Special screening for malignant neoplasms, colon 04/13/2011  . Seborrheic dermatitis 04/13/2011  . Routine general medical examination at a health care facility 03/25/2011  . AMENORRHEA 09/18/2008  . Hypothyroidism 08/11/2007  . LOW BACK PAIN, CHRONIC 07/14/2006  . Prediabetes 07/06/2006  . Hyperlipidemia 07/06/2006  . Depression, recurrent ([place]) 07/06/2006  . Migraine without aura 07/06/2006  . GERD 07/06/2006    Past Surgical History:  Procedure Laterality Date  .  AUGMENTATION MAMMAPLASTY Bilateral 1991  . BREAST ENHANCEMENT SURGERY    . CESAREAN SECTION    . DENTAL SURGERY    . LAPAROSCOPIC GASTRIC BANDING  07/31/07  . LUMBAR DISC SURGERY    . TONGUE SURGERY  03/2005   lesion removal    OB History   No obstetric history on file.      Home Medications    Prior to Admission medications   Medication Sig Start Date End Date Taking? Authorizing Provider  atorvastatin (LIPITOR) 10 MG tablet Take 1 tablet (10 mg total) by mouth daily. 11/28/18   Tower, Wynelle Fanny, MD  azithromycin (ZITHROMAX) 250 MG tablet Take 1 tablet (250 mg total) by mouth daily. Take first 2 tablets together, then 1 every day until finished. 12/06/18   Sharion Balloon, NP  benzonatate (TESSALON) 100 MG capsule Take 1 capsule (100 mg total) by mouth 3 (three) times daily as needed for cough. 12/06/18   Sharion Balloon, NP  diclofenac (VOLTAREN) 75 MG EC tablet Take 1 tablet (75 mg total) by mouth 2 (two) times daily. 11/08/18   Hyatt, Max T, DPM  escitalopram (LEXAPRO) 20 MG tablet Take 1 tablet (20 mg total) by mouth daily. 11/23/18   Tower, Wynelle Fanny, MD  fluticasone (FLONASE) 50 MCG/ACT nasal spray PLACE 1 SPRAY INTO BOTH NOSTRILS 2 (TWO) TIMES DAILY. Patient taking differently: Place 1 spray into both nostrils 2 (two) times daily as needed.  04/22/17   Pleas Koch, NP  ketoconazole (NIZORAL) 2 % cream APPLY TO AREAS  OF FACE THAT ARE AFFECTED ONCE DAILY 01/26/18   Tower, Wynelle Fanny, MD  meclizine (ANTIVERT) 25 MG tablet Take 1 tablet (25 mg total) by mouth 3 (three) times daily as needed for dizziness or nausea. Caution of sedation 07/17/18   Tower, Wynelle Fanny, MD  metaxalone (SKELAXIN) 800 MG tablet TAKE 1 TABLET BY MOUTH 3 TIMES DAILY AS NEEDED FOR BACK PAIN 05/11/18   Tower, Wynelle Fanny, MD  SUMAtriptan (IMITREX) 100 MG tablet May repeat in 2 hours if headache persists or recurs. 11/23/18   Tower, Wynelle Fanny, MD    Family History Family History  Problem Relation Age of Onset  . Pneumonia  Father        died  . Lung cancer Father   . Kidney cancer Mother   . Diabetes Mother   . Depression Brother        commited suicide  . Alcohol abuse Brother   . Early menopause Other        family  . Breast cancer Paternal Aunt   . Breast cancer Paternal Aunt   . Colon cancer Neg Hx   . Pancreatic cancer Neg Hx   . Rectal cancer Neg Hx   . Stomach cancer Neg Hx     Social History Social History   Tobacco Use  . Smoking status: Former Smoker    Types: Cigarettes    Quit date: 09/25/2017    Years since quitting: 1.1  . Smokeless tobacco: Never Used  Substance Use Topics  . Alcohol use: Yes    Alcohol/week: 0.0 standard drinks    Comment: occasional on monthly basis  . Drug use: No     Allergies   Meloxicam and Sulfonamide derivatives   Review of Systems Review of Systems  Constitutional: Positive for chills and fatigue. Negative for fever.  HENT: Positive for congestion and rhinorrhea. Negative for ear pain and sore throat.   Eyes: Negative for pain and visual disturbance.  Respiratory: Positive for cough and shortness of breath.   Cardiovascular: Negative for chest pain and palpitations.  Gastrointestinal: Negative for abdominal pain, diarrhea, nausea and vomiting.  Genitourinary: Negative for dysuria and hematuria.  Musculoskeletal: Negative for arthralgias and back pain.  Skin: Negative for color change and rash.  Neurological: Positive for headaches. Negative for dizziness, seizures, syncope, weakness and numbness.  All other systems reviewed and are negative.    Physical Exam Triage Vital Signs ED Triage Vitals  Enc Vitals Group     BP      Pulse      Resp      Temp      Temp src      SpO2      Weight      Height      Head Circumference      Peak Flow      Pain Score      Pain Loc      Pain Edu?      Excl. in [place]?    No data found.  Updated Vital Signs BP 120/72 (BP Location: Right Arm)   Pulse 83   Temp 98.2 F (36.8 C) (Oral)   Resp  19   SpO2 91% Comment: thick nailpolish present  Visual Acuity Right Eye Distance:   Left Eye Distance:   Bilateral Distance:    Right Eye Near:   Left Eye Near:    Bilateral Near:     Physical Exam Vitals signs and nursing note reviewed.  Constitutional:  General: She is not in acute distress.    Appearance: She is well-developed.  HENT:     Head: Normocephalic and atraumatic.     Right Ear: Tympanic membrane normal.     Left Ear: Tympanic membrane normal.     Nose: Congestion present.     Mouth/Throat:     Mouth: Mucous membranes are moist.     Pharynx: Oropharynx is clear.  Eyes:     Conjunctiva/sclera: Conjunctivae normal.  Neck:     Musculoskeletal: Neck supple.  Cardiovascular:     Rate and Rhythm: Normal rate and regular rhythm.     Heart sounds: No murmur.  Pulmonary:     Effort: Pulmonary effort is normal. No respiratory distress.     Breath sounds: Rhonchi present.     Comments: Scattered rhonchi throughout. Abdominal:     General: Bowel sounds are normal.     Palpations: Abdomen is soft.     Tenderness: There is no abdominal tenderness. There is no guarding or rebound.  Skin:    General: Skin is warm and dry.     Findings: No rash.  Neurological:     General: No focal deficit present.     Mental Status: She is alert and oriented to person, place, and time.     Sensory: No sensory deficit.     Motor: No weakness.     Gait: Gait normal.  Psychiatric:        Mood and Affect: Mood normal.        Behavior: Behavior normal.      UC Treatments / Results  Labs (all labs ordered are listed, but only abnormal results are displayed) Labs Reviewed  NOVEL CORONAVIRUS, NAA    EKG   Radiology No results found.  Procedures Procedures (including critical care time)  Medications Ordered in UC Medications - No data to display  Initial Impression / Assessment and Plan / UC Course  I have reviewed the triage vital signs and the nursing notes.   Pertinent labs & imaging results that were available during my care of the patient were reviewed by me and considered in my medical decision making (see chart for details).    Cough, suspected Covid.  Chest x-ray negative.  Treating with Zithromax and Tessalon Perles.  COVID test performed here.  Instructed patient to self quarantine until the test result is back.  Instructed patient to go to the emergency department if she develops high fever, shortness of breath, severe diarrhea, or other concerning symptoms.  Patient agrees with plan of care.       Final Clinical Impressions(s) / UC Diagnoses   Final diagnoses:  Cough  Suspected COVID-19 virus infection     Discharge Instructions     Go to [facility] for your chest x-ray.  I will call you with the results this afternoon.  Take the antibiotic as directed.    Your COVID test is pending.  You should self quarantine until your test result is back and is negative.    Go to the emergency department if you develop high fever, shortness of breath, severe diarrhea, or other concerning symptoms.       ED Prescriptions    Medication Sig Dispense Auth. Provider   azithromycin (ZITHROMAX) 250 MG tablet Take 1 tablet (250 mg total) by mouth daily. Take first 2 tablets together, then 1 every day until finished. 6 tablet Sharion Balloon, NP   benzonatate (TESSALON) 100 MG capsule Take  1 capsule (100 mg total) by mouth 3 (three) times daily as needed for cough. 21 capsule Sharion Balloon, NP     PDMP not reviewed this encounter.   Sharion Balloon, NP 12/06/18 1453

## 2018-12-08 LAB — NOVEL CORONAVIRUS, NAA: SARS-CoV-2, NAA: NOT DETECTED

## 2018-12-12 ENCOUNTER — Other Ambulatory Visit: Payer: Self-pay

## 2018-12-12 ENCOUNTER — Ambulatory Visit (INDEPENDENT_AMBULATORY_CARE_PROVIDER_SITE_OTHER): Payer: 59 | Admitting: *Deleted

## 2018-12-12 DIAGNOSIS — E538 Deficiency of other specified B group vitamins: Secondary | ICD-10-CM | POA: Diagnosis not present

## 2018-12-12 MED ORDER — CYANOCOBALAMIN 1000 MCG/ML IJ SOLN
1000.0000 ug | Freq: Once | INTRAMUSCULAR | Status: AC
  Administered 2018-12-12: 1000 ug via INTRAMUSCULAR

## 2018-12-12 NOTE — Progress Notes (Signed)
Per orders of Dr. Tower, injection of B12 given by Camylle Whicker M. °Patient tolerated injection well.  °

## 2018-12-19 ENCOUNTER — Other Ambulatory Visit: Payer: Self-pay

## 2018-12-19 ENCOUNTER — Ambulatory Visit (INDEPENDENT_AMBULATORY_CARE_PROVIDER_SITE_OTHER): Payer: 59

## 2018-12-19 DIAGNOSIS — E538 Deficiency of other specified B group vitamins: Secondary | ICD-10-CM | POA: Diagnosis not present

## 2018-12-19 MED ORDER — CYANOCOBALAMIN 1000 MCG/ML IJ SOLN
1000.0000 ug | Freq: Once | INTRAMUSCULAR | Status: AC
  Administered 2018-12-19: 1000 ug via INTRAMUSCULAR

## 2018-12-19 NOTE — Progress Notes (Signed)
Per orders of Dr. Glori Bickers, injection of B12 injection-#4 of 4 weekly shots given by Kris Mouton. Patient tolerated injection well.  Patient was advised to call and set up monthly injections.

## 2018-12-20 ENCOUNTER — Ambulatory Visit: Payer: 59 | Admitting: Podiatry

## 2019-01-24 ENCOUNTER — Ambulatory Visit: Payer: 59

## 2019-01-24 ENCOUNTER — Other Ambulatory Visit: Payer: Self-pay

## 2019-01-31 ENCOUNTER — Other Ambulatory Visit: Payer: Self-pay

## 2019-01-31 ENCOUNTER — Ambulatory Visit
Admission: RE | Admit: 2019-01-31 | Discharge: 2019-01-31 | Disposition: A | Discharge status: Home / Self Care | Payer: 59 | Source: Ambulatory Visit | Attending: Family Medicine | Admitting: Family Medicine

## 2019-01-31 DIAGNOSIS — Z1231 Encounter for screening mammogram for malignant neoplasm of breast: Secondary | ICD-10-CM

## 2019-02-08 ENCOUNTER — Other Ambulatory Visit: Payer: Self-pay

## 2019-02-08 ENCOUNTER — Ambulatory Visit (INDEPENDENT_AMBULATORY_CARE_PROVIDER_SITE_OTHER): Payer: 59

## 2019-02-08 DIAGNOSIS — E538 Deficiency of other specified B group vitamins: Secondary | ICD-10-CM

## 2019-02-08 DIAGNOSIS — Z23 Encounter for immunization: Secondary | ICD-10-CM | POA: Diagnosis not present

## 2019-02-08 MED ORDER — CYANOCOBALAMIN 1000 MCG/ML IJ SOLN
1000.0000 ug | Freq: Once | INTRAMUSCULAR | Status: AC
  Administered 2019-02-08: 1000 ug via INTRAMUSCULAR

## 2019-02-08 NOTE — Progress Notes (Signed)
Per orders of Dr. Tower, injection of vit B12 given by Tysheena Ginzburg. Patient tolerated injection well.  

## 2019-02-27 ENCOUNTER — Telehealth: Payer: Self-pay | Admitting: Family Medicine

## 2019-02-27 DIAGNOSIS — E7800 Pure hypercholesterolemia, unspecified: Secondary | ICD-10-CM

## 2019-02-27 DIAGNOSIS — E538 Deficiency of other specified B group vitamins: Secondary | ICD-10-CM

## 2019-02-27 NOTE — Telephone Encounter (Signed)
-----   Message from Cloyd Stagers, RT sent at 02/15/2019  2:10 PM EST ----- Regarding: Lab Orders for Wednesday 2.17.2021 Please place lab orders for Wednesday 2.17.2021, appt notes state "recheck B12" Thank you, Dyke Maes RT(R)

## 2019-02-28 ENCOUNTER — Other Ambulatory Visit: Payer: Self-pay

## 2019-02-28 ENCOUNTER — Other Ambulatory Visit (INDEPENDENT_AMBULATORY_CARE_PROVIDER_SITE_OTHER): Payer: 59

## 2019-02-28 DIAGNOSIS — E538 Deficiency of other specified B group vitamins: Secondary | ICD-10-CM

## 2019-02-28 DIAGNOSIS — E7800 Pure hypercholesterolemia, unspecified: Secondary | ICD-10-CM | POA: Diagnosis not present

## 2019-02-28 LAB — VITAMIN B12: Vitamin B-12: 220 pg/mL (ref 211–911)

## 2019-02-28 LAB — LIPID PANEL
Cholesterol: 173 mg/dL (ref 0–200)
HDL: 51 mg/dL (ref >39.00)
LDL Cholesterol: 92 mg/dL (ref 0–99)
NonHDL: 122.43
Total CHOL/HDL Ratio: 3
Triglycerides: 153 mg/dL — ABNORMAL HIGH (ref 0.0–149.0)
VLDL: 30.6 mg/dL (ref 0.0–40.0)

## 2019-02-28 LAB — AST: AST: 15 U/L (ref 0–37)

## 2019-02-28 LAB — ALT: ALT: 15 U/L (ref 0–35)

## 2019-03-15 ENCOUNTER — Ambulatory Visit (INDEPENDENT_AMBULATORY_CARE_PROVIDER_SITE_OTHER): Payer: 59

## 2019-03-15 ENCOUNTER — Other Ambulatory Visit: Payer: Self-pay

## 2019-03-15 DIAGNOSIS — E538 Deficiency of other specified B group vitamins: Secondary | ICD-10-CM

## 2019-03-15 MED ORDER — CYANOCOBALAMIN 1000 MCG/ML IJ SOLN
1000.0000 ug | Freq: Once | INTRAMUSCULAR | Status: AC
  Administered 2019-03-15: 1000 ug via INTRAMUSCULAR

## 2019-03-15 NOTE — Progress Notes (Signed)
Per orders of Dr.Tower, injection of B12 given by Pilar Grammes, CMA in Right Deltoid. Patient tolerated injection well.  Sending to Dr Damita Dunnings in Dr Marliss Coots absence this afternoon.

## 2019-05-01 ENCOUNTER — Other Ambulatory Visit: Payer: Self-pay

## 2019-05-01 ENCOUNTER — Encounter: Payer: Self-pay | Admitting: Family Medicine

## 2019-05-01 ENCOUNTER — Ambulatory Visit: Payer: 59 | Admitting: Family Medicine

## 2019-05-01 VITALS — BP 128/72 | HR 91 | Temp 97.8°F | Ht 65.25 in | Wt 227.0 lb

## 2019-05-01 DIAGNOSIS — Z9884 Bariatric surgery status: Secondary | ICD-10-CM

## 2019-05-01 DIAGNOSIS — R42 Dizziness and giddiness: Secondary | ICD-10-CM

## 2019-05-01 DIAGNOSIS — M25511 Pain in right shoulder: Secondary | ICD-10-CM | POA: Diagnosis not present

## 2019-05-01 DIAGNOSIS — G8929 Other chronic pain: Secondary | ICD-10-CM

## 2019-05-01 DIAGNOSIS — R252 Cramp and spasm: Secondary | ICD-10-CM | POA: Diagnosis not present

## 2019-05-01 DIAGNOSIS — M25559 Pain in unspecified hip: Secondary | ICD-10-CM | POA: Insufficient documentation

## 2019-05-01 DIAGNOSIS — F339 Major depressive disorder, recurrent, unspecified: Secondary | ICD-10-CM

## 2019-05-01 DIAGNOSIS — M25552 Pain in left hip: Secondary | ICD-10-CM | POA: Diagnosis not present

## 2019-05-01 LAB — COMPREHENSIVE METABOLIC PANEL
ALT: 15 U/L (ref 0–35)
AST: 15 U/L (ref 0–37)
Albumin: 3.6 g/dL (ref 3.5–5.2)
Alkaline Phosphatase: 53 U/L (ref 39–117)
BUN: 18 mg/dL (ref 6–23)
CO2: 29 mEq/L (ref 19–32)
Calcium: 8.5 mg/dL (ref 8.4–10.5)
Chloride: 106 mEq/L (ref 96–112)
Creatinine, Ser: 0.82 mg/dL (ref 0.40–1.20)
GFR: 71.45 mL/min (ref >60.00)
Glucose, Bld: 114 mg/dL — ABNORMAL HIGH (ref 70–99)
Potassium: 4.4 mEq/L (ref 3.5–5.1)
Sodium: 138 mEq/L (ref 135–145)
Total Bilirubin: 0.5 mg/dL (ref 0.2–1.2)
Total Protein: 5.3 g/dL — ABNORMAL LOW (ref 6.0–8.3)

## 2019-05-01 LAB — CBC WITH DIFFERENTIAL/PLATELET
Basophils Absolute: 0.1 10*3/uL (ref 0.0–0.1)
Basophils Relative: 0.8 % (ref 0.0–3.0)
Eosinophils Absolute: 0.1 10*3/uL (ref 0.0–0.7)
Eosinophils Relative: 1.3 % (ref 0.0–5.0)
HCT: 40.8 % (ref 36.0–46.0)
Hemoglobin: 13.8 g/dL (ref 12.0–15.0)
Lymphocytes Relative: 25.1 % (ref 12.0–46.0)
Lymphs Abs: 2.2 10*3/uL (ref 0.7–4.0)
MCHC: 33.8 g/dL (ref 30.0–36.0)
MCV: 88.7 fl (ref 78.0–100.0)
Monocytes Absolute: 0.4 10*3/uL (ref 0.1–1.0)
Monocytes Relative: 4.4 % (ref 3.0–12.0)
Neutro Abs: 6.1 10*3/uL (ref 1.4–7.7)
Neutrophils Relative %: 68.4 % (ref 43.0–77.0)
Platelets: 201 10*3/uL (ref 150.0–400.0)
RBC: 4.6 Mil/uL (ref 3.87–5.11)
RDW: 13.9 % (ref 11.5–15.5)
WBC: 8.9 10*3/uL (ref 4.0–10.5)

## 2019-05-01 LAB — MAGNESIUM: Magnesium: 1.9 mg/dL (ref 1.5–2.5)

## 2019-05-01 LAB — VITAMIN B12: Vitamin B-12: 400 pg/mL (ref 211–911)

## 2019-05-01 LAB — TSH: TSH: 1.84 u[IU]/mL (ref 0.35–4.50)

## 2019-05-01 LAB — VITAMIN D 25 HYDROXY (VIT D DEFICIENCY, FRACTURES): VITD: 14.09 ng/mL — ABNORMAL LOW (ref 30.00–100.00)

## 2019-05-01 MED ORDER — MECLIZINE HCL 25 MG PO TABS: 25.0000 mg | ORAL_TABLET | Freq: Three times a day (TID) | ORAL | 2 refills | Status: DC | PRN

## 2019-05-01 NOTE — Patient Instructions (Addendum)
Look into a beginner yoga program (DVD or streaming)   Labs today for cramping   Regular exercise may help a lot of your symptoms   I want to refer you to orthopedics for shoulder and hip pain   For cramps -get magnesium 250 to 500 mg and take one daily in the evening and see if this starts to help cramps

## 2019-05-01 NOTE — Assessment & Plan Note (Signed)
Suspect bursitis Recommend stretching/yoga Requested orthopedic referral

## 2019-05-01 NOTE — Assessment & Plan Note (Signed)
In pt with h/o bariatric surgery  Lab today for B12, chem profile, cbc, magnesium and vit D   Recommend trial of magnesium 250 to 500 mg daily for cramps (as tolerated) Also stretching (may consider yoga)

## 2019-05-01 NOTE — Assessment & Plan Note (Signed)
Intermittent-may be worse during allergy season  Had a few bad days-better now  Likes to have meclizine on hand  inst to let us know if no improvement or if symptoms become more frequent or persistent

## 2019-05-01 NOTE — Progress Notes (Signed)
Subjective:    Patient ID: Evelyn Shepherd, female    DOB: 1960/02/05, 59 y.o.   MRN: [id number] This visit occurred during the SARS-CoV-2 public health emergency.  Safety protocols were in place, including screening questions prior to the visit, additional usage of staff PPE, and extensive cleaning of exam room while observing appropriate contact time as indicated for disinfecting solutions.    HPI Pt presents with hand symptoms and dizziness  Wt Readings from Last 3 Encounters:  05/01/19 227 lb (103 kg)  11/23/18 217 lb (98.4 kg)  09/25/18 220 lb (99.8 kg)   37.49 kg/m   Has a h/o mild positional vertigo in the past  Meclizine helps  Worse for the past week- spinning No falls or head injuries    Her hands are "locking and cramping"  Also cramping of forearms and legs and toes  Can be during activity or rest  Most of the time- 2nd half of the day  Does wake up with cramps at night   Sleep is fair - sometimes good and sometimes bad  No new medicines   Takes vit B12  No other supplements  Does not take vit D or calcium   She has pain in the joints of her hands  2 index fingers are knotty  No triggering of fingers   occ gets a red /hot joint on L hand -lasts a day  Middle pips mainly    Not exercising  She would enjoy walking and stretching Too lazy  Unsure if she can motivate   No diet changes  No diuretic medicine   No auto immune problems in family    Diet is balanced but fruit does not go down as well  Eats salads/chicken and some beef  Avoids pork in general   R shoulder and L hip pain  Chronic and interfere with sleep   Patient Active Problem List   Diagnosis Date Noted  . Right shoulder pain 05/01/2019  . Left hip pain 05/01/2019  . Muscle cramping 05/01/2019  . Bariatric surgery status 05/01/2019  . Urinary frequency 11/23/2018  . Screening mammogram, encounter for 11/23/2018  . B12 deficiency 11/23/2018  . Vertigo 07/17/2018  . Right  ankle pain 10/17/2017  . History of shingles 01/22/2016  . Bursitis of left hip 01/29/2015  . Low back pain 12/26/2013  . Brachioradial pruritus 11/07/2013  . Fatigue 11/07/2013  . Encounter for routine gynecological examination 04/11/2013  . Plantar fasciitis 04/11/2013  . Colon cancer screening 02/13/2013  . Screening for breast cancer 04/13/2011  . Special screening for malignant neoplasms, colon 04/13/2011  . Seborrheic dermatitis 04/13/2011  . Routine general medical examination at a health care facility 03/25/2011  . AMENORRHEA 09/18/2008  . Hypothyroidism 08/11/2007  . LOW BACK PAIN, CHRONIC 07/14/2006  . Prediabetes 07/06/2006  . Hyperlipidemia 07/06/2006  . Depression, recurrent ([place]) 07/06/2006  . Migraine without aura 07/06/2006  . GERD 07/06/2006   Past Medical History:  Diagnosis Date  . History of depression   . History of gastroesophageal reflux (GERD)   . History of hyperlipidemia   . History of migraine   . History of obesity    lap band surgery  . History of pneumonia 2005   Past Surgical History:  Procedure Laterality Date  . AUGMENTATION MAMMAPLASTY Bilateral 1991  . BREAST ENHANCEMENT SURGERY    . CESAREAN SECTION    . DENTAL SURGERY    . LAPAROSCOPIC GASTRIC BANDING  07/31/07  . LUMBAR DISC SURGERY    .  TONGUE SURGERY  03/2005   lesion removal   Social History   Tobacco Use  . Smoking status: Former Smoker    Types: Cigarettes    Quit date: 09/25/2017    Years since quitting: 1.5  . Smokeless tobacco: Never Used  Substance Use Topics  . Alcohol use: Yes    Alcohol/week: 0.0 standard drinks    Comment: occasional on monthly basis  . Drug use: No   Family History  Problem Relation Age of Onset  . Pneumonia Father        died  . Lung cancer Father   . Kidney cancer Mother   . Diabetes Mother   . Depression Brother        commited suicide  . Alcohol abuse Brother   . Early menopause Other        family  . Breast cancer Paternal  Aunt   . Breast cancer Paternal Aunt   . Colon cancer Neg Hx   . Pancreatic cancer Neg Hx   . Rectal cancer Neg Hx   . Stomach cancer Neg Hx    Allergies  Allergen Reactions  . Meloxicam     REACTION: unspecified  . Sulfonamide Derivatives     REACTION: unspecified   Current Outpatient Medications on File Prior to Visit  Medication Sig Dispense Refill  . atorvastatin (LIPITOR) 10 MG tablet Take 1 tablet (10 mg total) by mouth daily. 90 tablet 3  . diclofenac (VOLTAREN) 75 MG EC tablet Take 1 tablet (75 mg total) by mouth 2 (two) times daily. 60 tablet 3  . escitalopram (LEXAPRO) 20 MG tablet Take 1 tablet (20 mg total) by mouth daily. 90 tablet 3  . fluticasone (FLONASE) 50 MCG/ACT nasal spray PLACE 1 SPRAY INTO BOTH NOSTRILS 2 (TWO) TIMES DAILY. (Patient taking differently: Place 1 spray into both nostrils 2 (two) times daily as needed. ) 16 g 2  . ketoconazole (NIZORAL) 2 % cream APPLY TO AREAS OF FACE THAT ARE AFFECTED ONCE DAILY 60 g 0  . metaxalone (SKELAXIN) 800 MG tablet TAKE 1 TABLET BY MOUTH 3 TIMES DAILY AS NEEDED FOR BACK PAIN 30 tablet 0  . SUMAtriptan (IMITREX) 100 MG tablet May repeat in 2 hours if headache persists or recurs. 27 tablet 3   No current facility-administered medications on file prior to visit.     Review of Systems  Constitutional: Positive for fatigue. Negative for activity change, appetite change, fever and unexpected weight change.  HENT: Negative for congestion, ear pain, rhinorrhea, sinus pressure and sore throat.   Eyes: Negative for pain, redness and visual disturbance.  Respiratory: Negative for cough, shortness of breath and wheezing.   Cardiovascular: Negative for chest pain and palpitations.  Gastrointestinal: Negative for abdominal pain, blood in stool, constipation and diarrhea.  Endocrine: Negative for polydipsia and polyuria.  Genitourinary: Negative for dysuria, frequency and urgency.  Musculoskeletal: Positive for arthralgias, back  pain and myalgias. Negative for joint swelling.  Skin: Negative for pallor and rash.  Allergic/Immunologic: Negative for environmental allergies.  Neurological: Positive for dizziness. Negative for syncope, facial asymmetry, light-headedness and headaches.  Hematological: Negative for adenopathy. Does not bruise/bleed easily.  Psychiatric/Behavioral: Positive for sleep disturbance. Negative for decreased concentration and dysphoric mood. The patient is not nervous/anxious.        Decreased motivation        Objective:   Physical Exam Constitutional:      General: She is not in acute distress.    Appearance: Normal appearance.  She is well-developed. She is obese. She is not ill-appearing or diaphoretic.  HENT:     Head: Normocephalic and atraumatic.     Mouth/Throat:     Mouth: Mucous membranes are moist.  Eyes:     General: No scleral icterus.    Conjunctiva/sclera: Conjunctivae normal.     Pupils: Pupils are equal, round, and reactive to light.  Cardiovascular:     Rate and Rhythm: Regular rhythm. Tachycardia present.  Pulmonary:     Effort: Pulmonary effort is normal. No respiratory distress.     Breath sounds: Normal breath sounds. No wheezing or rales.  Abdominal:     General: Bowel sounds are normal. There is no distension.     Palpations: Abdomen is soft.     Tenderness: There is no abdominal tenderness.  Musculoskeletal:        General: Tenderness present.     Cervical back: Normal range of motion and neck supple.     Lumbar back: Spasms and tenderness present. No edema or bony tenderness. Decreased range of motion.     Right lower leg: No edema.     Left lower leg: No edema.     Comments: Mild OA changes in hands (index fingers) without tenderness or triggering Reduced rom of R shoulder  Tender L greater trochanter  No myofascial tenderness No muscle cramps during visit  Lymphadenopathy:     Cervical: No cervical adenopathy.  Skin:    General: Skin is warm and  dry.     Coloration: Skin is not pale.     Findings: No erythema or rash.     Comments: tanned  Neurological:     General: No focal deficit present.     Mental Status: She is alert.     Cranial Nerves: No cranial nerve deficit.     Sensory: No sensory deficit.     Motor: No atrophy or abnormal muscle tone.     Coordination: Coordination normal.     Deep Tendon Reflexes: Reflexes are normal and symmetric. Reflexes normal.     Comments: Negative SLR  Psychiatric:        Attention and Perception: Attention normal.        Mood and Affect: Mood normal.        Speech: Speech normal.     Comments: Pt voices low motivation  Pleasant/talkative and cheerful today           Assessment & Plan:   Problem List Items Addressed This Visit      Other   Depression, recurrent ([place])    Continues lexapro Reviewed stressors/ coping techniques/symptoms/ support sources/ tx options and side effects in detail today Lack of motivation and energy lately  Strongly enc regular exercise as well as self care      Vertigo    Intermittent-may be worse during allergy season  Had a few bad days-better now  Likes to have meclizine on hand  inst to let us know if no improvement or if symptoms become more frequent or persistent       Right shoulder pain    Ongoing and interfering with sleep Pt req orthopedic ref      Relevant Orders   Ambulatory referral to Orthopedic Surgery   Left hip pain    Suspect bursitis Recommend stretching/yoga Requested orthopedic referral      Relevant Orders   Ambulatory referral to Orthopedic Surgery   Muscle cramping - Primary    In pt with h/o bariatric surgery  Lab today for B12, chem profile, cbc, magnesium and vit D   Recommend trial of magnesium 250 to 500 mg daily for cramps (as tolerated) Also stretching (may consider yoga)      Relevant Orders   Comprehensive metabolic panel (Completed)   CBC with Differential/Platelet (Completed)   TSH  (Completed)   VITAMIN D 25 Hydroxy (Vit-D Deficiency, Fractures) (Completed)   Magnesium (Completed)   Vitamin B12 (Completed)   Bariatric surgery status    Pt suffers from a lot of muscle cramps  ? If she has absorption issues  Today checking B12 and vit D and cbc and chem profile Diet is balanced Does not exercise  Unfortunately is gaining weight back  Has lost some motivation -enc her to move more       Relevant Orders   VITAMIN D 25 Hydroxy (Vit-D Deficiency, Fractures) (Completed)   Magnesium (Completed)   Vitamin B12 (Completed)

## 2019-05-01 NOTE — Assessment & Plan Note (Signed)
Continues lexapro Reviewed stressors/ coping techniques/symptoms/ support sources/ tx options and side effects in detail today Lack of motivation and energy lately  Strongly enc regular exercise as well as self care

## 2019-05-01 NOTE — Assessment & Plan Note (Addendum)
Pt suffers from a lot of muscle cramps  ? If she has absorption issues  Today checking B12 and vit D and cbc and chem profile Diet is balanced Does not exercise  Unfortunately is gaining weight back  Has lost some motivation -enc her to move more

## 2019-05-01 NOTE — Assessment & Plan Note (Signed)
Ongoing and interfering with sleep Pt req orthopedic ref

## 2019-05-02 ENCOUNTER — Telehealth: Payer: Self-pay | Admitting: *Deleted

## 2019-05-02 MED ORDER — VITAMIN D (ERGOCALCIFEROL) 1.25 MG (50000 UNIT) PO CAPS: 50000.0000 [IU] | ORAL_CAPSULE | ORAL | 0 refills | Status: DC

## 2019-05-02 NOTE — Telephone Encounter (Signed)
Pt notified of lab results and she viewed them on mychart also. Pt will take D3 and Rx D and keep [place] posted

## 2019-05-02 NOTE — Telephone Encounter (Signed)
-----   Message from Abner Greenspan, MD sent at 05/01/2019  7:11 PM EDT ----- Vitamin D is low-this may add to muscle irritability  Please send in ergocalciferol 50,000 iu 1 po q week for 12 weeks (#12 no refills)  In addition to this please take 2000 iu vit D3 otc daily  B12 is ok  Magnesium is nl but I still want her to start the otc extra magnesium to see if it helps with her cramping

## 2019-05-03 ENCOUNTER — Ambulatory Visit (INDEPENDENT_AMBULATORY_CARE_PROVIDER_SITE_OTHER): Payer: 59

## 2019-05-03 DIAGNOSIS — E538 Deficiency of other specified B group vitamins: Secondary | ICD-10-CM | POA: Diagnosis not present

## 2019-05-03 MED ORDER — CYANOCOBALAMIN 1000 MCG/ML IJ SOLN
1000.0000 ug | Freq: Once | INTRAMUSCULAR | Status: AC
  Administered 2019-05-03: 15:00:00 1000 ug via INTRAMUSCULAR

## 2019-05-03 NOTE — Progress Notes (Signed)
Per orders of NP, Regina Baity, injection of vit B12 given by Clifford Coudriet. Patient tolerated injection well.  

## 2019-05-15 ENCOUNTER — Encounter: Payer: Self-pay | Admitting: Orthopaedic Surgery

## 2019-05-15 ENCOUNTER — Ambulatory Visit: Payer: 59 | Admitting: Orthopaedic Surgery

## 2019-05-15 ENCOUNTER — Other Ambulatory Visit: Payer: Self-pay

## 2019-05-15 ENCOUNTER — Ambulatory Visit: Payer: Self-pay

## 2019-05-15 DIAGNOSIS — M25552 Pain in left hip: Secondary | ICD-10-CM

## 2019-05-15 DIAGNOSIS — M25511 Pain in right shoulder: Secondary | ICD-10-CM

## 2019-05-15 DIAGNOSIS — G8929 Other chronic pain: Secondary | ICD-10-CM | POA: Diagnosis not present

## 2019-05-15 MED ORDER — LIDOCAINE HCL 1 % IJ SOLN
3.0000 mL | INTRAMUSCULAR | Status: AC | PRN
  Administered 2019-05-15: 3 mL

## 2019-05-15 MED ORDER — METHYLPREDNISOLONE ACETATE 40 MG/ML IJ SUSP
40.0000 mg | INTRAMUSCULAR | Status: AC | PRN
  Administered 2019-05-15: 40 mg via INTRA_ARTICULAR

## 2019-05-15 MED ORDER — BUPIVACAINE HCL 0.5 % IJ SOLN
3.0000 mL | INTRAMUSCULAR | Status: AC | PRN
  Administered 2019-05-15: 3 mL via INTRA_ARTICULAR

## 2019-05-15 NOTE — Progress Notes (Signed)
Subjective: She is here for ultrasound-guided right shoulder long head biceps tendon injection.  Objective: Point tender over the biceps tendon.  Procedure: Ultrasound-guided right shoulder injection: After sterile prep with Betadine, injected 4 cc 1% lidocaine without epinephrine and 40 mg methylprednisolone into the biceps tendon sheath from transverse approach using ultrasound to guide needle placement.  Good immediate relief.

## 2019-05-15 NOTE — Progress Notes (Signed)
Office Visit Note   Patient: Evelyn Shepherd           Date of Birth: 1960-07-14           MRN: [id number] Visit Date: 05/15/2019              Requested by: Tower, Wynelle Fanny, MD [place],  [place] [address] PCP: Abner Greenspan, MD   Assessment & Plan: Visit Diagnoses:  1. Chronic right shoulder pain   2. Pain in left hip     Plan: Impression is right shoulder pain biceps tendinosis and left lateral hip pain likely trochanteric bursitis versus abductor tendinopathy.  We reviewed the conditions in detail and based on discussion of treatment options patient is agreeable to proceed with cortisone injections for each.  I performed the left trochanteric bursal injection and Dr. Junius Roads performed the ultrasound-guided biceps tendon injection.  Home exercises were also provided for the left hip.  Questions encouraged and answered.  Follow-up as needed.  Follow-Up Instructions: Return if symptoms worsen or fail to improve.   Orders:  Orders Placed This Encounter  Procedures  . XR HIP UNILAT W OR W/O PELVIS 2-3 VIEWS LEFT  . XR Shoulder Right   No orders of the defined types were placed in this encounter.     Procedures: Large Joint Inj: L greater trochanter on 05/15/2019 9:52 AM Indications: pain Details: 22 G needle Medications: 3 mL lidocaine 1 %; 3 mL bupivacaine 0.5 %; 40 mg methylPREDNISolone acetate 40 MG/ML Outcome: tolerated well, no immediate complications Patient was prepped and draped in the usual sterile fashion.       Clinical Data: No additional findings.   Subjective: Chief Complaint  Patient presents with  . Right Shoulder - Pain  . Left Hip - Pain    Evelyn Shepherd is a pleasant 59 year old female who comes in for evaluation of chronic right shoulder pain and left hip pain.  For the right shoulder she reports no definite injuries.  She has anterior shoulder pain that has been bothersome for about a year.  Aleve helps temporarily.  She denies any  radicular symptoms.  Denies any neck pain.  For her left hip this has been going on for several months.  Denies any injuries.  The pain is localized on the lateral side without any radiation.  Denies any groin pain.  Denies any back pain.  The pain is worse with side sleeping.   Review of Systems  Constitutional: Negative.   HENT: Negative.   Eyes: Negative.   Respiratory: Negative.   Cardiovascular: Negative.   Endocrine: Negative.   Musculoskeletal: Negative.   Neurological: Negative.   Hematological: Negative.   Psychiatric/Behavioral: Negative.   All other systems reviewed and are negative.    Objective: Vital Signs: There were no vitals taken for this visit.  Physical Exam Vitals and nursing note reviewed.  Constitutional:      Appearance: She is well-developed.  HENT:     Head: Normocephalic and atraumatic.  Pulmonary:     Effort: Pulmonary effort is normal.  Abdominal:     Palpations: Abdomen is soft.  Musculoskeletal:     Cervical back: Neck supple.  Skin:    General: Skin is warm.     Capillary Refill: Capillary refill takes less than 2 seconds.  Neurological:     Mental Status: She is alert and oriented to person, place, and time.  Psychiatric:        Behavior: Behavior  normal.        Thought Content: Thought content normal.        Judgment: Judgment normal.     Ortho Exam Right shoulder shows full range of motion with mild discomfort.  She has tenderness along the biceps tendon anteriorly.  Positive Speed.  Manual muscle testing is normal.  Moderately positive Hawkins impingement.  Negative cross adduction.  Left hip shows tenderness directly over the trochanteric bursa.  She has no pain with logroll.  Negative sciatic tension signs.  Specialty Comments:  No specialty comments available.  Imaging: XR HIP UNILAT W OR W/O PELVIS 2-3 VIEWS LEFT  Result Date: 05/15/2019 Mild hip osteoarthritis.  No acute abnormalities.  XR Shoulder Right  Result Date:  05/15/2019 Mild to moderate AC joint arthropathy.  No structural or acute abnormalities.    PMFS History: Patient Active Problem List   Diagnosis Date Noted  . Right shoulder pain 05/01/2019  . Left hip pain 05/01/2019  . Muscle cramping 05/01/2019  . Bariatric surgery status 05/01/2019  . Urinary frequency 11/23/2018  . Screening mammogram, encounter for 11/23/2018  . B12 deficiency 11/23/2018  . Vertigo 07/17/2018  . Right ankle pain 10/17/2017  . History of shingles 01/22/2016  . Bursitis of left hip 01/29/2015  . Low back pain 12/26/2013  . Brachioradial pruritus 11/07/2013  . Fatigue 11/07/2013  . Encounter for routine gynecological examination 04/11/2013  . Plantar fasciitis 04/11/2013  . Colon cancer screening 02/13/2013  . Screening for breast cancer 04/13/2011  . Special screening for malignant neoplasms, colon 04/13/2011  . Seborrheic dermatitis 04/13/2011  . Routine general medical examination at a health care facility 03/25/2011  . AMENORRHEA 09/18/2008  . Hypothyroidism 08/11/2007  . LOW BACK PAIN, CHRONIC 07/14/2006  . Prediabetes 07/06/2006  . Hyperlipidemia 07/06/2006  . Depression, recurrent ([place]) 07/06/2006  . Migraine without aura 07/06/2006  . GERD 07/06/2006   Past Medical History:  Diagnosis Date  . History of depression   . History of gastroesophageal reflux (GERD)   . History of hyperlipidemia   . History of migraine   . History of obesity    lap band surgery  . History of pneumonia 2005    Family History  Problem Relation Age of Onset  . Pneumonia Father        died  . Lung cancer Father   . Kidney cancer Mother   . Diabetes Mother   . Depression Brother        commited suicide  . Alcohol abuse Brother   . Early menopause Other        family  . Breast cancer Paternal Aunt   . Breast cancer Paternal Aunt   . Colon cancer Neg Hx   . Pancreatic cancer Neg Hx   . Rectal cancer Neg Hx   . Stomach cancer Neg Hx     Past Surgical  History:  Procedure Laterality Date  . AUGMENTATION MAMMAPLASTY Bilateral 1991  . BREAST ENHANCEMENT SURGERY    . CESAREAN SECTION    . DENTAL SURGERY    . LAPAROSCOPIC GASTRIC BANDING  07/31/07  . LUMBAR DISC SURGERY    . TONGUE SURGERY  03/2005   lesion removal   Social History   Occupational History  . Not on file  Tobacco Use  . Smoking status: Former Smoker    Types: Cigarettes    Quit date: 09/25/2017    Years since quitting: 1.6  . Smokeless tobacco: Never Used  Substance and Sexual Activity  .  Alcohol use: Yes    Alcohol/week: 0.0 standard drinks    Comment: occasional on monthly basis  . Drug use: No  . Sexual activity: Yes    Partners: Female    Birth control/protection: Post-menopausal

## 2019-06-13 ENCOUNTER — Ambulatory Visit (INDEPENDENT_AMBULATORY_CARE_PROVIDER_SITE_OTHER): Payer: 59

## 2019-06-13 DIAGNOSIS — E538 Deficiency of other specified B group vitamins: Secondary | ICD-10-CM | POA: Diagnosis not present

## 2019-06-13 MED ORDER — CYANOCOBALAMIN 1000 MCG/ML IJ SOLN
1000.0000 ug | Freq: Once | INTRAMUSCULAR | Status: AC
  Administered 2019-06-13: 1000 ug via INTRAMUSCULAR

## 2019-06-13 NOTE — Progress Notes (Signed)
Per orders of Dr. Marne Tower, injection of B12 given by Maral Lampe Y.  Patient tolerated injection well.  

## 2019-07-04 ENCOUNTER — Other Ambulatory Visit: Payer: Self-pay

## 2019-07-04 ENCOUNTER — Encounter: Payer: Self-pay | Admitting: Family Medicine

## 2019-07-04 ENCOUNTER — Ambulatory Visit: Payer: 59 | Admitting: Family Medicine

## 2019-07-04 VITALS — BP 110/70 | HR 80 | Temp 98.0°F | Ht 65.25 in | Wt 230.8 lb

## 2019-07-04 DIAGNOSIS — N3090 Cystitis, unspecified without hematuria: Secondary | ICD-10-CM

## 2019-07-04 DIAGNOSIS — R8290 Unspecified abnormal findings in urine: Secondary | ICD-10-CM

## 2019-07-04 LAB — POC URINALSYSI DIPSTICK (AUTOMATED)
Bilirubin, UA: NEGATIVE
Glucose, UA: NEGATIVE
Ketones, UA: NEGATIVE
Nitrite, UA: POSITIVE
Protein, UA: POSITIVE — AB
Spec Grav, UA: 1.015 (ref 1.010–1.025)
Urobilinogen, UA: 0.2 E.U./dL
pH, UA: 6.5 (ref 5.0–8.0)

## 2019-07-04 MED ORDER — NITROFURANTOIN MONOHYD MACRO 100 MG PO CAPS: 100.0000 mg | ORAL_CAPSULE | Freq: Two times a day (BID) | ORAL | 0 refills | Status: AC

## 2019-07-04 NOTE — Progress Notes (Signed)
Evelyn Shepherd Space, MD, [place]  [facility] and [place] [facility] [name], [address]  Phone: [phone]  FAX: [place] - 59 y.o. female  MRN [id number]  Date of Birth: 1960/06/02  Date: 07/04/2019  PCP: Abner Greenspan, MD  Referral: Abner Greenspan, MD  Chief Complaint  Patient presents with  . Back Pain    abnormal urine odor and color    This visit occurred during the SARS-CoV-2 public health emergency.  Safety protocols were in place, including screening questions prior to the visit, additional usage of staff PPE, and extensive cleaning of exam room while observing appropriate contact time as indicated for disinfecting solutions.   Subjective:   This 59 y.o. female patient presents with urgency. No vaginal discharge or external irritation.  No abd pain, no flank pain. ? Early back pain  UTI symptoms:  Macroscopic blood in urine for a couple of years.  S/p cystoscopy  IF AFTER AFTER Friday, THEN [facility] [address]    Review of Systems is noted in the HPI, as appropriate  Objective:   Blood pressure 110/70, pulse 80, temperature 98 F (36.7 C), temperature source Skin, height 5' 5.25" (1.657 m), weight 230 lb 12 oz (104.7 kg).   GEN: WDWN HEENT: Atraumatc, normocephalic. ABD: S, NT, ND, +BS, no rebound. No CVAT. No suprapubic tenderness.  Objective Data: Results for orders placed or performed in visit on 07/04/19  POCT Urinalysis Dipstick (Automated)  Result Value Ref Range   Color, UA yellow    Clarity, UA cloudy    Glucose, UA Negative Negative   Bilirubin, UA negative    Ketones, UA negative    Spec Grav, UA 1.015 1.010 - 1.025   Blood, UA 2+    pH, UA 6.5 5.0 - 8.0   Protein, UA Positive (A) Negative   Urobilinogen, UA 0.2 0.2 or 1.0 E.U./dL   Nitrite, UA positive    Leukocytes, UA Small (1+) (A) Negative    Assessment and Plan:     ICD-10-CM     1. Cystitis  N30.90 Urine Culture  2. Abnormal urine odor  R82.90 POCT Urinalysis Dipstick (Automated)   Rx with ABX as below. Drink plenty of fluids and supportive care.  Follow-up: No follow-ups on file.  Meds ordered this encounter  Medications  . nitrofurantoin, macrocrystal-monohydrate, (MACROBID) 100 MG capsule    Sig: Take 1 capsule (100 mg total) by mouth 2 (two) times daily for 7 days.    Dispense:  14 capsule    Refill:  0   Orders Placed This Encounter  Procedures  . Urine Culture  . POCT Urinalysis Dipstick (Automated)    Signed,  Fount Bahe T. Yolanda Dockendorf, MD   Patient's Medications  New Prescriptions   NITROFURANTOIN, MACROCRYSTAL-MONOHYDRATE, (MACROBID) 100 MG CAPSULE    Take 1 capsule (100 mg total) by mouth 2 (two) times daily for 7 days.  Previous Medications   ATORVASTATIN (LIPITOR) 10 MG TABLET    Take 1 tablet (10 mg total) by mouth daily.   DICLOFENAC (VOLTAREN) 75 MG EC TABLET    Take 1 tablet (75 mg total) by mouth 2 (two) times daily.   ESCITALOPRAM (LEXAPRO) 20 MG TABLET    Take 1 tablet (20 mg total) by mouth daily.   FLUTICASONE (FLONASE) 50 MCG/ACT NASAL SPRAY    PLACE 1 SPRAY INTO BOTH NOSTRILS 2 (TWO) TIMES DAILY.   KETOCONAZOLE (NIZORAL)  2 % CREAM    APPLY TO AREAS OF FACE THAT ARE AFFECTED ONCE DAILY   MECLIZINE (ANTIVERT) 25 MG TABLET    Take 1 tablet (25 mg total) by mouth 3 (three) times daily as needed for dizziness or nausea. Caution of sedation   METAXALONE (SKELAXIN) 800 MG TABLET    TAKE 1 TABLET BY MOUTH 3 TIMES DAILY AS NEEDED FOR BACK PAIN   SUMATRIPTAN (IMITREX) 100 MG TABLET    May repeat in 2 hours if headache persists or recurs.   VITAMIN D, ERGOCALCIFEROL, (DRISDOL) 1.25 MG (50000 UNIT) CAPS CAPSULE    Take 1 capsule (50,000 Units total) by mouth every 7 (seven) days.  Modified Medications   No medications on file  Discontinued Medications   No medications on file

## 2019-07-06 LAB — URINE CULTURE
MICRO NUMBER:: 10625285
SPECIMEN QUALITY:: ADEQUATE

## 2019-07-17 ENCOUNTER — Ambulatory Visit (INDEPENDENT_AMBULATORY_CARE_PROVIDER_SITE_OTHER): Payer: 59

## 2019-07-17 ENCOUNTER — Other Ambulatory Visit: Payer: Self-pay

## 2019-07-17 DIAGNOSIS — E538 Deficiency of other specified B group vitamins: Secondary | ICD-10-CM | POA: Diagnosis not present

## 2019-07-17 MED ORDER — CYANOCOBALAMIN 1000 MCG/ML IJ SOLN
1000.0000 ug | Freq: Once | INTRAMUSCULAR | Status: AC
  Administered 2019-07-17: 1000 ug via INTRAMUSCULAR

## 2019-07-17 NOTE — Progress Notes (Signed)
Per orders of Dr. Tower, injection of B12 ° given by Adryen Cookson. °Patient tolerated injection well. ° °

## 2019-08-29 ENCOUNTER — Other Ambulatory Visit: Payer: Self-pay

## 2019-08-29 ENCOUNTER — Ambulatory Visit (INDEPENDENT_AMBULATORY_CARE_PROVIDER_SITE_OTHER): Payer: 59

## 2019-08-29 ENCOUNTER — Ambulatory Visit: Payer: 59 | Admitting: Family Medicine

## 2019-08-29 ENCOUNTER — Ambulatory Visit: Payer: Self-pay

## 2019-08-29 VITALS — BP 104/62 | HR 92 | Ht 65.0 in | Wt 230.0 lb

## 2019-08-29 DIAGNOSIS — M25561 Pain in right knee: Secondary | ICD-10-CM | POA: Diagnosis not present

## 2019-08-29 DIAGNOSIS — M79604 Pain in right leg: Secondary | ICD-10-CM

## 2019-08-29 DIAGNOSIS — E538 Deficiency of other specified B group vitamins: Secondary | ICD-10-CM

## 2019-08-29 DIAGNOSIS — S93401A Sprain of unspecified ligament of right ankle, initial encounter: Secondary | ICD-10-CM

## 2019-08-29 MED ORDER — CYANOCOBALAMIN 1000 MCG/ML IJ SOLN
1000.0000 ug | Freq: Once | INTRAMUSCULAR | Status: AC
  Administered 2019-08-29: 1000 ug via INTRAMUSCULAR

## 2019-08-29 NOTE — Patient Instructions (Addendum)
Thank you for coming in today.  Plan for PT.  Use ankle brace as needed.  Recheck in 4 weeks.  Let me know if this is not working.   I am here for you.   Call or go to the ER if you develop a large red swollen joint with extreme pain or oozing puss.
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
## 2019-08-29 NOTE — Progress Notes (Signed)
Per orders of Webb Silversmith, NP, injection of vit Y34 given by Brenton Grills. Patient tolerated injection well.

## 2019-09-04 ENCOUNTER — Encounter: Payer: Self-pay | Admitting: Physical Therapy

## 2019-09-04 ENCOUNTER — Other Ambulatory Visit: Payer: Self-pay

## 2019-09-04 ENCOUNTER — Ambulatory Visit: Payer: 59 | Attending: Family Medicine | Admitting: Physical Therapy

## 2019-09-04 DIAGNOSIS — R262 Difficulty in walking, not elsewhere classified: Secondary | ICD-10-CM

## 2019-09-04 DIAGNOSIS — M25571 Pain in right ankle and joints of right foot: Secondary | ICD-10-CM

## 2019-09-04 DIAGNOSIS — M25561 Pain in right knee: Secondary | ICD-10-CM | POA: Diagnosis present

## 2019-09-04 DIAGNOSIS — M6281 Muscle weakness (generalized): Secondary | ICD-10-CM | POA: Diagnosis present
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
## 2019-09-06 ENCOUNTER — Other Ambulatory Visit: Payer: Self-pay | Admitting: Family Medicine

## 2019-09-12 ENCOUNTER — Encounter: Payer: Self-pay | Admitting: Physical Therapy

## 2019-09-12 ENCOUNTER — Other Ambulatory Visit: Payer: Self-pay

## 2019-09-12 ENCOUNTER — Ambulatory Visit: Payer: 59 | Attending: Family Medicine | Admitting: Physical Therapy

## 2019-09-12 DIAGNOSIS — M25561 Pain in right knee: Secondary | ICD-10-CM | POA: Diagnosis present

## 2019-09-12 DIAGNOSIS — R262 Difficulty in walking, not elsewhere classified: Secondary | ICD-10-CM | POA: Diagnosis present

## 2019-09-12 DIAGNOSIS — M6281 Muscle weakness (generalized): Secondary | ICD-10-CM | POA: Diagnosis present

## 2019-09-12 DIAGNOSIS — M25571 Pain in right ankle and joints of right foot: Secondary | ICD-10-CM | POA: Diagnosis present
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
## 2019-09-19 ENCOUNTER — Ambulatory Visit: Payer: 59 | Admitting: Physical Therapy

## 2019-09-24 ENCOUNTER — Ambulatory Visit: Payer: 59 | Admitting: Physical Therapy

## 2019-09-26 ENCOUNTER — Ambulatory Visit: Payer: Self-pay

## 2019-09-26 ENCOUNTER — Ambulatory Visit: Payer: 59 | Admitting: Family Medicine

## 2019-09-26 ENCOUNTER — Ambulatory Visit (INDEPENDENT_AMBULATORY_CARE_PROVIDER_SITE_OTHER): Payer: 59

## 2019-09-26 ENCOUNTER — Encounter: Payer: Self-pay | Admitting: Family Medicine

## 2019-09-26 VITALS — BP 102/70 | HR 86 | Ht 65.0 in | Wt 230.0 lb

## 2019-09-26 DIAGNOSIS — M25561 Pain in right knee: Secondary | ICD-10-CM

## 2019-09-26 DIAGNOSIS — M25571 Pain in right ankle and joints of right foot: Secondary | ICD-10-CM

## 2019-09-26 DIAGNOSIS — Z7185 Encounter for immunization safety counseling: Secondary | ICD-10-CM

## 2019-09-26 DIAGNOSIS — Z7189 Other specified counseling: Secondary | ICD-10-CM

## 2019-09-26 NOTE — Patient Instructions (Signed)
Thank you for coming in today.  Continue PT.  Get xray today.  If not better in about a month next step may be MRI knee and ankle injection or MRI.
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
## 2019-09-27 ENCOUNTER — Other Ambulatory Visit: Payer: Self-pay

## 2019-09-27 ENCOUNTER — Ambulatory Visit: Payer: 59 | Admitting: Physical Therapy

## 2019-09-27 ENCOUNTER — Encounter: Payer: Self-pay | Admitting: Physical Therapy

## 2019-09-27 DIAGNOSIS — M25571 Pain in right ankle and joints of right foot: Secondary | ICD-10-CM

## 2019-09-27 DIAGNOSIS — R262 Difficulty in walking, not elsewhere classified: Secondary | ICD-10-CM

## 2019-09-27 DIAGNOSIS — M6281 Muscle weakness (generalized): Secondary | ICD-10-CM

## 2019-09-27 DIAGNOSIS — M25561 Pain in right knee: Secondary | ICD-10-CM

## 2019-09-27 NOTE — Progress Notes (Signed)
Xray right ankle looks pretty normal to radiology.
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
## 2019-09-27 NOTE — Progress Notes (Signed)
Xray right knee shows some swelling. If not better MRI will show more.

## 2019-10-01 ENCOUNTER — Ambulatory Visit: Payer: 59 | Admitting: Physical Therapy

## 2019-10-01 ENCOUNTER — Encounter: Payer: Self-pay | Admitting: Physical Therapy

## 2019-10-01 ENCOUNTER — Other Ambulatory Visit: Payer: Self-pay

## 2019-10-01 DIAGNOSIS — M25571 Pain in right ankle and joints of right foot: Secondary | ICD-10-CM

## 2019-10-01 DIAGNOSIS — R262 Difficulty in walking, not elsewhere classified: Secondary | ICD-10-CM

## 2019-10-01 DIAGNOSIS — M25561 Pain in right knee: Secondary | ICD-10-CM

## 2019-10-01 DIAGNOSIS — M6281 Muscle weakness (generalized): Secondary | ICD-10-CM
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
## 2019-10-02 ENCOUNTER — Ambulatory Visit (INDEPENDENT_AMBULATORY_CARE_PROVIDER_SITE_OTHER): Payer: 59 | Admitting: *Deleted

## 2019-10-02 DIAGNOSIS — E538 Deficiency of other specified B group vitamins: Secondary | ICD-10-CM | POA: Diagnosis not present

## 2019-10-02 MED ORDER — CYANOCOBALAMIN 1000 MCG/ML IJ SOLN
1000.0000 ug | Freq: Once | INTRAMUSCULAR | Status: AC
  Administered 2019-10-02: 1000 ug via INTRAMUSCULAR

## 2019-10-02 NOTE — Progress Notes (Signed)
Per orders of Dr. Tower, injection of Vitamin B12 given by Kennice Finnie Simpson. Patient tolerated injection well.     

## 2019-10-04 ENCOUNTER — Ambulatory Visit: Payer: 59 | Admitting: Physical Therapy

## 2019-10-08 ENCOUNTER — Other Ambulatory Visit: Payer: Self-pay

## 2019-10-08 ENCOUNTER — Ambulatory Visit: Payer: 59 | Admitting: Physical Therapy

## 2019-10-08 ENCOUNTER — Encounter: Payer: Self-pay | Admitting: Physical Therapy

## 2019-10-08 DIAGNOSIS — M25561 Pain in right knee: Secondary | ICD-10-CM

## 2019-10-08 DIAGNOSIS — M25571 Pain in right ankle and joints of right foot: Secondary | ICD-10-CM | POA: Diagnosis not present

## 2019-10-08 DIAGNOSIS — M6281 Muscle weakness (generalized): Secondary | ICD-10-CM

## 2019-10-08 DIAGNOSIS — R262 Difficulty in walking, not elsewhere classified: Secondary | ICD-10-CM
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
## 2019-10-10 ENCOUNTER — Ambulatory Visit: Payer: 59 | Admitting: Physical Therapy

## 2019-10-15 ENCOUNTER — Ambulatory Visit: Payer: 59 | Admitting: Physical Therapy

## 2019-10-17 ENCOUNTER — Encounter: Payer: 59 | Admitting: Physical Therapy

## 2019-10-22 ENCOUNTER — Encounter: Payer: 59 | Admitting: Physical Therapy

## 2019-10-24 ENCOUNTER — Encounter: Payer: 59 | Admitting: Physical Therapy

## 2019-10-29 ENCOUNTER — Encounter: Payer: 59 | Admitting: Physical Therapy

## 2019-10-31 ENCOUNTER — Encounter: Payer: 59 | Admitting: Physical Therapy

## 2019-11-05 ENCOUNTER — Telehealth: Payer: Self-pay | Admitting: Physical Therapy

## 2019-11-05 ENCOUNTER — Ambulatory Visit: Payer: 59 | Attending: Family Medicine | Admitting: Physical Therapy

## 2019-11-05 NOTE — Telephone Encounter (Signed)
Called patient when she did not show up for her appointment at 11:15 today. Patient answered and said she thought it was scheduled for tomorrow. Stated she is taking her husband somewhere and would like to call back tomorrow to reschedule. Informed her of her next scheduled appointment Wed 11/07/19 at 11:15 but that she does not have any scheduled after that. Patient stated she would call tomorrow to reschedule.  Everlean Alstrom. Graylon Good, PT, DPT 11/05/19, 3:39 PM

## 2019-11-06 ENCOUNTER — Ambulatory Visit (INDEPENDENT_AMBULATORY_CARE_PROVIDER_SITE_OTHER)
Admission: RE | Admit: 2019-11-06 | Discharge: 2019-11-06 | Disposition: A | Discharge status: Home / Self Care | Payer: 59 | Source: Ambulatory Visit | Attending: Family Medicine | Admitting: Family Medicine

## 2019-11-06 ENCOUNTER — Ambulatory Visit: Payer: 59 | Admitting: Family Medicine

## 2019-11-06 ENCOUNTER — Encounter: Payer: Self-pay | Admitting: Family Medicine

## 2019-11-06 ENCOUNTER — Other Ambulatory Visit: Payer: Self-pay

## 2019-11-06 ENCOUNTER — Other Ambulatory Visit: Payer: Self-pay | Admitting: Family Medicine

## 2019-11-06 VITALS — BP 132/70 | HR 79 | Temp 96.9°F | Ht 65.0 in | Wt 234.2 lb

## 2019-11-06 DIAGNOSIS — R0789 Other chest pain: Secondary | ICD-10-CM | POA: Diagnosis not present

## 2019-11-06 DIAGNOSIS — E538 Deficiency of other specified B group vitamins: Secondary | ICD-10-CM

## 2019-11-06 DIAGNOSIS — M25559 Pain in unspecified hip: Secondary | ICD-10-CM

## 2019-11-06 DIAGNOSIS — Z23 Encounter for immunization: Secondary | ICD-10-CM | POA: Diagnosis not present

## 2019-11-06 DIAGNOSIS — G5712 Meralgia paresthetica, left lower limb: Secondary | ICD-10-CM | POA: Diagnosis not present

## 2019-11-06 DIAGNOSIS — F17200 Nicotine dependence, unspecified, uncomplicated: Secondary | ICD-10-CM

## 2019-11-06 DIAGNOSIS — M546 Pain in thoracic spine: Secondary | ICD-10-CM | POA: Diagnosis not present

## 2019-11-06 DIAGNOSIS — G8929 Other chronic pain: Secondary | ICD-10-CM

## 2019-11-06 MED ORDER — GABAPENTIN 100 MG PO CAPS: ORAL_CAPSULE | ORAL | 3 refills | Status: DC

## 2019-11-06 MED ORDER — CYANOCOBALAMIN 1000 MCG/ML IJ SOLN
1000.0000 ug | Freq: Once | INTRAMUSCULAR | Status: AC
  Administered 2019-11-06: 1000 ug via INTRAMUSCULAR

## 2019-11-06 NOTE — Assessment & Plan Note (Signed)
Suspect this is the cause of lateral L thigh discomfort/tingling  Bothersome esp at night  Disc avoidance of tight clothing  Wt loss enc as well  Px gabapentin to titrate up to 300 mg qhs if helpful  Xray done of hip as well due to groin discomfort

## 2019-11-06 NOTE — Assessment & Plan Note (Addendum)
Some tenderness- over her sternum  Smoker  No cough Also some mid back pain -likely msk  cxr done today

## 2019-11-06 NOTE — Assessment & Plan Note (Signed)
Disc in detail risks of smoking and possible outcomes including copd, vascular/ heart disease, cancer , respiratory and sinus infections  Pt voices understanding Pt would like to quit before jan 1 Aware chantix is not available now

## 2019-11-06 NOTE — Progress Notes (Signed)
Subjective:    Patient ID: Evelyn Shepherd, female    DOB: Jan 19, 1960, 59 y.o.   MRN: [id number]  This visit occurred during the SARS-CoV-2 public health emergency.  Safety protocols were in place, including screening questions prior to the visit, additional usage of staff PPE, and extensive cleaning of exam room while observing appropriate contact time as indicated for disinfecting solutions.    HPI Pt presents with c/o numbness in L leg   Wt Readings from Last 3 Encounters:  11/06/19 234 lb 3 oz (106.2 kg)  09/26/19 230 lb (104.3 kg)  08/29/19 230 lb (104.3 kg)   38.97 kg/m  For quite some time she has numbness -thigh to the knee Worse when standing or walking From pins and needles to a burning sensation  Hard to sleep (is a side sleeper)   No injury  For exercise- going to [employer] and walks on the treadmill and does leg work  (machines)  Symptoms do not change while exercising   No skin changes Does have a knot on her L buttock/low - ? [place]  Not tender to the touch   Low back has not been bothering her  Most of her pain is thoracic   Has not seen anyone for it   (used to be low back)   Shoulder keeps her from sleeping on the other side   Lab Results  Component Value Date   HGBA1C 5.8 11/23/2018   Gets pain in mid chest occ  5-10 minutes-dull  No sob or other symptoms  Some tenderness of sternum No cough  Still smokes    Pt has hx of LS surgery in the past   Vit B12 def  B12 shots are ordered monthly along with oral B12  Lab Results  Component Value Date   VITAMINB12 400 05/01/2019   Last injection was 9/21  Getting ready to quit smoking again  First of the year is her goal  chantix worked well in the past   Patient Active Problem List   Diagnosis Date Noted  . Thoracic back pain 11/06/2019  . Meralgia paraesthetica, left 11/06/2019  . Right shoulder pain 05/01/2019  . Hip pain 05/01/2019  . Muscle cramping 05/01/2019  . Bariatric  surgery status 05/01/2019  . Urinary frequency 11/23/2018  . Screening mammogram, encounter for 11/23/2018  . B12 deficiency 11/23/2018  . Vertigo 07/17/2018  . Right ankle pain 10/17/2017  . Smoker 06/08/2017  . History of shingles 01/22/2016  . Bursitis of left hip 01/29/2015  . Low back pain 12/26/2013  . Brachioradial pruritus 11/07/2013  . Fatigue 11/07/2013  . Encounter for routine gynecological examination 04/11/2013  . Chest pain 04/11/2013  . Plantar fasciitis 04/11/2013  . Colon cancer screening 02/13/2013  . Screening for breast cancer 04/13/2011  . Special screening for malignant neoplasms, colon 04/13/2011  . Seborrheic dermatitis 04/13/2011  . Routine general medical examination at a health care facility 03/25/2011  . AMENORRHEA 09/18/2008  . Hypothyroidism 08/11/2007  . LOW BACK PAIN, CHRONIC 07/14/2006  . Prediabetes 07/06/2006  . Hyperlipidemia 07/06/2006  . Depression, recurrent ([place]) 07/06/2006  . Migraine without aura 07/06/2006  . GERD 07/06/2006   Past Medical History:  Diagnosis Date  . History of depression   . History of gastroesophageal reflux (GERD)   . History of hyperlipidemia   . History of migraine   . History of obesity    lap band surgery  . History of pneumonia 2005   Past Surgical History:  Procedure Laterality Date  . AUGMENTATION MAMMAPLASTY Bilateral 1991  . BREAST ENHANCEMENT SURGERY    . CESAREAN SECTION    . DENTAL SURGERY    . LAPAROSCOPIC GASTRIC BANDING  07/31/07  . LUMBAR DISC SURGERY    . TONGUE SURGERY  03/2005   lesion removal   Social History   Tobacco Use  . Smoking status: Current Every Day Smoker    Types: Cigarettes    Last attempt to quit: 09/25/2017    Years since quitting: 2.1  . Smokeless tobacco: Never Used  Substance Use Topics  . Alcohol use: Yes    Alcohol/week: 0.0 standard drinks    Comment: occasional on monthly basis  . Drug use: No   Family History  Problem Relation Age of Onset  .  Pneumonia Father        died  . Lung cancer Father   . Kidney cancer Mother   . Diabetes Mother   . Depression Brother        commited suicide  . Alcohol abuse Brother   . Early menopause Other        family  . Breast cancer Paternal Aunt   . Breast cancer Paternal Aunt   . Colon cancer Neg Hx   . Pancreatic cancer Neg Hx   . Rectal cancer Neg Hx   . Stomach cancer Neg Hx    Allergies  Allergen Reactions  . Meloxicam     REACTION: unspecified  . Sulfonamide Derivatives     REACTION: unspecified   Current Outpatient Medications on File Prior to Visit  Medication Sig Dispense Refill  . atorvastatin (LIPITOR) 10 MG tablet Take 1 tablet (10 mg total) by mouth daily. 90 tablet 3  . diclofenac (VOLTAREN) 75 MG EC tablet Take 1 tablet (75 mg total) by mouth 2 (two) times daily. 60 tablet 3  . escitalopram (LEXAPRO) 20 MG tablet Take 1 tablet (20 mg total) by mouth daily. 90 tablet 3  . fluticasone (FLONASE) 50 MCG/ACT nasal spray PLACE 1 SPRAY INTO BOTH NOSTRILS 2 (TWO) TIMES DAILY. (Patient taking differently: Place 1 spray into both nostrils 2 (two) times daily as needed. ) 16 g 2  . ketoconazole (NIZORAL) 2 % cream APPLY TO AREAS OF FACE THAT ARE AFFECTED ONCE DAILY 60 g 0  . meclizine (ANTIVERT) 25 MG tablet Take 1 tablet (25 mg total) by mouth 3 (three) times daily as needed for dizziness or nausea. Caution of sedation 30 tablet 2  . metaxalone (SKELAXIN) 800 MG tablet TAKE 1 TABLET BY MOUTH 3 TIMES DAILY AS NEEDED FOR BACK PAIN 30 tablet 0  . SUMAtriptan (IMITREX) 100 MG tablet May repeat in 2 hours if headache persists or recurs. 27 tablet 3  . Vitamin D, Ergocalciferol, (DRISDOL) 1.25 MG (50000 UNIT) CAPS capsule Take 1 capsule (50,000 Units total) by mouth every 7 (seven) days. 12 capsule 0   No current facility-administered medications on file prior to visit.     Review of Systems  Constitutional: Negative for activity change, appetite change, fatigue, fever and unexpected  weight change.  HENT: Negative for congestion, ear pain, rhinorrhea, sinus pressure and sore throat.   Eyes: Negative for pain, redness and visual disturbance.  Respiratory: Negative for cough, shortness of breath and wheezing.   Cardiovascular: Positive for chest pain. Negative for palpitations and leg swelling.  Gastrointestinal: Negative for abdominal pain, blood in stool, constipation and diarrhea.  Endocrine: Negative for polydipsia and polyuria.  Genitourinary: Negative for dysuria, frequency  and urgency.  Musculoskeletal: Positive for arthralgias and back pain. Negative for myalgias.  Skin: Negative for pallor and rash.  Allergic/Immunologic: Negative for environmental allergies.  Neurological: Positive for numbness. Negative for dizziness, tremors, syncope, speech difficulty, weakness and headaches.  Hematological: Negative for adenopathy. Does not bruise/bleed easily.  Psychiatric/Behavioral: Negative for decreased concentration and dysphoric mood. The patient is not nervous/anxious.        Objective:   Physical Exam Constitutional:      General: She is not in acute distress.    Appearance: Normal appearance. She is well-developed. She is obese. She is not ill-appearing or diaphoretic.  HENT:     Head: Normocephalic and atraumatic.  Eyes:     General: No scleral icterus.    Conjunctiva/sclera: Conjunctivae normal.     Pupils: Pupils are equal, round, and reactive to light.  Neck:     Thyroid: No thyromegaly.     Vascular: No carotid bruit or JVD.  Cardiovascular:     Rate and Rhythm: Normal rate and regular rhythm.     Heart sounds: Normal heart sounds. No gallop.   Pulmonary:     Effort: Pulmonary effort is normal. No respiratory distress.     Breath sounds: Normal breath sounds. No wheezing or rales.     Comments: bs are slightly distant Abdominal:     General: Bowel sounds are normal. There is no distension or abdominal bruit.     Palpations: Abdomen is soft. There  is no mass.     Tenderness: There is no abdominal tenderness. There is no right CVA tenderness, left CVA tenderness, guarding or rebound.     Hernia: No hernia is present.  Musculoskeletal:        General: Tenderness present.     Cervical back: Normal range of motion and neck supple. No tenderness.     Right lower leg: No edema.     Left lower leg: No edema.     Comments: Some tenderness L scapular area and also low sternum  No spinous tenderness  Pain in groin with int/ext rot of hips  Lymphadenopathy:     Cervical: No cervical adenopathy.  Skin:    General: Skin is warm and dry.     Coloration: Skin is not pale.     Findings: No erythema or rash.  Neurological:     Mental Status: She is alert.     Deep Tendon Reflexes: Reflexes are normal and symmetric.     Comments: Hyper sens to soft touch over Left lateral thigh   Psychiatric:        Mood and Affect: Mood normal.           Assessment & Plan:   Problem List Items Addressed This Visit      Nervous and Auditory   Meralgia paraesthetica, left - Primary    Suspect this is the cause of lateral L thigh discomfort/tingling  Bothersome esp at night  Disc avoidance of tight clothing  Wt loss enc as well  Px gabapentin to titrate up to 300 mg qhs if helpful  Xray done of hip as well due to groin discomfort      Relevant Medications   gabapentin (NEURONTIN) 100 MG capsule     Other   Chest pain    Some tenderness- over her sternum  Smoker  No cough Also some mid back pain -likely msk  cxr done today      Relevant Orders   DG Chest 2 View  Smoker    Disc in detail risks of smoking and possible outcomes including copd, vascular/ heart disease, cancer , respiratory and sinus infections  Pt voices understanding Pt would like to quit before jan 1 Aware chantix is not available now      Relevant Orders   DG Chest 2 View   B12 deficiency    Monthly inj with oral supplementation as well  inj today      Hip  pain    Both but worse L side  Groin discomfort  Worse with int/ext rot of hip  Xray today      Relevant Orders   DG Hip Unilat W OR W/O Pelvis 2-3 Views Left   Thoracic back pain    Suspect muscular  May consider PT for this  Good matress/sleep number already  cxr done in light of also mid sternal pain        Other Visit Diagnoses    Need for influenza vaccination       Relevant Orders   Flu Vaccine QUAD 6+ mos PF IM (Fluarix Quad PF) (Completed)

## 2019-11-06 NOTE — Assessment & Plan Note (Signed)
Suspect muscular  May consider PT for this  Good matress/sleep number already  cxr done in light of also mid sternal pain

## 2019-11-06 NOTE — Assessment & Plan Note (Signed)
Both but worse L side  Groin discomfort  Worse with int/ext rot of hip  Xray today

## 2019-11-06 NOTE — Assessment & Plan Note (Signed)
Monthly inj with oral supplementation as well  inj today

## 2019-11-06 NOTE — Patient Instructions (Addendum)
For the thigh symptoms Start gabapentin 100 mg at bedtime If well tolerated increase gradually to 300 mg  Let us know if not helpful   Work on weight loss Do not wear clothing that is tight in the groin area   For hip pain-xray today  For chest/back pain - chest xray today   Flu shot today  B12 shot today

## 2019-11-07 ENCOUNTER — Ambulatory Visit: Payer: 59 | Admitting: Physical Therapy

## 2019-11-20 NOTE — Progress Notes (Signed)
[name] T. [name], MD, [place]  Primary Care and [place] at [facility] [place], [address]  Phone: [phone]  FAX: [name] - 59 y.o. female  MRN [id number]  Date of Birth: 1960-11-13  Date: 11/21/2019  PCP: Abner Greenspan, MD  Referral: Abner Greenspan, MD  Chief Complaint  Patient presents with  . Numbness    Left hip and thigh  . Fall    2 to 3 weeks ago  . Shoulder Pain    Right  . Back Pain    Upper back radiates up neck    This visit occurred during the SARS-CoV-2 public health emergency.  Safety protocols were in place, including screening questions prior to the visit, additional usage of staff PPE, and extensive cleaning of exam room while observing appropriate contact time as indicated for disinfecting solutions.   Subjective:   Evelyn Shepherd is a 59 y.o. very pleasant female patient with Body mass index is 38.65 kg/m. who presents with the following:  On initial discussion, she describes substernal chest pain and pressure, pressure and pain in the jaw and neck, as well as pain in the arm.  This has been going on for about 4 months, but it has been escalating in its intensity, notably over recent weeks.  She was initially going to see me for MSK complaints, but she almost immediately brings up her chest pain.  Chest x-ray on 11/06/2019 that was unremarkable.  This was her initial reason for coming into the office: Very pleasant 59 year old patient and she presents with some ongoing left-sided hip pain and groin pain.  Her plain films do not show any kind of specific degenerative joint disease or other pathology.  My partner Dr. Glori Bickers felt like she was having some meralgia paresthetica and did start her on some gabapentin.  She saw her on November 06, 2019.  Patient also has had some intermittent complaints from pain, thoracic to her lumbar spine region.  She has a  history of some lumbar spine surgery, but the exact procedure is unclear to me.  She has also had a lap band in the past, as well.  1 year lateral thigh numbness.  Sometimes it is really painful and it feels like it is on fire. Standing is worse, less  ___________________________________________________________________  She is having some substernal chest pain right now.  Stabbing and pushing on the middle of her chest.  Now she is having some right now, ended up throwing up. Also some pressure and nauseated right now.  Pressure at the neck and the jaw.   Cardiac risk factors: 0 pack year history Mother had a stroke, probably more than one Mom also had heart surgery, ? Mild heart attack Hyperlipidemia Sister does some SVT, she did have a heart cath Prediabetes  She had a nuclear stress test in 2015, low risk with EF of 61%  She previously saw Dr. Burt Knack in 2015 with [facility]  Review of Systems is noted in the HPI, as appropriate   Objective:   BP 90/60   Pulse 68   Temp 97.8 F (36.6 C) (Temporal)   Ht 5\' 5"  (1.651 m)   Wt 232 lb 4 oz (105.3 kg)   SpO2 93%   BMI 38.65 kg/m    GEN: WDWN, NAD, Non-toxic HEENT: Atraumatic, Normocephalic. Neck supple. No masses. CV: RRR, No M/G/R. No JVD. No thrill. No extra heart  sounds. PULM: CTA B, no wheezes, crackles, rhonchi. No retractions. No resp. distress. No accessory muscle use. EXTR: No c/c/e NEURO Normal gait.  PSYCH: Normally interactive. Conversant.    The patient is not having any periscapular rhomboid pain.  No upper trap pain.  Cervical spine muscular tenderness.  She is not having any pain in the spinous processes.  She is not having pain in the posterior aspect of her rotator cuff or anterior chest.  Radiology: DG Chest 2 View  Result Date: 11/08/2019 CLINICAL DATA:  Pain and tenderness over the lower sternum. EXAM: CHEST - 2 VIEW COMPARISON:  December 06, 2018 FINDINGS: The heart size and mediastinal  contours are within normal limits. Both lungs are clear. The visualized skeletal structures are unremarkable. IMPRESSION: No active cardiopulmonary disease. Electronically Signed   By: Virgina Norfolk M.D.   On: 11/08/2019 21:28   DG Hip Unilat W OR W/O Pelvis 2-3 Views Left  Result Date: 11/08/2019 CLINICAL DATA:  Groin pain. EXAM: DG HIP (WITH OR WITHOUT PELVIS) 2-3V LEFT COMPARISON:  May 15, 2019 FINDINGS: There is no evidence of hip fracture or dislocation. There is no evidence of arthropathy or other focal bone abnormality. IMPRESSION: Negative. Electronically Signed   By: Virgina Norfolk M.D.   On: 11/08/2019 21:28    Assessment and Plan:     ICD-10-CM   1. Chest pain, unspecified type  R07.9 EKG 12-Lead    Ambulatory referral to Cardiology  2. Left hip pain  M25.552   3. Chest pressure  R07.89 Ambulatory referral to Cardiology  4. Jaw pain  R68.84 Ambulatory referral to Cardiology  5. Bilateral arm pain  M79.601 Ambulatory referral to Cardiology   M79.602    Total encounter time: 40 minutes. This includes total time spent on the day of encounter.  Conversion to chest pain and cardiac risk evaluation rather than MSK.  This is not consistent with musculoskeletal chest pain.  She does have orthopedic pathology going on, but this is not her chest pain.  Extensive chart review, prior study reviewed, lab review consultation notes.  She has multiple cardiac risk factors and she is having some intermittent chest pain that has been escalating in character over the last few months.  This is very worrisome, and I think that she does need to have a relatively urgent cardiology consultation.  This has been ongoing for several months, and I do think it is probably reasonable for an outpatient work-up.  I think with the risks and benefits of going to the ER are right now in a massive COVID-19 outbreak, potential risks outweigh potential benefits going to the emergency room.  We have already  contacted [facility] cardiology, and they have been gracious enough enough to see her on Friday.  EKG: Normal sinus rhythm. Normal axis, normal R wave progression, No acute ST elevation or depression.   No orders of the defined types were placed in this encounter.  Medications Discontinued During This Encounter  Medication Reason  . fluticasone (FLONASE) 50 MCG/ACT nasal spray Duplicate   Orders Placed This Encounter  Procedures  . Ambulatory referral to Cardiology  . EKG 12-Lead    Follow-up: No follow-ups on file.  Signed,  Maud Deed. Bobbi Yount, MD   Outpatient Encounter Medications as of 11/21/2019  Medication Sig  . atorvastatin (LIPITOR) 10 MG tablet Take 1 tablet (10 mg total) by mouth daily.  . diclofenac (VOLTAREN) 75 MG EC tablet Take 1 tablet (75 mg total) by mouth 2 (two) times  daily.  . escitalopram (LEXAPRO) 20 MG tablet Take 1 tablet (20 mg total) by mouth daily.  . fluticasone (FLONASE) 50 MCG/ACT nasal spray Place 2 sprays into both nostrils daily as needed for allergies or rhinitis.  Marland Kitchen gabapentin (NEURONTIN) 100 MG capsule Take 1 to 3 pills at bedtime by mouth  . ketoconazole (NIZORAL) 2 % cream APPLY TO AREAS OF FACE THAT ARE AFFECTED ONCE DAILY  . meclizine (ANTIVERT) 25 MG tablet Take 1 tablet (25 mg total) by mouth 3 (three) times daily as needed for dizziness or nausea. Caution of sedation  . metaxalone (SKELAXIN) 800 MG tablet TAKE 1 TABLET BY MOUTH 3 TIMES DAILY AS NEEDED FOR BACK PAIN  . SUMAtriptan (IMITREX) 100 MG tablet May repeat in 2 hours if headache persists or recurs.  . Vitamin D, Ergocalciferol, (DRISDOL) 1.25 MG (50000 UNIT) CAPS capsule Take 1 capsule (50,000 Units total) by mouth every 7 (seven) days.  . [DISCONTINUED] fluticasone (FLONASE) 50 MCG/ACT nasal spray PLACE 1 SPRAY INTO BOTH NOSTRILS 2 (TWO) TIMES DAILY. (Patient taking differently: Place 1 spray into both nostrils 2 (two) times daily as needed. )   No facility-administered encounter  medications on file as of 11/21/2019.

## 2019-11-21 ENCOUNTER — Encounter: Payer: Self-pay | Admitting: Family Medicine

## 2019-11-21 ENCOUNTER — Ambulatory Visit: Payer: 59 | Admitting: Family Medicine

## 2019-11-21 ENCOUNTER — Other Ambulatory Visit: Payer: Self-pay

## 2019-11-21 VITALS — BP 90/60 | HR 68 | Temp 97.8°F | Ht 65.0 in | Wt 232.2 lb

## 2019-11-21 DIAGNOSIS — M79601 Pain in right arm: Secondary | ICD-10-CM

## 2019-11-21 DIAGNOSIS — M79602 Pain in left arm: Secondary | ICD-10-CM

## 2019-11-21 DIAGNOSIS — R6884 Jaw pain: Secondary | ICD-10-CM

## 2019-11-21 DIAGNOSIS — R0789 Other chest pain: Secondary | ICD-10-CM | POA: Diagnosis not present

## 2019-11-21 DIAGNOSIS — M25552 Pain in left hip: Secondary | ICD-10-CM | POA: Diagnosis not present

## 2019-11-21 DIAGNOSIS — G5712 Meralgia paresthetica, left lower limb: Secondary | ICD-10-CM

## 2019-11-21 DIAGNOSIS — R079 Chest pain, unspecified: Secondary | ICD-10-CM

## 2019-11-23 ENCOUNTER — Ambulatory Visit: Payer: 59 | Admitting: Cardiology

## 2019-11-23 ENCOUNTER — Telehealth: Payer: Self-pay

## 2019-11-23 ENCOUNTER — Other Ambulatory Visit: Payer: Self-pay

## 2019-11-23 ENCOUNTER — Encounter: Payer: Self-pay | Admitting: Cardiology

## 2019-11-23 VITALS — BP 90/58 | HR 76 | Ht 66.0 in | Wt 234.2 lb

## 2019-11-23 DIAGNOSIS — R0600 Dyspnea, unspecified: Secondary | ICD-10-CM | POA: Diagnosis not present

## 2019-11-23 DIAGNOSIS — E7800 Pure hypercholesterolemia, unspecified: Secondary | ICD-10-CM

## 2019-11-23 DIAGNOSIS — R079 Chest pain, unspecified: Secondary | ICD-10-CM

## 2019-11-23 DIAGNOSIS — R072 Precordial pain: Secondary | ICD-10-CM | POA: Diagnosis not present

## 2019-11-23 DIAGNOSIS — R0609 Other forms of dyspnea: Secondary | ICD-10-CM

## 2019-11-23 MED ORDER — IVABRADINE HCL 7.5 MG PO TABS: 15.0000 mg | ORAL_TABLET | Freq: Once | ORAL | 0 refills | Status: AC

## 2019-11-23 NOTE — Patient Instructions (Signed)
Medication Instructions:   Your physician recommends that you continue on your current medications as directed. Please refer to the Current Medication list given to you today.  *If you need a refill on your cardiac medications before your next appointment, please call your pharmacy*   Lab Work:  Your physician recommends that you return for lab work in:   Within [place] of your scheduled Cardiac CTA  -  Please go to the [facility]. You will check in at the front desk to the right as you walk into the atrium. Valet Parking is offered if needed. - No appointment needed. You may go any day between 7 am and 6 pm.   Testing/Procedures:  1) Your physician has requested that you have an echocardiogram. Echocardiography is a painless test that uses sound waves to create images of your heart. It provides your doctor with information about the size and shape of your heart and how well your hearts chambers and valves are working. This procedure takes approximately one hour. There are no restrictions for this procedure.  2) Your physician has requested that you have cardiac CT. Cardiac computed tomography (CT) is a painless test that uses an x-ray machine to take clear, detailed pictures of your heart.   Your cardiac CT will be scheduled at:  [facility] [address] [place], [place] [address] [phone]  Please arrive 15 mins early for check-in and test prep.     Please follow these instructions carefully (unless otherwise directed):    On the Night Before the Test:  Be sure to Drink plenty of water.  Do not consume any caffeinated/decaffeinated beverages or chocolate 12 hours prior to your test.  Do not take any antihistamines 12 hours prior to your test.   On the Day of the Test:  Drink plenty of water. Do not drink any water within one hour of the test.  Do not eat any food 4 hours prior to the test.  You may take  your regular medications prior to the test.   Take Ivabradine (Corlanor) 2 TABS two hours prior to test.  FEMALES- please wear underwire-free bra if available        After the Test:  Drink plenty of water.  After receiving IV contrast, you may experience a mild flushed feeling. This is normal.  On occasion, you may experience a mild rash up to 24 hours after the test. This is not dangerous. If this occurs, you can take Benadryl 25 mg and increase your fluid intake.  If you experience trouble breathing, this can be serious. If it is severe call 911 IMMEDIATELY. If it is mild, please call our office.  If you take any of these medications: Glipizide/Metformin, Avandament, Glucavance, please do not take 48 hours after completing test unless otherwise instructed.   Once we have confirmed authorization from your insurance company, we will call you to set up a date and time for your test. Based on how quickly your insurance processes prior authorizations requests, please allow up to 4 weeks to be contacted for scheduling your Cardiac CT appointment. Be advised that routine Cardiac CT appointments could be scheduled as many as 8 weeks after your provider has ordered it.  For non-scheduling related questions, please contact the cardiac imaging nurse navigator should you have any questions/concerns: Marchia Bond, Cardiac Imaging Nurse Navigator Burley Saver, Interim Cardiac Imaging Nurse [place] [facility] Services Direct Office Dial: [phone]  For scheduling needs, including cancellations and rescheduling, please call [place], [phone] (temporary number).     Follow-Up: At [facility], you and your health needs are our priority.  As part of our continuing mission to provide you with exceptional heart care, we have created designated Provider Care Teams.  These Care Teams include your primary Cardiologist (physician) and Advanced Practice Providers (APPs -   Physician Assistants and Nurse Practitioners) who all work together to provide you with the care you need, when you need it.  We recommend signing up for the patient portal called "MyChart".  Sign up information is provided on this After Visit Summary.  MyChart is used to connect with patients for Virtual Visits (Telemedicine).  Patients are able to view lab/test results, encounter notes, upcoming appointments, etc.  Non-urgent messages can be sent to your provider as well.   To learn more about what you can do with MyChart, go to [email or web address].    Your next appointment:    Follow up 5 WEEKS after echo/CTA  The format for your next appointment:   In Person  Provider:   Kate Sable, MD

## 2019-11-23 NOTE — Telephone Encounter (Signed)
Incoming fax from [facility] needing a PA for [employer].   After reviewing patients chart she will only need 2 tablets prior to CTA that she will be having done. [place] to find out the cash price.   Spoke with Caryl Pina.  Caryl Pina stated that for 2 tablets it would be - $45 Caryl Pina also stated that she does not currently have Corlanor in stock and she would need to order it.  She stated the earliest she can have it here for the patient is Monday (11/26/2019)  Called patient to make her aware of this. No answer. LMOV.

## 2019-11-23 NOTE — Progress Notes (Signed)
Cardiology Office Note:    Date:  11/23/2019   ID:  [name], DOB 1960-03-03, MRN [id number]  PCP:  Abner Greenspan, MD  [place] Cardiologist:  No primary care provider on file.  [facility] Electrophysiologist:  None   Referring MD: Owens Loffler, MD   Chief Complaint  Patient presents with  . New Patient (Initial Visit)    Ref by Dr. Lorelei Pont for chest pain/pressure, jaw pain and bilateral arm pain. Meds reviewed by the pt. verbally.    Evelyn Shepherd is a 59 y.o. female who is being seen today for the evaluation of chest pain at the request of Copland, Spencer, MD.   History of Present Illness:    Evelyn Shepherd is a 59 y.o. female with a hx of hyperlipidemia, migraines, current smoker x50+ years who presents due to chest pain.  Patient states having chest discomfort over the past several weeks which she describes as tightness right below her sternum, rates it as 9 out of 10 in severity, lasting about 5 minutes, sometimes radiating to her neck.  She describes discomfort as pressure.  Also endorses shortness of breath with exertion.  Denies any history of heart disease.  She smoked for about 15 years, stopped and then restarted a year ago when her grandson passed.  Is planning on quitting smoking soon.  She had stress Myoview 05/2013 with no evidence for ischemia.  Past Medical History:  Diagnosis Date  . History of depression   . History of gastroesophageal reflux (GERD)   . History of hyperlipidemia   . History of migraine   . History of obesity    lap band surgery  . History of pneumonia 2005    Past Surgical History:  Procedure Laterality Date  . AUGMENTATION MAMMAPLASTY Bilateral 1991  . BREAST ENHANCEMENT SURGERY    . CESAREAN SECTION    . DENTAL SURGERY    . LAPAROSCOPIC GASTRIC BANDING  07/31/07  . LUMBAR DISC SURGERY    . TONGUE SURGERY  03/2005   lesion removal    Current Medications: Current Meds  Medication Sig  . atorvastatin (LIPITOR)  10 MG tablet Take 1 tablet (10 mg total) by mouth daily.  . diclofenac (VOLTAREN) 75 MG EC tablet Take 1 tablet (75 mg total) by mouth 2 (two) times daily.  Marland Kitchen escitalopram (LEXAPRO) 20 MG tablet Take 1 tablet (20 mg total) by mouth daily.  . fluticasone (FLONASE) 50 MCG/ACT nasal spray Place 2 sprays into both nostrils daily as needed for allergies or rhinitis.  Marland Kitchen gabapentin (NEURONTIN) 100 MG capsule Take 1 to 3 pills at bedtime by mouth  . ketoconazole (NIZORAL) 2 % cream APPLY TO AREAS OF FACE THAT ARE AFFECTED ONCE DAILY  . Magnesium Citrate 125 MG CAPS Take by mouth in the morning and at bedtime.  . meclizine (ANTIVERT) 25 MG tablet Take 1 tablet (25 mg total) by mouth 3 (three) times daily as needed for dizziness or nausea. Caution of sedation  . metaxalone (SKELAXIN) 800 MG tablet TAKE 1 TABLET BY MOUTH 3 TIMES DAILY AS NEEDED FOR BACK PAIN  . SUMAtriptan (IMITREX) 100 MG tablet May repeat in 2 hours if headache persists or recurs.  . Vitamin D, Ergocalciferol, (DRISDOL) 1.25 MG (50000 UNIT) CAPS capsule Take 1 capsule (50,000 Units total) by mouth every 7 (seven) days.     Allergies:   Meloxicam and Sulfonamide derivatives   Social History   Socioeconomic History  . Marital status: Married  Spouse name: Not on file  . Number of children: Not on file  . Years of education: Not on file  . Highest education level: Not on file  Occupational History  . Not on file  Tobacco Use  . Smoking status: Current Some Day Smoker    Packs/day: 1.50    Years: 12.00    Pack years: 18.00    Types: Cigarettes    Last attempt to quit: 09/25/2017    Years since quitting: 2.1  . Smokeless tobacco: Never Used  . Tobacco comment: Smokes 1 cigarette daily   Vaping Use  . Vaping Use: Never used  Substance and Sexual Activity  . Alcohol use: Yes    Alcohol/week: 0.0 standard drinks    Comment: occasional on monthly basis  . Drug use: No  . Sexual activity: Yes    Partners: Female    Birth  control/protection: Post-menopausal  Other Topics Concern  . Not on file  Social History Narrative  . Not on file   Social Determinants of Health   Financial Resource Strain:   . Difficulty of Paying Living Expenses: Not on file  Food Insecurity:   . Worried About Charity fundraiser in the Last Year: Not on file  . Ran Out of Food in the Last Year: Not on file  Transportation Needs:   . Lack of Transportation (Medical): Not on file  . Lack of Transportation (Non-Medical): Not on file  Physical Activity:   . Days of Exercise per Week: Not on file  . Minutes of Exercise per Session: Not on file  Stress:   . Feeling of Stress : Not on file  Social Connections:   . Frequency of Communication with Friends and Family: Not on file  . Frequency of Social Gatherings with Friends and Family: Not on file  . Attends Religious Services: Not on file  . Active Member of Clubs or Organizations: Not on file  . Attends Archivist Meetings: Not on file  . Marital Status: Not on file     Family History: The patient's family history includes Alcohol abuse in her brother; Breast cancer in her paternal aunt and paternal aunt; Depression in her brother; Diabetes in her mother; Early menopause in an other family member; Kidney cancer in her mother; Lung cancer in her father; Pneumonia in her father. There is no history of Colon cancer, Pancreatic cancer, Rectal cancer, or Stomach cancer.  ROS:   Please see the history of present illness.     All other systems reviewed and are negative.  EKGs/Labs/Other Studies Reviewed:    The following studies were reviewed today:   EKG:  EKG is  ordered today.  The ekg ordered today demonstrates normal sinus rhythm.  Recent Labs: 05/01/2019: ALT 15; BUN 18; Creatinine, Ser 0.82; Hemoglobin 13.8; Magnesium 1.9; Platelets 201.0; Potassium 4.4; Sodium 138; TSH 1.84  Recent Lipid Panel    Component Value Date/Time   CHOL 173 02/28/2019 0733   TRIG  153.0 (H) 02/28/2019 0733   HDL 51.00 02/28/2019 0733   CHOLHDL 3 02/28/2019 0733   VLDL 30.6 02/28/2019 0733   LDLCALC 92 02/28/2019 0733   LDLDIRECT 187.8 10/22/2011 0849     Risk Assessment/Calculations:      Physical Exam:    VS:  BP (!) 90/58 (BP Location: Right Arm, Patient Position: Sitting, Cuff Size: Normal)   Pulse 76   Ht 5' 6"  (1.676 m)   Wt 234 lb 4 oz (106.3 kg)  SpO2 98%   BMI 37.81 kg/m     Wt Readings from Last 3 Encounters:  11/23/19 234 lb 4 oz (106.3 kg)  11/21/19 232 lb 4 oz (105.3 kg)  11/06/19 234 lb 3 oz (106.2 kg)     GEN:  Well nourished, well developed in no acute distress HEENT: Normal NECK: No JVD; No carotid bruits LYMPHATICS: No lymphadenopathy CARDIAC: RRR, no murmurs, rubs, gallops RESPIRATORY:  Clear to auscultation without rales, wheezing or rhonchi  ABDOMEN: Soft, non-tender, non-distended MUSCULOSKELETAL:  No edema; No deformity  SKIN: Warm and dry NEUROLOGIC:  Alert and oriented x 3 PSYCHIATRIC:  Normal affect   ASSESSMENT:    1. Chest pain of uncertain etiology   2. Dyspnea on exertion   3. Pure hypercholesterolemia   4. Precordial pain    PLAN:    In order of problems listed above:  1. Patient with chest pain.  Has risk factors of hyperlipidemia, current smoker.  Will evaluate with echocardiogram for any structural or wall motion abnormalities.  Get coronary CTA to evaluate presence of CAD. 2. Patient with shortness of breath on exertion.  Echo as above. 3. History of hyperlipidemia, continue statin as prescribed.  Last lipid panel appears well controlled  Follow-up after echo and coronary CTA.    Shared Decision Making/Informed Consent      Medication Adjustments/Labs and Tests Ordered: Current medicines are reviewed at length with the patient today.  Concerns regarding medicines are outlined above.  Orders Placed This Encounter  Procedures  . CT CORONARY MORPH W/CTA COR W/SCORE W/CA W/CM &/OR WO/CM  . CT  CORONARY FRACTIONAL FLOW RESERVE DATA PREP  . CT CORONARY FRACTIONAL FLOW RESERVE FLUID ANALYSIS  . Basic metabolic panel  . EKG 12-Lead  . ECHOCARDIOGRAM COMPLETE   Meds ordered this encounter  Medications  . ivabradine (CORLANOR) 7.5 MG TABS tablet    Sig: Take 2 tablets (15 mg total) by mouth once for 1 dose. Take TWO hours prior to CTA    Dispense:  2 tablet    Refill:  0    Patient Instructions  Medication Instructions:   Your physician recommends that you continue on your current medications as directed. Please refer to the Current Medication list given to you today.  *If you need a refill on your cardiac medications before your next appointment, please call your pharmacy*   Lab Work:  Your physician recommends that you return for lab work in:   Within [place] of your scheduled Cardiac CTA  -  Please go to the [facility]. You will check in at the front desk to the right as you walk into the atrium. Valet Parking is offered if needed. - No appointment needed. You may go any day between 7 am and 6 pm.   Testing/Procedures:  1) Your physician has requested that you have an echocardiogram. Echocardiography is a painless test that uses sound waves to create images of your heart. It provides your doctor with information about the size and shape of your heart and how well your heart's chambers and valves are working. This procedure takes approximately one hour. There are no restrictions for this procedure.  2) Your physician has requested that you have cardiac CT. Cardiac computed tomography (CT) is a painless test that uses an x-ray machine to take clear, detailed pictures of your heart.   Your cardiac CT will be scheduled at:  [facility] [address] [place], [place] [address] [phone]  Please arrive 15 mins early for check-in and test prep.     Please follow these instructions carefully (unless otherwise  directed):    On the Night Before the Test: . Be sure to Drink plenty of water. . Do not consume any caffeinated/decaffeinated beverages or chocolate 12 hours prior to your test. . Do not take any antihistamines 12 hours prior to your test.   On the Day of the Test: . Drink plenty of water. Do not drink any water within one hour of the test. . Do not eat any food 4 hours prior to the test. . You may take your regular medications prior to the test.  . Take Ivabradine (Corlanor) 2 TABS two hours prior to test. . FEMALES- please wear underwire-free bra if available        After the Test: . Drink plenty of water. . After receiving IV contrast, you may experience a mild flushed feeling. This is normal. . On occasion, you may experience a mild rash up to 24 hours after the test. This is not dangerous. If this occurs, you can take Benadryl 25 mg and increase your fluid intake. . If you experience trouble breathing, this can be serious. If it is severe call 911 IMMEDIATELY. If it is mild, please call our office. . If you take any of these medications: Glipizide/Metformin, Avandament, Glucavance, please do not take 48 hours after completing test unless otherwise instructed.   Once we have confirmed authorization from your insurance company, we will call you to set up a date and time for your test. Based on how quickly your insurance processes prior authorizations requests, please allow up to 4 weeks to be contacted for scheduling your Cardiac CT appointment. Be advised that routine Cardiac CT appointments could be scheduled as many as 8 weeks after your provider has ordered it.  For non-scheduling related questions, please contact the cardiac imaging nurse navigator should you have any questions/concerns: Marchia Bond, Cardiac Imaging Nurse Navigator Burley Saver, Interim Cardiac Imaging Nurse [place] [facility] Direct Office Dial: [phone]   For scheduling  needs, including cancellations and rescheduling, please call [place], [phone] (temporary number).     Follow-Up: At [facility], you and your health needs are our priority.  As part of our continuing mission to provide you with exceptional heart care, we have created designated Provider Care Teams.  These Care Teams include your primary Cardiologist (physician) and Advanced Practice Providers (APPs -  Physician Assistants and Nurse Practitioners) who all work together to provide you with the care you need, when you need it.  We recommend signing up for the patient portal called "MyChart".  Sign up information is provided on this After Visit Summary.  MyChart is used to connect with patients for Virtual Visits (Telemedicine).  Patients are able to view lab/test results, encounter notes, upcoming appointments, etc.  Non-urgent messages can be sent to your provider as well.   To learn more about what you can do with MyChart, go to [email or web address].    Your next appointment:    Follow up 5 WEEKS after echo/CTA  The format for your next appointment:   In Person  Provider:   Kate Sable, MD        Signed, Kate Sable, MD  11/23/2019 1:03 PM    [place]

## 2019-11-28 NOTE — Telephone Encounter (Signed)
Called and spoke with patient. I informed her that I would leave a sample bottle of Corlanor at the front desk for her to pick up.  Patient was very grateful and stated she would be here tomorrow to pick it up.

## 2019-12-11 ENCOUNTER — Telehealth (HOSPITAL_COMMUNITY): Payer: Self-pay | Admitting: *Deleted

## 2019-12-11 NOTE — Telephone Encounter (Signed)

## 2019-12-13 ENCOUNTER — Ambulatory Visit
Admission: RE | Admit: 2019-12-13 | Discharge: 2019-12-13 | Disposition: A | Discharge status: Home / Self Care | Payer: 59 | Source: Ambulatory Visit | Attending: Cardiology | Admitting: Cardiology

## 2019-12-13 ENCOUNTER — Other Ambulatory Visit: Payer: Self-pay

## 2019-12-13 DIAGNOSIS — R072 Precordial pain: Secondary | ICD-10-CM

## 2019-12-13 MED ORDER — METOPROLOL TARTRATE 5 MG/5ML IV SOLN
10.0000 mg | Freq: Once | INTRAVENOUS | Status: AC
  Administered 2019-12-13: 10 mg via INTRAVENOUS

## 2019-12-13 MED ORDER — NITROGLYCERIN 0.4 MG SL SUBL
0.8000 mg | SUBLINGUAL_TABLET | Freq: Once | SUBLINGUAL | Status: AC
  Administered 2019-12-13: 0.8 mg via SUBLINGUAL

## 2019-12-13 MED ORDER — IOHEXOL 350 MG/ML SOLN
90.0000 mL | Freq: Once | INTRAVENOUS | Status: AC | PRN
  Administered 2019-12-13: 90 mL via INTRAVENOUS

## 2019-12-13 NOTE — Progress Notes (Signed)
Patient tolerated procedure well. Ambulate w/o difficulty. Sitting in chair drinking water provided. Encouraged to drink extra water today and reasoning explained. Verbalized understanding. All questions answered. ABC intact. No further needs. Discharge from procedure area w/o issues.  

## 2019-12-21 ENCOUNTER — Ambulatory Visit (INDEPENDENT_AMBULATORY_CARE_PROVIDER_SITE_OTHER): Payer: 59

## 2019-12-21 ENCOUNTER — Other Ambulatory Visit: Payer: Self-pay

## 2019-12-21 DIAGNOSIS — R0609 Other forms of dyspnea: Secondary | ICD-10-CM

## 2019-12-21 DIAGNOSIS — R079 Chest pain, unspecified: Secondary | ICD-10-CM

## 2019-12-21 DIAGNOSIS — R0600 Dyspnea, unspecified: Secondary | ICD-10-CM | POA: Diagnosis not present

## 2019-12-21 LAB — ECHOCARDIOGRAM COMPLETE
AR max vel: 2.6 cm2
AV Area VTI: 2.62 cm2
AV Area mean vel: 2.4 cm2
AV Mean grad: 4 mmHg
AV Peak grad: 6.5 mmHg
Ao pk vel: 1.27 m/s
Area-P 1/2: 3.39 cm2
Calc EF: 57 %
S' Lateral: 3.2 cm
Single Plane A2C EF: 56.4 %
Single Plane A4C EF: 57.6 %

## 2019-12-28 ENCOUNTER — Other Ambulatory Visit: Payer: Self-pay

## 2019-12-28 ENCOUNTER — Encounter: Payer: Self-pay | Admitting: Cardiology

## 2019-12-28 ENCOUNTER — Ambulatory Visit: Payer: 59 | Admitting: Cardiology

## 2019-12-28 VITALS — BP 98/60 | HR 78 | Ht 66.0 in | Wt 235.0 lb

## 2019-12-28 DIAGNOSIS — R0602 Shortness of breath: Secondary | ICD-10-CM | POA: Diagnosis not present

## 2019-12-28 DIAGNOSIS — E7800 Pure hypercholesterolemia, unspecified: Secondary | ICD-10-CM | POA: Diagnosis not present

## 2019-12-28 DIAGNOSIS — R079 Chest pain, unspecified: Secondary | ICD-10-CM

## 2019-12-28 DIAGNOSIS — F17200 Nicotine dependence, unspecified, uncomplicated: Secondary | ICD-10-CM | POA: Diagnosis not present

## 2019-12-28 NOTE — Progress Notes (Signed)
Cardiology Office Note:    Date:  12/28/2019   ID:  Evelyn Shepherd, DOB 05-18-1960, MRN [id number]  PCP:  Abner Greenspan, MD  [place] Cardiologist:  No primary care provider on file.  [facility] Electrophysiologist:  None   Referring MD: Abner Greenspan, MD   Chief Complaint  Patient presents with  . Other    Follow up post ECHO. Meds reviewed verbally with patient.     History of Present Illness:    Evelyn Shepherd is a 59 y.o. female with a hx of hyperlipidemia, migraines, current smoker x50+ years who presents for follow-up.  Last seen due to chest pain lasting over several weeks.  Due to risk factors, echo and coronary CTA was performed to evaluate presence of CAD.  She now presents for results.  She states her symptoms of chest discomfort have improved since our last meeting.  She still smokes but has cut back and is working on quitting hopefully before the start of next year.  Also trying to exercise and lose some weight to help with her overall health.   Past Medical History:  Diagnosis Date  . History of depression   . History of gastroesophageal reflux (GERD)   . History of hyperlipidemia   . History of migraine   . History of obesity    lap band surgery  . History of pneumonia 2005    Past Surgical History:  Procedure Laterality Date  . AUGMENTATION MAMMAPLASTY Bilateral 1991  . BREAST ENHANCEMENT SURGERY    . CESAREAN SECTION    . DENTAL SURGERY    . LAPAROSCOPIC GASTRIC BANDING  07/31/07  . LUMBAR DISC SURGERY    . TONGUE SURGERY  03/2005   lesion removal    Current Medications: Current Meds  Medication Sig  . atorvastatin (LIPITOR) 10 MG tablet Take 1 tablet (10 mg total) by mouth daily.  . diclofenac (VOLTAREN) 75 MG EC tablet Take 1 tablet (75 mg total) by mouth 2 (two) times daily.  Marland Kitchen escitalopram (LEXAPRO) 20 MG tablet Take 1 tablet (20 mg total) by mouth daily.  . fluticasone (FLONASE) 50 MCG/ACT nasal spray Place 2 sprays into both  nostrils daily as needed for allergies or rhinitis.  Marland Kitchen gabapentin (NEURONTIN) 100 MG capsule Take 1 to 3 pills at bedtime by mouth  . ketoconazole (NIZORAL) 2 % cream APPLY TO AREAS OF FACE THAT ARE AFFECTED ONCE DAILY  . Magnesium Citrate 125 MG CAPS Take by mouth in the morning and at bedtime.  . meclizine (ANTIVERT) 25 MG tablet Take 1 tablet (25 mg total) by mouth 3 (three) times daily as needed for dizziness or nausea. Caution of sedation  . metaxalone (SKELAXIN) 800 MG tablet TAKE 1 TABLET BY MOUTH 3 TIMES DAILY AS NEEDED FOR BACK PAIN  . SUMAtriptan (IMITREX) 100 MG tablet May repeat in 2 hours if headache persists or recurs.  . Vitamin D, Ergocalciferol, (DRISDOL) 1.25 MG (50000 UNIT) CAPS capsule Take 1 capsule (50,000 Units total) by mouth every 7 (seven) days.     Allergies:   Meloxicam and Sulfonamide derivatives   Social History   Socioeconomic History  . Marital status: Married    Spouse name: Not on file  . Number of children: Not on file  . Years of education: Not on file  . Highest education level: Not on file  Occupational History  . Not on file  Tobacco Use  . Smoking status: Former Smoker    Packs/day: 1.50  Years: 12.00    Pack years: 18.00    Types: Cigarettes    Quit date: 09/25/2017    Years since quitting: 2.2  . Smokeless tobacco: Never Used  . Tobacco comment: Smokes 1 cigarette daily   Vaping Use  . Vaping Use: Never used  Substance and Sexual Activity  . Alcohol use: Yes    Alcohol/week: 0.0 standard drinks    Comment: occasional on monthly basis  . Drug use: No  . Sexual activity: Yes    Partners: Female    Birth control/protection: Post-menopausal  Other Topics Concern  . Not on file  Social History Narrative  . Not on file   Social Determinants of Health   Financial Resource Strain: Not on file  Food Insecurity: Not on file  Transportation Needs: Not on file  Physical Activity: Not on file  Stress: Not on file  Social Connections:  Not on file     Family History: The patient's family history includes Alcohol abuse in her brother; Breast cancer in her paternal aunt and paternal aunt; Depression in her brother; Diabetes in her mother; Early menopause in an other family member; Kidney cancer in her mother; Lung cancer in her father; Pneumonia in her father. There is no history of Colon cancer, Pancreatic cancer, Rectal cancer, or Stomach cancer.  ROS:   Please see the history of present illness.     All other systems reviewed and are negative.  EKGs/Labs/Other Studies Reviewed:    The following studies were reviewed today:   EKG:  EKG not ordered today.   Recent Labs: 05/01/2019: ALT 15; BUN 18; Creatinine, Ser 0.82; Hemoglobin 13.8; Magnesium 1.9; Platelets 201.0; Potassium 4.4; Sodium 138; TSH 1.84  Recent Lipid Panel    Component Value Date/Time   CHOL 173 02/28/2019 0733   TRIG 153.0 (H) 02/28/2019 0733   HDL 51.00 02/28/2019 0733   CHOLHDL 3 02/28/2019 0733   VLDL 30.6 02/28/2019 0733   LDLCALC 92 02/28/2019 0733   LDLDIRECT 187.8 10/22/2011 0849     Risk Assessment/Calculations:      Physical Exam:    VS:  BP 98/60 (BP Location: Left Arm, Patient Position: Sitting, Cuff Size: Large)   Pulse 78   Ht 5\' 6"  (1.676 m)   Wt 235 lb (106.6 kg)   SpO2 96%   BMI 37.93 kg/m     Wt Readings from Last 3 Encounters:  12/28/19 235 lb (106.6 kg)  11/23/19 234 lb 4 oz (106.3 kg)  11/21/19 232 lb 4 oz (105.3 kg)     GEN:  Well nourished, well developed in no acute distress HEENT: Normal NECK: No JVD; No carotid bruits LYMPHATICS: No lymphadenopathy CARDIAC: RRR, no murmurs, rubs, gallops RESPIRATORY:  Clear to auscultation without rales, wheezing or rhonchi  ABDOMEN: Soft, non-tender, non-distended MUSCULOSKELETAL:  No edema; No deformity  SKIN: Warm and dry NEUROLOGIC:  Alert and oriented x 3 PSYCHIATRIC:  Normal affect   ASSESSMENT:    1. Chest pain of uncertain etiology   2. Pure  hypercholesterolemia   3. Smoking   4. Shortness of breath    PLAN:    In order of problems listed above:  1. Patient with chest pain.  Has risk factors of hyperlipidemia, current smoker.  Echocardiogram showed normal systolic function, EF 60 to 65%.  No gross structural abnormalities.  Coronary CTA showed a calcium score of 0, no evidence of CAD.  Patient made aware of results and reassured. 2. History of hyperlipidemia, continue Lipitor.  Last LDL controlled. 3. Current smoker, cessation advised.   4. Symptoms of shortness of breath possibly due to pulmonary etiology from long history of smoking.  Follow-up with PCP regarding possible PFTs with pulmonary medicine.  Obviously, smoking cessation will help with her shortness of breath.  Follow-up as needed   Shared Decision Making/Informed Consent      Medication Adjustments/Labs and Tests Ordered: Current medicines are reviewed at length with the patient today.  Concerns regarding medicines are outlined above.  No orders of the defined types were placed in this encounter.  No orders of the defined types were placed in this encounter.   Patient Instructions  Medication Instructions:  Your physician recommends that you continue on your current medications as directed. Please refer to the Current Medication list given to you today.  *If you need a refill on your cardiac medications before your next appointment, please call your pharmacy*   Lab Work: None Ordered If you have labs (blood work) drawn today and your tests are completely normal, you will receive your results only by: Marland Kitchen MyChart Message (if you have MyChart) OR . A paper copy in the mail If you have any lab test that is abnormal or we need to change your treatment, we will call you to review the results.   Testing/Procedures: None Ordered   Follow-Up: At [facility], you and your health needs are our priority.  As part of our continuing mission to provide you  with exceptional heart care, we have created designated Provider Care Teams.  These Care Teams include your primary Cardiologist (physician) and Advanced Practice Providers (APPs -  Physician Assistants and Nurse Practitioners) who all work together to provide you with the care you need, when you need it.  We recommend signing up for the patient portal called "MyChart".  Sign up information is provided on this After Visit Summary.  MyChart is used to connect with patients for Virtual Visits (Telemedicine).  Patients are able to view lab/test results, encounter notes, upcoming appointments, etc.  Non-urgent messages can be sent to your provider as well.   To learn more about what you can do with MyChart, go to [email or web address].    Your next appointment:   Follow up as needed   The format for your next appointment:   In Person  Provider:   Kate Sable, MD   Other Instructions      Signed, Kate Sable, MD  12/28/2019 12:56 PM    [place]

## 2019-12-28 NOTE — Patient Instructions (Signed)

## 2020-01-15 ENCOUNTER — Other Ambulatory Visit: Payer: Self-pay | Admitting: Family Medicine

## 2020-01-15 DIAGNOSIS — E785 Hyperlipidemia, unspecified: Secondary | ICD-10-CM

## 2020-01-16 ENCOUNTER — Other Ambulatory Visit: Payer: Self-pay | Admitting: Family Medicine

## 2020-01-16 NOTE — Telephone Encounter (Signed)
Last OV was an appt with Dr. Patsy Lager on 11/21/19,  imitrex last filled 11/23/18 #27 tabs 3 additional refill Skelaxin last filled 05/11/18 #30 tabs with 0 refills Lexapro filled 11/23/18 #90 with 3 refills  lipitor also due please advise

## 2020-01-29 ENCOUNTER — Ambulatory Visit (INDEPENDENT_AMBULATORY_CARE_PROVIDER_SITE_OTHER): Payer: 59

## 2020-01-29 DIAGNOSIS — E538 Deficiency of other specified B group vitamins: Secondary | ICD-10-CM | POA: Diagnosis not present

## 2020-01-29 MED ORDER — CYANOCOBALAMIN 1000 MCG/ML IJ SOLN
1000.0000 ug | Freq: Once | INTRAMUSCULAR | Status: AC
  Administered 2020-01-29: 1000 ug via INTRAMUSCULAR

## 2020-01-29 NOTE — Progress Notes (Signed)
Per orders of Allie Bossier, NP, in Dr. Marliss Coots absence, injection of B12 in L. deltoid given by Randall An. Patient tolerated injection well.

## 2020-02-05 ENCOUNTER — Encounter: Payer: Self-pay | Admitting: Family Medicine

## 2020-02-05 ENCOUNTER — Other Ambulatory Visit: Payer: Self-pay | Admitting: Family Medicine

## 2020-02-05 ENCOUNTER — Other Ambulatory Visit: Payer: Self-pay

## 2020-02-05 ENCOUNTER — Ambulatory Visit: Payer: 59 | Admitting: Family Medicine

## 2020-02-05 VITALS — BP 116/64 | HR 67 | Temp 97.1°F | Ht 66.0 in | Wt 241.2 lb

## 2020-02-05 DIAGNOSIS — N3000 Acute cystitis without hematuria: Secondary | ICD-10-CM

## 2020-02-05 DIAGNOSIS — R350 Frequency of micturition: Secondary | ICD-10-CM | POA: Diagnosis not present

## 2020-02-05 DIAGNOSIS — G43009 Migraine without aura, not intractable, without status migrainosus: Secondary | ICD-10-CM

## 2020-02-05 LAB — POC URINALSYSI DIPSTICK (AUTOMATED)
Bilirubin, UA: NEGATIVE
Blood, UA: 80
Glucose, UA: NEGATIVE
Ketones, UA: 5
Leukocytes, UA: NEGATIVE
Nitrite, UA: NEGATIVE
Protein, UA: NEGATIVE
Spec Grav, UA: >=1.03 — AB (ref 1.010–1.025)
Urobilinogen, UA: 0.2 E.U./dL
pH, UA: 6 (ref 5.0–8.0)

## 2020-02-05 MED ORDER — NITROFURANTOIN MONOHYD MACRO 100 MG PO CAPS: 100.0000 mg | ORAL_CAPSULE | Freq: Two times a day (BID) | ORAL | 0 refills | Status: DC

## 2020-02-05 MED ORDER — CYCLOBENZAPRINE HCL 10 MG PO TABS: 10.0000 mg | ORAL_TABLET | Freq: Every evening | ORAL | 0 refills | Status: DC | PRN

## 2020-02-05 NOTE — Assessment & Plan Note (Signed)
Urine odor and pressure before urinating  Concentrated urine with micro blood (that is baseline)  tx with macrobid pending culture  Enc more fluid (water) inst to update if symptoms worsen before cx returns

## 2020-02-05 NOTE — Patient Instructions (Addendum)
Aim for 64 oz of fluid day (more water and less other things if you can)  Urine is very concentrated   Take macrobid as directed   Try the flexeril at bedtime for headache (may help sleep)  Caffeine is bad for headaches if every day  Good for an acute headache once in a while   If symptoms worsen please let us know

## 2020-02-05 NOTE — Assessment & Plan Note (Signed)
Worse in the past week  (none today) Some nausea when head hurts  Disc imp of better hydration  Cut daily caffeine (but can use for headache) Trial of flexeril at bedtime  Update if not starting to improve in a week or if worsening   Would consider a course of prednisone if needed   ? If she has uti /could worsen it also  Will tx that and then update

## 2020-02-05 NOTE — Progress Notes (Signed)
Subjective:    Patient ID: Evelyn Shepherd, female    DOB: [birth year], 60 y.o.   MRN: [id number]  This visit occurred during the SARS-CoV-2 public health emergency.  Safety protocols were in place, including screening questions prior to the visit, additional usage of staff PPE, and extensive cleaning of exam room while observing appropriate contact time as indicated for disinfecting solutions.    HPI Pt presents with c/o headache and urinary symptoms   Wt Readings from Last 3 Encounters:  02/05/20 241 lb 3 oz (109.4 kg)  12/28/19 235 lb (106.6 kg)  11/23/19 234 lb 4 oz (106.3 kg)   38.93 kg/m  Horrible odor in urine  About a week  Sometimes concentrated / no blood  Some pressure before urinating  Increase in frequency and some leaking (that is normal)  No bladder pain  Some flank discomfort- both sides   (but this is not new )   ua showed some blood and ketones   Results for orders placed or performed in visit on 02/05/20  POCT Urinalysis Dipstick (Automated)  Result Value Ref Range   Color, UA Dark Yellow    Clarity, UA Hazy    Glucose, UA Negative Negative   Bilirubin, UA Negative    Ketones, UA 5 mg/dL    Spec Grav, UA >=1.030 (A) 1.010 - 1.025   Blood, UA 80 Ery/uL    pH, UA 6.0 5.0 - 8.0   Protein, UA Negative Negative   Urobilinogen, UA 0.2 0.2 or 1.0 E.U./dL   Nitrite, UA Negative    Leukocytes, UA Negative Negative   she usually has micro blood    Has had a headache for 4-5 days  This am had some nausea as well (vomited/gagged once)  Has not felt well this week  Neg home covid test  No fever or resp symptoms  Headache (does feel like a migraine)  Either side back and forth  Throbbing  No worse with exertion  Some nausea  No sensitivity to light or sound  Tried imitrex and it did not help  Drinking fluids  She did stop her caffeine - headache before that   Has not tried nsaid   In the past per day 16 oz coffee drink or monster Iced tea a gallon  per day   Does not sleep well  Patient Active Problem List   Diagnosis Date Noted  . Thoracic back pain 11/06/2019  . Meralgia paraesthetica, left 11/06/2019  . Right shoulder pain 05/01/2019  . Hip pain 05/01/2019  . Muscle cramping 05/01/2019  . Bariatric surgery status 05/01/2019  . Urinary frequency 11/23/2018  . Screening mammogram, encounter for 11/23/2018  . B12 deficiency 11/23/2018  . Vertigo 07/17/2018  . Right ankle pain 10/17/2017  . Smoker 06/08/2017  . History of shingles 01/22/2016  . Bursitis of left hip 01/29/2015  . Low back pain 12/26/2013  . Brachioradial pruritus 11/07/2013  . Fatigue 11/07/2013  . Encounter for routine gynecological examination 04/11/2013  . Chest pain 04/11/2013  . UTI (urinary tract infection) 04/11/2013  . Plantar fasciitis 04/11/2013  . Colon cancer screening 02/13/2013  . Screening for breast cancer 04/13/2011  . Special screening for malignant neoplasms, colon 04/13/2011  . Seborrheic dermatitis 04/13/2011  . Routine general medical examination at a health care facility 03/25/2011  . AMENORRHEA 09/18/2008  . Hypothyroidism 08/11/2007  . LOW BACK PAIN, CHRONIC 07/14/2006  . Prediabetes 07/06/2006  . Hyperlipidemia 07/06/2006  . Depression, recurrent ([facility]) 07/06/2006  .  Migraine without aura 07/06/2006  . GERD 07/06/2006   Past Medical History:  Diagnosis Date  . History of depression   . History of gastroesophageal reflux (GERD)   . History of hyperlipidemia   . History of migraine   . History of obesity    lap band surgery  . History of pneumonia 2005   Past Surgical History:  Procedure Laterality Date  . AUGMENTATION MAMMAPLASTY Bilateral 1991  . BREAST ENHANCEMENT SURGERY    . CESAREAN SECTION    . DENTAL SURGERY    . LAPAROSCOPIC GASTRIC BANDING  07/31/07  . LUMBAR DISC SURGERY    . TONGUE SURGERY  03/2005   lesion removal   Social History   Tobacco Use  . Smoking status: Former Smoker    Packs/day: 1.50     Years: 12.00    Pack years: 18.00    Types: Cigarettes    Quit date: 09/25/2017    Years since quitting: 2.3  . Smokeless tobacco: Never Used  . Tobacco comment: Smokes 1 cigarette daily   Vaping Use  . Vaping Use: Never used  Substance Use Topics  . Alcohol use: Yes    Alcohol/week: 0.0 standard drinks    Comment: occasional on monthly basis  . Drug use: No   Family History  Problem Relation Age of Onset  . Pneumonia Father        died  . Lung cancer Father   . Kidney cancer Mother   . Diabetes Mother   . Depression Brother        commited suicide  . Alcohol abuse Brother   . Early menopause Other        family  . Breast cancer Paternal Aunt   . Breast cancer Paternal Aunt   . Colon cancer Neg Hx   . Pancreatic cancer Neg Hx   . Rectal cancer Neg Hx   . Stomach cancer Neg Hx    Allergies  Allergen Reactions  . Meloxicam     REACTION: unspecified  . Sulfonamide Derivatives     REACTION: unspecified   Current Outpatient Medications on File Prior to Visit  Medication Sig Dispense Refill  . atorvastatin (LIPITOR) 10 MG tablet TAKE 1 TABLET BY MOUTH DAILY 90 tablet 2  . diclofenac (VOLTAREN) 75 MG EC tablet Take 1 tablet (75 mg total) by mouth 2 (two) times daily. 60 tablet 3  . escitalopram (LEXAPRO) 20 MG tablet TAKE 1 TABLET BY MOUTH DAILY. 90 tablet 2  . fluticasone (FLONASE) 50 MCG/ACT nasal spray Place 2 sprays into both nostrils daily as needed for allergies or rhinitis.    Marland Kitchen gabapentin (NEURONTIN) 100 MG capsule Take 1 to 3 pills at bedtime by mouth 90 capsule 3  . ketoconazole (NIZORAL) 2 % cream APPLY TO AREAS OF FACE THAT ARE AFFECTED ONCE DAILY 60 g 0  . Magnesium Citrate 125 MG CAPS Take by mouth in the morning and at bedtime.    . SUMAtriptan (IMITREX) 100 MG tablet TAKE 1 TABLET BY MOUTH,MAY REPEAT IN 2 HOURS IF HEADACHE PERSISTS OR RECURS. 27 tablet 2  . Vitamin D, Ergocalciferol, (DRISDOL) 1.25 MG (50000 UNIT) CAPS capsule Take 1 capsule (50,000  Units total) by mouth every 7 (seven) days. 12 capsule 0   No current facility-administered medications on file prior to visit.    Review of Systems  Constitutional: Negative for activity change, appetite change, fatigue, fever and unexpected weight change.  HENT: Negative for congestion, ear pain, rhinorrhea, sinus pressure  and sore throat.   Eyes: Negative for pain, redness and visual disturbance.  Respiratory: Negative for cough, shortness of breath and wheezing.   Cardiovascular: Negative for chest pain and palpitations.  Gastrointestinal: Negative for abdominal pain, blood in stool, constipation and diarrhea.  Endocrine: Negative for polydipsia and polyuria.  Genitourinary: Positive for frequency and urgency. Negative for dysuria, hematuria, pelvic pain, vaginal discharge and vaginal pain.  Musculoskeletal: Negative for arthralgias, back pain and myalgias.  Skin: Negative for pallor and rash.  Allergic/Immunologic: Negative for environmental allergies.  Neurological: Positive for headaches. Negative for dizziness, syncope, facial asymmetry and weakness.  Hematological: Negative for adenopathy. Does not bruise/bleed easily.  Psychiatric/Behavioral: Negative for decreased concentration and dysphoric mood. The patient is not nervous/anxious.        Objective:   Physical Exam Constitutional:      General: She is not in acute distress.    Appearance: She is well-developed and well-nourished. She is obese.  HENT:     Head: Normocephalic and atraumatic.  Eyes:     Extraocular Movements: EOM normal.     Conjunctiva/sclera: Conjunctivae normal.     Pupils: Pupils are equal, round, and reactive to light.  Cardiovascular:     Rate and Rhythm: Normal rate and regular rhythm.     Heart sounds: Normal heart sounds.  Pulmonary:     Effort: Pulmonary effort is normal.     Breath sounds: Normal breath sounds.  Abdominal:     General: Bowel sounds are normal. There is no distension.      Palpations: Abdomen is soft.     Tenderness: There is no abdominal tenderness. There is no rebound.     Comments: No cva tenderness or suprapubic tenderness    Musculoskeletal:        General: No edema.     Cervical back: Normal range of motion and neck supple.  Lymphadenopathy:     Cervical: No cervical adenopathy.  Skin:    Findings: No rash.  Neurological:     Mental Status: She is alert.     Cranial Nerves: No cranial nerve deficit.     Motor: No weakness.     Coordination: Coordination is intact. Romberg sign negative. Coordination normal.     Gait: Gait is intact. Gait normal.  Psychiatric:        Mood and Affect: Mood and affect and mood normal.           Assessment & Plan:   Problem List Items Addressed This Visit      Cardiovascular and Mediastinum   Migraine without aura    Worse in the past week  (none today) Some nausea when head hurts  Disc imp of better hydration  Cut daily caffeine (but can use for headache) Trial of flexeril at bedtime  Update if not starting to improve in a week or if worsening   Would consider a course of prednisone if needed   ? If she has uti /could worsen it also  Will tx that and then update       Relevant Medications   cyclobenzaprine (FLEXERIL) 10 MG tablet     Genitourinary   UTI (urinary tract infection) - Primary    Urine odor and pressure before urinating  Concentrated urine with micro blood (that is baseline)  tx with macrobid pending culture  Enc more fluid (water) inst to update if symptoms worsen before cx returns      Relevant Medications   nitrofurantoin, macrocrystal-monohydrate, (MACROBID) 100  MG capsule   Other Relevant Orders   Urine Culture     Other   Urinary frequency   Relevant Orders   POCT Urinalysis Dipstick (Automated) (Completed)

## 2020-02-06 LAB — URINE CULTURE
MICRO NUMBER:: 11454355
SPECIMEN QUALITY:: ADEQUATE

## 2020-03-26 ENCOUNTER — Ambulatory Visit: Payer: 59

## 2020-03-26 ENCOUNTER — Other Ambulatory Visit: Payer: Self-pay

## 2020-04-01 ENCOUNTER — Other Ambulatory Visit: Payer: Self-pay

## 2020-04-01 ENCOUNTER — Ambulatory Visit (INDEPENDENT_AMBULATORY_CARE_PROVIDER_SITE_OTHER): Payer: 59

## 2020-04-01 DIAGNOSIS — E538 Deficiency of other specified B group vitamins: Secondary | ICD-10-CM

## 2020-04-01 MED ORDER — CYANOCOBALAMIN 1000 MCG/ML IJ SOLN
1000.0000 ug | Freq: Once | INTRAMUSCULAR | Status: AC
  Administered 2020-04-01: 1000 ug via INTRAMUSCULAR

## 2020-04-01 NOTE — Progress Notes (Signed)
Per orders of Dr. Glori Bickers, injection of monthly B12 1000 mcg/ml given by Beatriz Stallion, CMA in left deltoid. Patient tolerated injection well.  Looks like pt will be due for labs next month. She will call to set up B12 labs or regular appt with Dr Glori Bickers.  Sending to Allie Bossier, NP in Dr Marliss Coots absence.

## 2020-04-30 ENCOUNTER — Other Ambulatory Visit: Payer: Self-pay

## 2020-04-30 MED FILL — Atorvastatin Calcium Tab 10 MG (Base Equivalent): ORAL | 30 days supply | Qty: 30 | Fill #0 | Status: AC

## 2020-04-30 MED FILL — Escitalopram Oxalate Tab 20 MG (Base Equiv): ORAL | 30 days supply | Qty: 30 | Fill #0 | Status: AC

## 2020-04-30 MED FILL — Sumatriptan Succinate Tab 100 MG: ORAL | 30 days supply | Qty: 9 | Fill #0 | Status: AC

## 2020-05-05 ENCOUNTER — Telehealth: Payer: Self-pay | Admitting: Family Medicine

## 2020-05-05 DIAGNOSIS — E7800 Pure hypercholesterolemia, unspecified: Secondary | ICD-10-CM

## 2020-05-05 DIAGNOSIS — E039 Hypothyroidism, unspecified: Secondary | ICD-10-CM

## 2020-05-05 DIAGNOSIS — E538 Deficiency of other specified B group vitamins: Secondary | ICD-10-CM

## 2020-05-05 DIAGNOSIS — Z0000 Encounter for general adult medical examination without abnormal findings: Secondary | ICD-10-CM

## 2020-05-05 DIAGNOSIS — R7303 Prediabetes: Secondary | ICD-10-CM

## 2020-05-05 NOTE — Telephone Encounter (Signed)
-----   Message from Ellamae Sia sent at 04/21/2020  2:16 PM EDT ----- Regarding: Lab orders for Tuesday, 4.26.22 Patient is scheduled for CPX labs, please order future labs, Thanks , Karna Christmas

## 2020-05-06 ENCOUNTER — Other Ambulatory Visit (INDEPENDENT_AMBULATORY_CARE_PROVIDER_SITE_OTHER): Payer: 59

## 2020-05-06 ENCOUNTER — Other Ambulatory Visit: Payer: Self-pay

## 2020-05-06 ENCOUNTER — Telehealth (INDEPENDENT_AMBULATORY_CARE_PROVIDER_SITE_OTHER): Payer: 59 | Admitting: Family Medicine

## 2020-05-06 DIAGNOSIS — E7800 Pure hypercholesterolemia, unspecified: Secondary | ICD-10-CM

## 2020-05-06 DIAGNOSIS — E559 Vitamin D deficiency, unspecified: Secondary | ICD-10-CM | POA: Diagnosis not present

## 2020-05-06 DIAGNOSIS — E538 Deficiency of other specified B group vitamins: Secondary | ICD-10-CM | POA: Diagnosis not present

## 2020-05-06 DIAGNOSIS — E039 Hypothyroidism, unspecified: Secondary | ICD-10-CM

## 2020-05-06 DIAGNOSIS — R7303 Prediabetes: Secondary | ICD-10-CM

## 2020-05-06 DIAGNOSIS — Z0000 Encounter for general adult medical examination without abnormal findings: Secondary | ICD-10-CM

## 2020-05-06 LAB — CBC WITH DIFFERENTIAL/PLATELET
Basophils Absolute: 0.1 10*3/uL (ref 0.0–0.1)
Basophils Relative: 1.1 % (ref 0.0–3.0)
Eosinophils Absolute: 0.2 10*3/uL (ref 0.0–0.7)
Eosinophils Relative: 4.9 % (ref 0.0–5.0)
HCT: 39.2 % (ref 36.0–46.0)
Hemoglobin: 13.2 g/dL (ref 12.0–15.0)
Lymphocytes Relative: 49 % — ABNORMAL HIGH (ref 12.0–46.0)
Lymphs Abs: 2.4 10*3/uL (ref 0.7–4.0)
MCHC: 33.8 g/dL (ref 30.0–36.0)
MCV: 86.7 fl (ref 78.0–100.0)
Monocytes Absolute: 0.3 10*3/uL (ref 0.1–1.0)
Monocytes Relative: 6.9 % (ref 3.0–12.0)
Neutro Abs: 1.8 10*3/uL (ref 1.4–7.7)
Neutrophils Relative %: 38.1 % — ABNORMAL LOW (ref 43.0–77.0)
Platelets: 200 10*3/uL (ref 150.0–400.0)
RBC: 4.52 Mil/uL (ref 3.87–5.11)
RDW: 13.4 % (ref 11.5–15.5)
WBC: 4.8 10*3/uL (ref 4.0–10.5)

## 2020-05-06 LAB — COMPREHENSIVE METABOLIC PANEL
ALT: 18 U/L (ref 0–35)
AST: 15 U/L (ref 0–37)
Albumin: 3.2 g/dL — ABNORMAL LOW (ref 3.5–5.2)
Alkaline Phosphatase: 58 U/L (ref 39–117)
BUN: 13 mg/dL (ref 6–23)
CO2: 28 mEq/L (ref 19–32)
Calcium: 8.4 mg/dL (ref 8.4–10.5)
Chloride: 110 mEq/L (ref 96–112)
Creatinine, Ser: 0.83 mg/dL (ref 0.40–1.20)
GFR: 77.05 mL/min (ref >60.00)
Glucose, Bld: 109 mg/dL — ABNORMAL HIGH (ref 70–99)
Potassium: 4.1 mEq/L (ref 3.5–5.1)
Sodium: 142 mEq/L (ref 135–145)
Total Bilirubin: 0.5 mg/dL (ref 0.2–1.2)
Total Protein: 5.1 g/dL — ABNORMAL LOW (ref 6.0–8.3)

## 2020-05-06 LAB — TSH: TSH: 3.47 u[IU]/mL (ref 0.35–4.50)

## 2020-05-06 LAB — VITAMIN D 25 HYDROXY (VIT D DEFICIENCY, FRACTURES): VITD: 15.97 ng/mL — ABNORMAL LOW (ref 30.00–100.00)

## 2020-05-06 LAB — LIPID PANEL
Cholesterol: 212 mg/dL — ABNORMAL HIGH (ref 0–200)
HDL: 45.2 mg/dL (ref >39.00)
LDL Cholesterol: 133 mg/dL — ABNORMAL HIGH (ref 0–99)
NonHDL: 166.86
Total CHOL/HDL Ratio: 5
Triglycerides: 171 mg/dL — ABNORMAL HIGH (ref 0.0–149.0)
VLDL: 34.2 mg/dL (ref 0.0–40.0)

## 2020-05-06 LAB — HEMOGLOBIN A1C: Hgb A1c MFr Bld: 6.2 % (ref 4.6–6.5)

## 2020-05-06 LAB — VITAMIN B12: Vitamin B-12: 693 pg/mL (ref 211–911)

## 2020-05-06 NOTE — Telephone Encounter (Signed)
-----   Message from Ellamae Sia sent at 05/06/2020  7:40 AM EDT ----- Regarding: add a test Wants to add vit d

## 2020-05-06 NOTE — Telephone Encounter (Signed)
I ordered it

## 2020-05-13 ENCOUNTER — Other Ambulatory Visit: Payer: Self-pay

## 2020-05-13 ENCOUNTER — Ambulatory Visit (INDEPENDENT_AMBULATORY_CARE_PROVIDER_SITE_OTHER): Payer: 59 | Admitting: Family Medicine

## 2020-05-13 ENCOUNTER — Encounter: Payer: Self-pay | Admitting: Family Medicine

## 2020-05-13 VITALS — BP 114/68 | HR 101 | Temp 96.9°F | Ht 65.25 in | Wt 225.1 lb

## 2020-05-13 DIAGNOSIS — R350 Frequency of micturition: Secondary | ICD-10-CM

## 2020-05-13 DIAGNOSIS — E559 Vitamin D deficiency, unspecified: Secondary | ICD-10-CM

## 2020-05-13 DIAGNOSIS — Z0000 Encounter for general adult medical examination without abnormal findings: Secondary | ICD-10-CM

## 2020-05-13 DIAGNOSIS — E538 Deficiency of other specified B group vitamins: Secondary | ICD-10-CM | POA: Diagnosis not present

## 2020-05-13 DIAGNOSIS — Z9884 Bariatric surgery status: Secondary | ICD-10-CM

## 2020-05-13 DIAGNOSIS — R7303 Prediabetes: Secondary | ICD-10-CM

## 2020-05-13 DIAGNOSIS — E039 Hypothyroidism, unspecified: Secondary | ICD-10-CM

## 2020-05-13 DIAGNOSIS — R42 Dizziness and giddiness: Secondary | ICD-10-CM

## 2020-05-13 DIAGNOSIS — F339 Major depressive disorder, recurrent, unspecified: Secondary | ICD-10-CM

## 2020-05-13 DIAGNOSIS — G43009 Migraine without aura, not intractable, without status migrainosus: Secondary | ICD-10-CM

## 2020-05-13 DIAGNOSIS — R8290 Unspecified abnormal findings in urine: Secondary | ICD-10-CM

## 2020-05-13 DIAGNOSIS — E7800 Pure hypercholesterolemia, unspecified: Secondary | ICD-10-CM

## 2020-05-13 LAB — POC URINALSYSI DIPSTICK (AUTOMATED)
Bilirubin, UA: NEGATIVE
Blood, UA: 200
Glucose, UA: NEGATIVE
Leukocytes, UA: NEGATIVE
Nitrite, UA: NEGATIVE
Protein, UA: NEGATIVE
Spec Grav, UA: 1.025 (ref 1.010–1.025)
Urobilinogen, UA: 0.2 E.U./dL
pH, UA: 6.5 (ref 5.0–8.0)

## 2020-05-13 MED ORDER — VITAMIN D (ERGOCALCIFEROL) 1.25 MG (50000 UNIT) PO CAPS
50000.0000 [IU] | ORAL_CAPSULE | ORAL | 0 refills | Status: DC
  Filled 2020-05-13: qty 4, 28d supply, fill #0
  Filled 2020-07-03: qty 4, 28d supply, fill #1
  Filled 2020-09-05: qty 4, 28d supply, fill #2

## 2020-05-13 MED ORDER — ATORVASTATIN CALCIUM 20 MG PO TABS
20.0000 mg | ORAL_TABLET | Freq: Every day | ORAL | 3 refills | Status: DC
  Filled 2020-05-13: qty 30, 30d supply, fill #0
  Filled 2020-09-05: qty 30, 30d supply, fill #1
  Filled 2020-12-12: qty 30, 30d supply, fill #2
  Filled 2021-02-19: qty 30, 30d supply, fill #3

## 2020-05-13 NOTE — Assessment & Plan Note (Signed)
Hypothyroidism  Pt has no clinical changes No change in energy level/ hair or skin/ edema and no tremor  (baseline fatigue with no change) Lab Results  Component Value Date   TSH 3.47 05/06/2020    No medications at this time

## 2020-05-13 NOTE — Assessment & Plan Note (Signed)
Very low D at 15.9  Px ergocalciferol 50,000 iu weekly for 12 weeks Also D3 otc 4000 iu daily  Re check with next lab in 2 mo

## 2020-05-13 NOTE — Assessment & Plan Note (Signed)
Lab Results  Component Value Date   HGBA1C 6.2 05/06/2020   disc imp of low glycemic diet and wt loss to prevent DM2

## 2020-05-13 NOTE — Assessment & Plan Note (Signed)
Lab Results  Component Value Date   [id number] 606 05/06/2020   Good with monthly injection and daily oral supplementation

## 2020-05-13 NOTE — Patient Instructions (Addendum)
Try to get most of your carbohydrates from produce (with the exception of white potatoes)  Eat less bread/pasta/rice/snack foods/cereals/sweets and other items from the middle of the grocery store (processed carbs)  Keep up the lean protein intake   Avoid red meat/ fried foods/ egg yolks/ fatty breakfast meats/ butter, cheese and high fat dairy/ and shellfish   Take the ergocalciferol (vit D) weekly for 12 weeks  Also take 4000 iu daily of D3 over the counter   Increase lipitor to 20 mg daily  Let's re check cholesterol in 2 months and vitamin D  Let's get a urine sample on the way out Keep drinking water

## 2020-05-13 NOTE — Assessment & Plan Note (Signed)
Continue gabapentin for prevention and flexeril fo rescue Enc good fluid intake and lifestyle habits

## 2020-05-13 NOTE — Assessment & Plan Note (Signed)
Does well as long as she takes lexapro  Mood is fairly good and motivation better

## 2020-05-13 NOTE — Progress Notes (Signed)
Subjective:    Patient ID: Evelyn Shepherd, female    DOB: February 22, 1960, 60 y.o.   MRN: [id number]  This visit occurred during the SARS-CoV-2 public health emergency.  Safety protocols were in place, including screening questions prior to the visit, additional usage of staff PPE, and extensive cleaning of exam room while observing appropriate contact time as indicated for disinfecting solutions.    HPI Here for health maintenance exam and to review chronic medical problems    Wt Readings from Last 3 Encounters:  05/13/20 225 lb 1 oz (102.1 kg)  02/05/20 241 lb 3 oz (109.4 kg)  12/28/19 235 lb (106.6 kg)   37.17 kg/m   Weight is down  Working on it  Eating like she should - less bread and fat and sweets   No exercise currently / was going to the gym for a while  Has to care for mother in [place] a lot  (sister there as well)    History of bariatric surgery   covid status - has not had covid   Pap 4/15 neg with neg HPV Declines pap/exam  No gyn problems but has L sided back pain   Wonders about another uti  Frequent urination   Mammogram 1/21 Self breast exam- no lumps   Flu shot 10/21 Td 3/18 Had shingrix vaccines    Colonoscopy 1/17  BP Readings from Last 3 Encounters:  05/13/20 114/68  02/05/20 116/64  12/28/19 98/60   Pulse Readings from Last 3 Encounters:  05/13/20 (!) 101  02/05/20 67  12/28/19 78   Migraine Takes flexeril for rescue  Still has them  Mild headache today  Drinks a lot of fluids   Hypothyroidism  Pt has no clinical changes No change in energy level/ hair or skin/ edema and no tremor Lab Results  Component Value Date   TSH 3.47 05/06/2020     No levothyroxine currently   b12 def Lab Results  Component Value Date   VITAMINB12 693 05/06/2020  monthly injection and oral supplementation   Takes lexapro for depression  Still does well with that   Hyperlipidemia  Lab Results  Component Value Date   CHOL 212 (H) 05/06/2020    CHOL 173 02/28/2019   CHOL 214 (H) 11/23/2018   Lab Results  Component Value Date   HDL 45.20 05/06/2020   HDL 51.00 02/28/2019   HDL 64.10 11/23/2018   Lab Results  Component Value Date   LDLCALC 133 (H) 05/06/2020   LDLCALC 92 02/28/2019   LDLCALC 133 (H) 11/23/2018   Lab Results  Component Value Date   TRIG 171.0 (H) 05/06/2020   TRIG 153.0 (H) 02/28/2019   TRIG 81.0 11/23/2018   Lab Results  Component Value Date   CHOLHDL 5 05/06/2020   CHOLHDL 3 02/28/2019   CHOLHDL 3 11/23/2018   Lab Results  Component Value Date   LDLDIRECT 187.8 10/22/2011   LDLDIRECT 157.7 03/25/2011   LDLDIRECT 164.3 01/11/2008   Atorvastatin 10 mg daily Diet is better  Mother has high cholesterol    Prediabetes Lab Results  Component Value Date   HGBA1C 6.2 05/06/2020  prev 5.8 Has family h/o diabetes -mother  Glucose 109    Vit d def  Today 15.9 -low  Taking vit D for about 2 months now  No energy -? If this is the cause        Review of Systems     Objective:   Physical Exam  Assessment & Plan:

## 2020-05-13 NOTE — Assessment & Plan Note (Signed)
Disc goals for lipids and reasons to control them Rev last labs with pt Rev low sat fat diet in detail  LDL is 133 with atorvastatin 10 mg and good diet Plan to inc to 20 mg daily  inst to update if any side eff Re check lipids in 2 mo

## 2020-05-13 NOTE — Assessment & Plan Note (Signed)
Continue to watch B12 and D levels  Noted protein /albumin levels low in labs Disc protein intake and check ua

## 2020-05-13 NOTE — Assessment & Plan Note (Signed)
Reviewed health habits including diet and exercise and skin cancer prevention Reviewed appropriate screening tests for age  Also reviewed health mt list, fam hx and immunization status , as well as social and family history   See HPI Labs reviewed  Commended wt loss  Declines covid imm Declines pap Mammogram is due-pt plans to schedule this /reminder given Colonoscopy utd

## 2020-05-13 NOTE — Assessment & Plan Note (Signed)
ua with microhematuria- baseline, otherwise clear  Pending urine cx  Good fluid intake

## 2020-05-14 LAB — URINE CULTURE
MICRO NUMBER:: 11843718
SPECIMEN QUALITY:: ADEQUATE

## 2020-07-03 ENCOUNTER — Other Ambulatory Visit: Payer: Self-pay

## 2020-07-21 ENCOUNTER — Other Ambulatory Visit: Payer: 59

## 2020-07-21 ENCOUNTER — Telehealth: Payer: Self-pay

## 2020-07-21 NOTE — Telephone Encounter (Signed)
[facility] Night - Client Nonclinical Telephone Record AccessNurse Client [facility] Night - Client Client Site [place] Physician Loura Pardon - MD Contact Type Call Who Is Calling Patient / Member / Family / Caregiver Caller Name Baker Phone Number [phone] Patient Name Evelyn Shepherd Patient DOB [birth year] Call Type Message Only Information Provided Reason for Call Request to [place] Appointment Initial Comment Caller has appointment at 8 that she wants to cancel due to something coming up. Patient request to speak to RN No Additional Comment Office hours provided. Triage refused. Disp. Time Disposition Final User 07/21/2020 7:29:58 AM General Information Provided Yes Marshell Garfinkel Call Closed By: Marshell Garfinkel Transaction Date/Time: 07/21/2020 7:27:52 AM (ET)

## 2020-07-23 ENCOUNTER — Other Ambulatory Visit: Payer: 59

## 2020-07-24 ENCOUNTER — Other Ambulatory Visit (INDEPENDENT_AMBULATORY_CARE_PROVIDER_SITE_OTHER): Payer: 59

## 2020-07-24 ENCOUNTER — Other Ambulatory Visit: Payer: Self-pay

## 2020-07-24 ENCOUNTER — Telehealth: Payer: Self-pay | Admitting: Radiology

## 2020-07-24 DIAGNOSIS — E559 Vitamin D deficiency, unspecified: Secondary | ICD-10-CM

## 2020-07-24 DIAGNOSIS — E7800 Pure hypercholesterolemia, unspecified: Secondary | ICD-10-CM

## 2020-07-24 DIAGNOSIS — R5382 Chronic fatigue, unspecified: Secondary | ICD-10-CM

## 2020-07-24 DIAGNOSIS — E538 Deficiency of other specified B group vitamins: Secondary | ICD-10-CM

## 2020-07-24 LAB — LIPID PANEL
Cholesterol: 176 mg/dL (ref 0–200)
HDL: 49.9 mg/dL (ref >39.00)
LDL Cholesterol: 101 mg/dL — ABNORMAL HIGH (ref 0–99)
NonHDL: 126.28
Total CHOL/HDL Ratio: 4
Triglycerides: 128 mg/dL (ref 0.0–149.0)
VLDL: 25.6 mg/dL (ref 0.0–40.0)

## 2020-07-24 LAB — VITAMIN D 25 HYDROXY (VIT D DEFICIENCY, FRACTURES): VITD: 23.68 ng/mL — ABNORMAL LOW (ref 30.00–100.00)

## 2020-07-24 LAB — AST: AST: 15 U/L (ref 0–37)

## 2020-07-24 LAB — ALT: ALT: 18 U/L (ref 0–35)

## 2020-07-24 NOTE — Telephone Encounter (Signed)
I ordered them if it is not too late

## 2020-07-25 ENCOUNTER — Other Ambulatory Visit (INDEPENDENT_AMBULATORY_CARE_PROVIDER_SITE_OTHER): Payer: 59

## 2020-07-25 DIAGNOSIS — E538 Deficiency of other specified B group vitamins: Secondary | ICD-10-CM

## 2020-07-25 DIAGNOSIS — R5382 Chronic fatigue, unspecified: Secondary | ICD-10-CM | POA: Diagnosis not present

## 2020-07-25 LAB — IRON: Iron: 86 ug/dL (ref 42–145)

## 2020-07-25 LAB — VITAMIN B12: Vitamin B-12: 552 pg/mL (ref 211–911)

## 2020-08-08 IMAGING — CT CT RENAL STONE PROTOCOL
2 of 4 series · 16 of 46 positions shown, 18 images · non-contrast
Comparison: 09/14/2017

CLINICAL DATA: Urinary frequency and history of hematuria

EXAM:
CT ABDOMEN AND PELVIS WITHOUT CONTRAST
TECHNIQUE: Multidetector CT imaging of the abdomen and pelvis was performed
following the standard protocol without IV contrast.

[Series 2: stone full standard · axial · 0.83mm/px · z∈[-991,-611]mm · 13 of 84 slices shown, 15 images]
[im 4/84  soft-tissue]
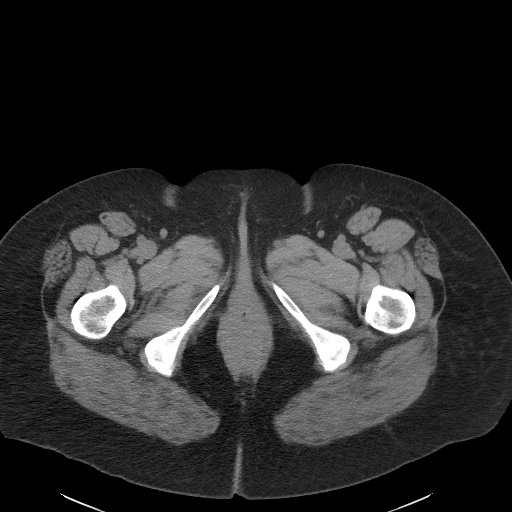
[im 4/84  bone]
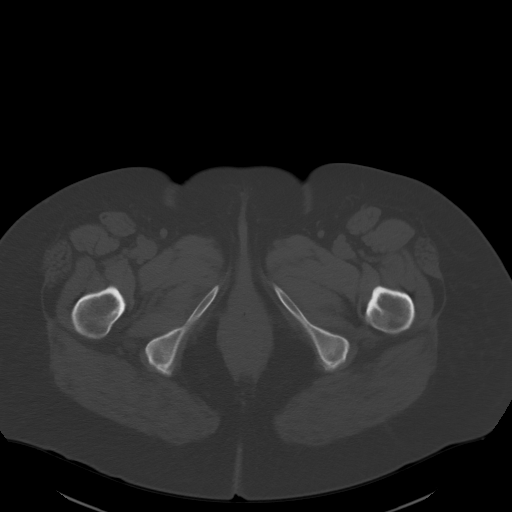
[im 10/84  soft-tissue]
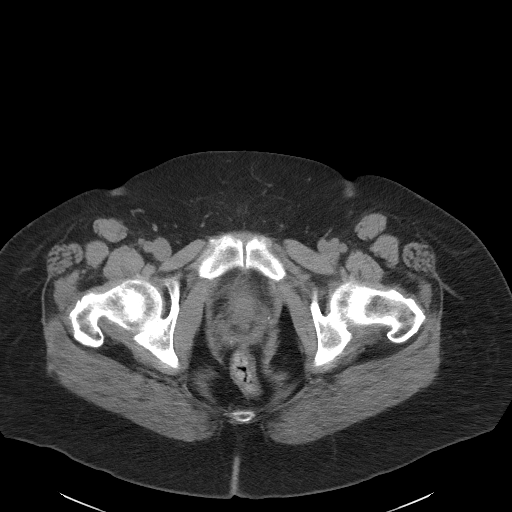
[im 17/84  soft-tissue]
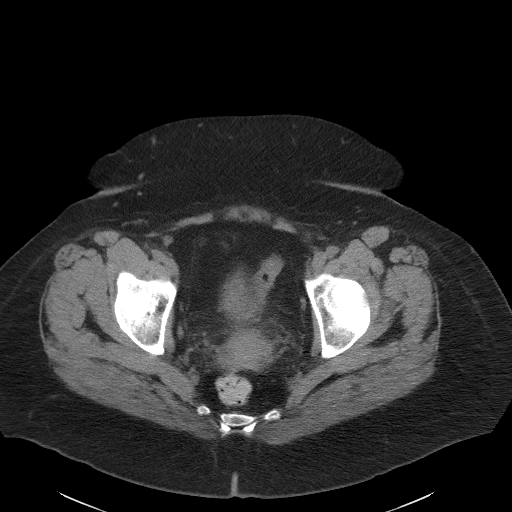
[im 24/84  soft-tissue]
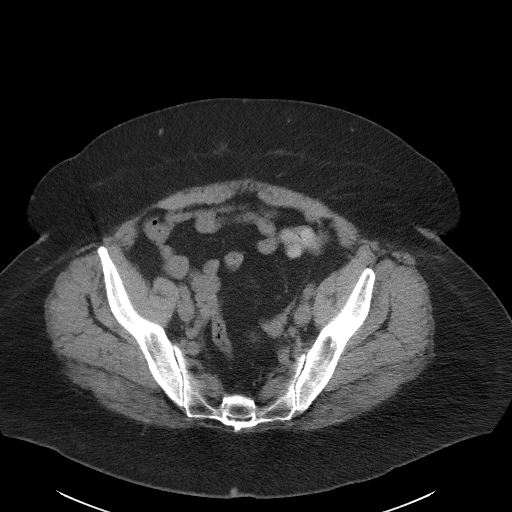
[im 30/84  soft-tissue]
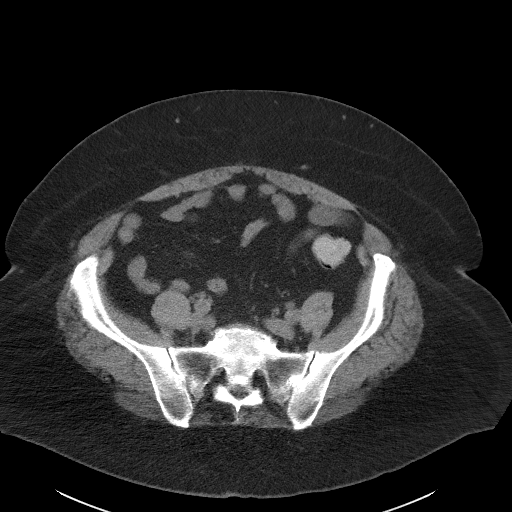
[im 37/84  soft-tissue]
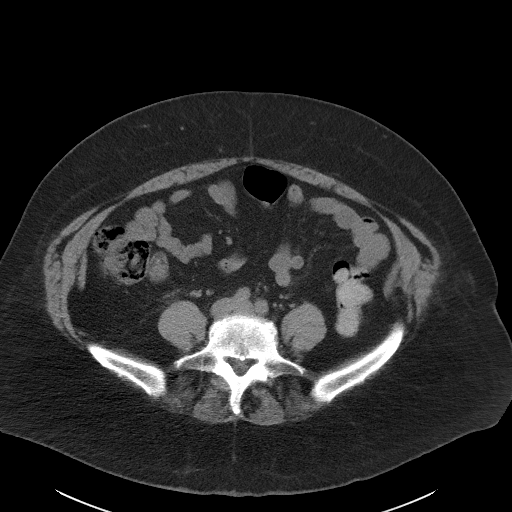
[im 44/84  soft-tissue]
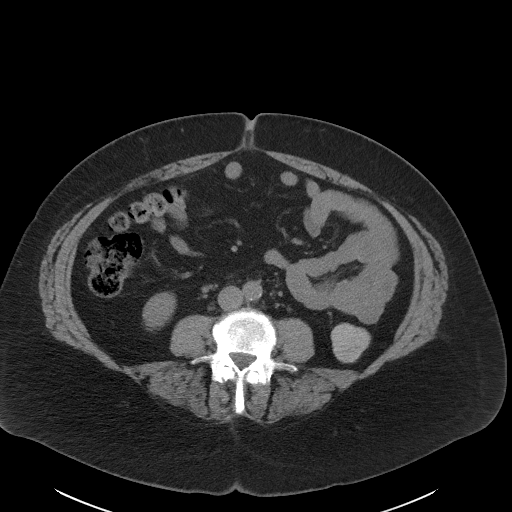
[im 47/84  soft-tissue]
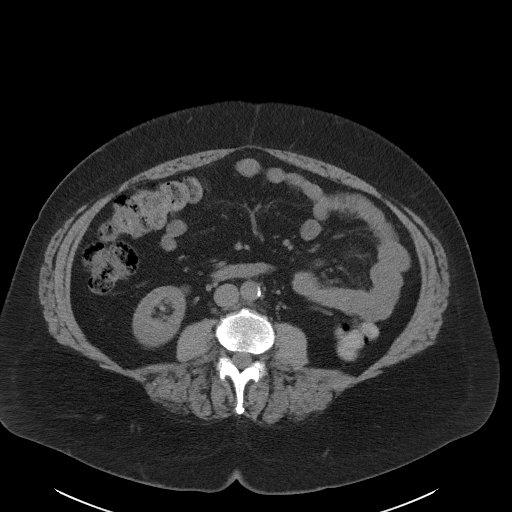
[im 54/84  soft-tissue]
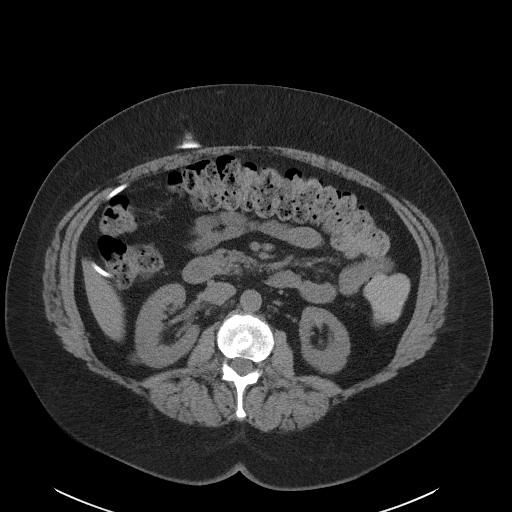
[im 54/84  bone]
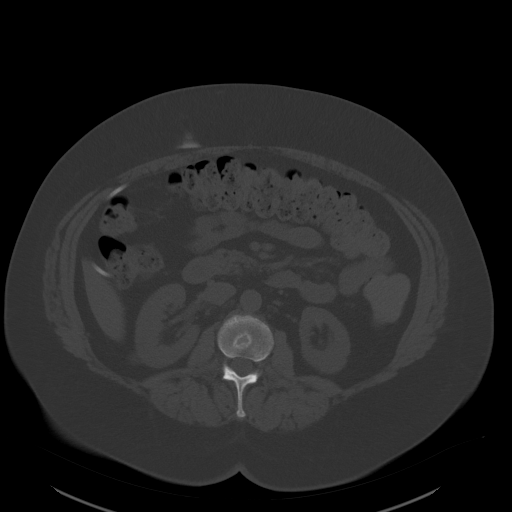
[im 60/84  soft-tissue]
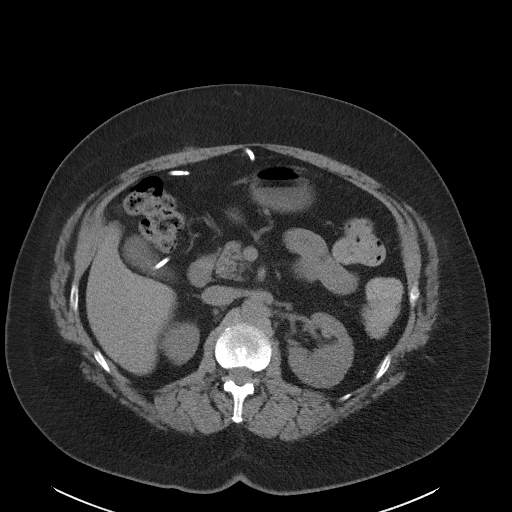
[im 67/84  soft-tissue]
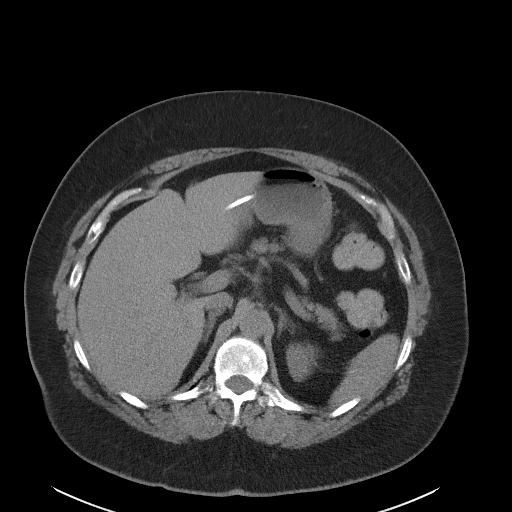
[im 74/84  soft-tissue]
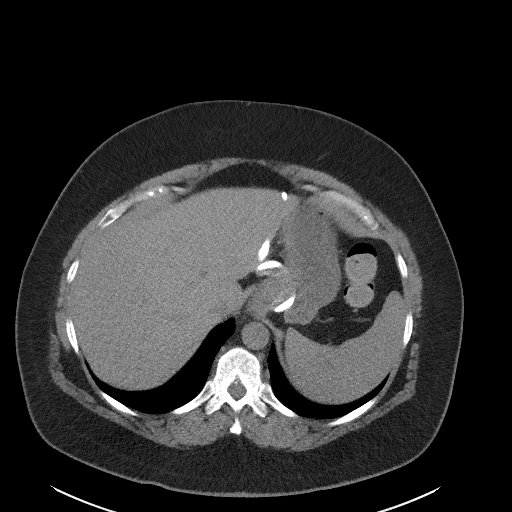
[im 80/84  soft-tissue]
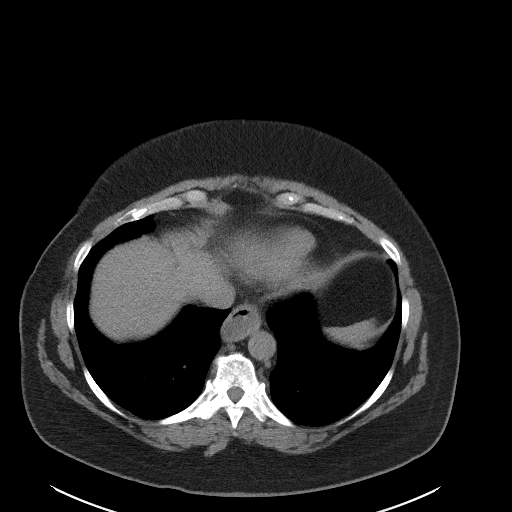

[Series 5: coronal · coronal · 0.78mm/px · 3 of 165 slices shown]
[im 55/165  soft-tissue]
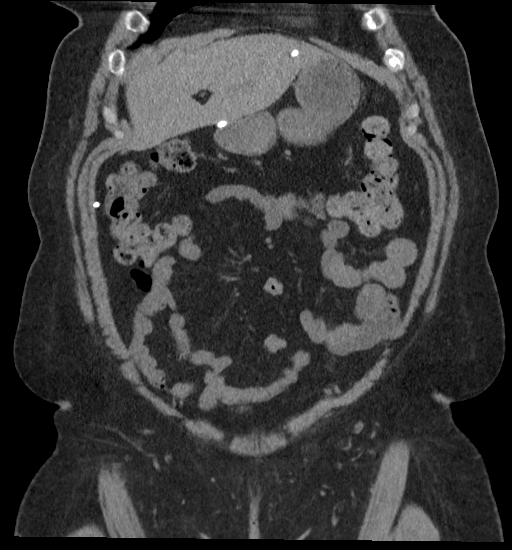
[im 73/165  soft-tissue]
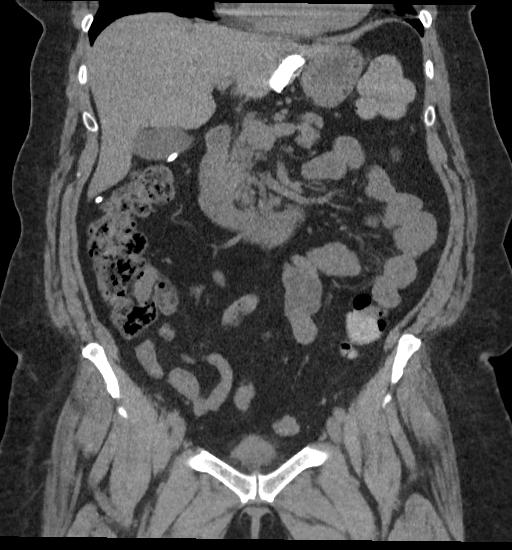
[im 92/165  soft-tissue]
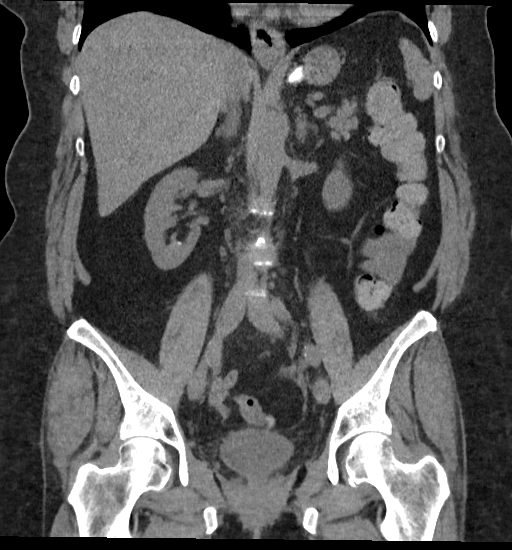

[16 of 46 positions shown; findings below may reference images not displayed]

FINDINGS: Lower chest: No acute abnormality.

Hepatobiliary: No focal liver abnormality is seen. No gallstones,
gallbladder wall thickening, or biliary dilatation.

Pancreas: Unremarkable. No pancreatic ductal dilatation or
surrounding inflammatory changes.

Spleen: Normal in size without focal abnormality.

Adrenals/Urinary Tract: Adrenal glands are within normal limits
bilaterally. Kidneys are well visualized. No renal calculi or
obstructive changes are seen. Exophytic hypodensities are noted
within the right kidney consistent with small cysts. Bladder is
partially distended. Mild inflammatory changes in the soft tissue
surrounding the bladder are seen. This could be related to mild
cystitis. No other focal abnormality is noted. 2

Stomach/Bowel: No obstructive or inflammatory changes of the colon
are seen. The appendix is within normal limits. No small bowel
abnormality is noted. Gastric lap band is seen in satisfactory
position. Fluid is noted within the distal esophagus.

Vascular/Lymphatic: Aortic atherosclerosis. No enlarged abdominal or
pelvic lymph nodes.

Reproductive: Uterus and bilateral adnexa are unremarkable.

Other: No abdominal wall hernia or abnormality. No abdominopelvic
ascites.

Musculoskeletal: No acute or significant osseous findings.
IMPRESSION: Mild inflammatory changes surrounding the inferior aspect of the
urinary bladder. This could represent some mild cystitis.

Gastric lap band in satisfactory position.

Small right renal cysts.

## 2020-09-05 ENCOUNTER — Other Ambulatory Visit: Payer: Self-pay

## 2020-09-05 MED FILL — Escitalopram Oxalate Tab 20 MG (Base Equiv): ORAL | 30 days supply | Qty: 30 | Fill #1 | Status: AC

## 2020-09-05 MED FILL — Sumatriptan Succinate Tab 100 MG: ORAL | 30 days supply | Qty: 9 | Fill #1 | Status: AC

## 2020-09-09 ENCOUNTER — Ambulatory Visit: Payer: 59

## 2020-09-11 ENCOUNTER — Ambulatory Visit: Payer: 59

## 2020-09-14 ENCOUNTER — Other Ambulatory Visit: Payer: Self-pay

## 2020-09-14 ENCOUNTER — Ambulatory Visit (INDEPENDENT_AMBULATORY_CARE_PROVIDER_SITE_OTHER): Payer: 59

## 2020-09-14 ENCOUNTER — Encounter: Payer: Self-pay | Admitting: Emergency Medicine

## 2020-09-14 ENCOUNTER — Ambulatory Visit
Admission: EM | Admit: 2020-09-14 | Discharge: 2020-09-14 | Disposition: A | Discharge status: Home / Self Care | Payer: 59 | Attending: Emergency Medicine | Admitting: Emergency Medicine

## 2020-09-14 DIAGNOSIS — J22 Unspecified acute lower respiratory infection: Secondary | ICD-10-CM | POA: Diagnosis not present

## 2020-09-14 DIAGNOSIS — R0602 Shortness of breath: Secondary | ICD-10-CM

## 2020-09-14 MED ORDER — DOXYCYCLINE HYCLATE 100 MG PO CAPS: 100.0000 mg | ORAL_CAPSULE | Freq: Two times a day (BID) | ORAL | 0 refills | Status: DC

## 2020-09-14 MED ORDER — PREDNISONE 10 MG PO TABS: ORAL_TABLET | ORAL | 0 refills | Status: AC

## 2020-09-14 MED ORDER — ALBUTEROL SULFATE HFA 108 (90 BASE) MCG/ACT IN AERS
1.0000 | INHALATION_SPRAY | Freq: Four times a day (QID) | RESPIRATORY_TRACT | 0 refills | Status: DC | PRN
Start: 2020-09-14 — End: 2023-02-27

## 2020-09-14 NOTE — ED Provider Notes (Signed)
CHIEF COMPLAINT:   Chief Complaint  Patient presents with   Shortness of Breath   Cough     SUBJECTIVE/HPI:   Shortness of Breath Associated symptoms: cough   Cough Associated symptoms: shortness of breath   A very pleasant 60 y.o.Female presents today with shortness of breath and cough for the last 2 weeks.  Patient reports that her symptoms are worse when she lays flat.  She reports a "squeaking sound" when laying on her left side.  History of pneumonia.  Has been treating the cough with cough drops without relief.  Patient states that she was quite ill in May and thinks that she had COVID, but states that she never had a positive test.  Patient does not report any chest pain, palpitations, visual changes, weakness, tingling, headache, nausea, vomiting, diarrhea, fever, chills.   has a past medical history of History of depression, History of gastroesophageal reflux (GERD), History of hyperlipidemia, History of migraine, History of obesity, and History of pneumonia (2005).  ROS:  Review of Systems  Respiratory:  Positive for cough and shortness of breath.   See Subjective/HPI Medications, Allergies and Problem List personally reviewed in Epic today OBJECTIVE:   Vitals:   09/14/20 1530  BP: 93/65  Pulse: 86  Resp: 16  Temp: 98.1 F (36.7 C)  SpO2: 96%    Physical Exam   General: Appears well-developed and well-nourished. No acute distress.  HEENT Head: Normocephalic and atraumatic. Ears: Hearing grossly intact, no drainage or visible deformity.  Eyes: Conjunctivae and EOM are normal. No eye drainage or scleral icterus bilaterally.  Neck: Normal range of motion, neck is supple. Cardiovascular: Normal rate. Regular rhythm; no murmurs, gallops, or rubs.  Pulm/Chest: No respiratory distress.  Mild rhonchi to bilateral lower lobes.  Bilateral upper lobes CTA. Neurological: Alert and oriented to person, place, and time.  Skin: Skin is warm and dry.  No rashes, lesions,  abrasions or bruising noted to skin.   Psychiatric: Normal mood, affect, behavior, and thought content.   Vital signs and nursing note reviewed.   Patient stable and cooperative with examination. PROCEDURES:    LABS/X-RAYS/EKG/MEDS:   No results found for any visits on 09/14/20.  MEDICAL DECISION MAKING:   Patient presents with shortness of breath and cough for the last 2 weeks.  Patient reports that her symptoms are worse when she lays flat.  She reports a "squeaking sound" when laying on her left side.  History of pneumonia.  Has been treating the cough with cough drops without relief.  Patient states that she was quite ill in May and thinks that she had COVID, but states that she never had a positive test.  Patient does not report any chest pain, palpitations, visual changes, weakness, tingling, headache, nausea, vomiting, diarrhea, fever, chills.  Chart review completed.  Given symptoms along with assessment findings, likely lower respiratory tract infection.  Chest x-ray reveals some reticular opacities to the right mid lobe concerning for infection.  As read by me, overread pending.  Rx'd doxycycline, albuterol inhaler, prednisone to the patient's preferred pharmacy and advised of at home treatment and care as outlined in her AVS to include Mucinex, Tylenol versus ibuprofen, rest and increased fluid intake.  Return to clinic for new onset fever, difficulty breathing, chest pain, symptoms lasting longer than 3 to 4 weeks or bloody sputum.  Patient verbalized understanding and agreed with treatment plan.  Patient stable upon discharge. ASSESSMENT/PLAN:  1. Lower respiratory tract infection - DG Chest 2 View;  Standing - DG Chest 2 View - doxycycline (VIBRAMYCIN) 100 MG capsule; Take 1 capsule (100 mg total) by mouth 2 (two) times daily.  Dispense: 20 capsule; Refill: 0 - albuterol (VENTOLIN HFA) 108 (90 Base) MCG/ACT inhaler; Inhale 1-2 puffs into the lungs every 6 (six) hours as needed  (cough).  Dispense: 6.7 g; Refill: 0 - predniSONE (DELTASONE) 10 MG tablet; Take 6 tablets (60 mg total) by mouth daily for 1 day, THEN 5 tablets (50 mg total) daily for 1 day, THEN 4 tablets (40 mg total) daily for 1 day, THEN 3 tablets (30 mg total) daily for 1 day, THEN 2 tablets (20 mg total) daily for 1 day, THEN 1 tablet (10 mg total) daily for 1 day.  Dispense: 21 tablet; Refill: 0  Instructions about new medications and side effects provided.  Plan:   Discharge Instructions      Take prednisone, doxycycline and use your albuterol inhaler as prescribed.  Rest, push lots of fluids (especially water), and utilize supportive care for symptoms. You may take take acetaminophen (Tylenol) every 4-6 hours or ibuprofen every 6-8 hours for muscle pain, joint pain, headaches. Mucinex (guaifenesin) may be taken over the counter for cough as needed and can loosen phlegm. Please read the instructions and take as directed. Saline nasal sprays to rinse congestion can help as well. Warm tea with lemon and honey can sooth sore throat and cough, as can cough drops.  Take Mucinex for cough.   Return to clinic for new-onset fever, difficulty breathing, chest pain, symptoms lasting >3 to 4 weeks, or bloody sputum.          Evelyn Shepherd, [place] 09/14/20 [phone]

## 2020-09-14 NOTE — ED Triage Notes (Signed)
Patient presents to Urgent Care with complaints of cough and SOB x 2 weeks. Pt states worse with laying flat. She also describes a "squeaking sound" when laying on her left side. Hx if pneumonia. Treating cough with cough drops with no relief.   Denies fever.

## 2020-09-14 NOTE — Discharge Instructions (Addendum)
Take prednisone, doxycycline and use your albuterol inhaler as prescribed.  Rest, push lots of fluids (especially water), and utilize supportive care for symptoms. You may take take acetaminophen (Tylenol) every 4-6 hours or ibuprofen every 6-8 hours for muscle pain, joint pain, headaches. Mucinex (guaifenesin) may be taken over the counter for cough as needed and can loosen phlegm. Please read the instructions and take as directed. Saline nasal sprays to rinse congestion can help as well. Warm tea with lemon and honey can sooth sore throat and cough, as can cough drops.  Take Mucinex for cough.   Return to clinic for new-onset fever, difficulty breathing, chest pain, symptoms lasting >3 to 4 weeks, or bloody sputum.

## 2020-09-17 ENCOUNTER — Ambulatory Visit: Payer: 59

## 2020-09-23 ENCOUNTER — Ambulatory Visit: Payer: 59

## 2020-10-02 ENCOUNTER — Other Ambulatory Visit: Payer: Self-pay | Admitting: Family Medicine

## 2020-10-02 MED FILL — Escitalopram Oxalate Tab 20 MG (Base Equiv): ORAL | 30 days supply | Qty: 30 | Fill #2 | Status: CN

## 2020-10-02 MED FILL — Sumatriptan Succinate Tab 100 MG: ORAL | 30 days supply | Qty: 9 | Fill #2 | Status: CN

## 2020-10-03 ENCOUNTER — Other Ambulatory Visit: Payer: Self-pay

## 2020-10-03 ENCOUNTER — Other Ambulatory Visit: Payer: Self-pay | Admitting: Family Medicine

## 2020-10-03 MED FILL — Sumatriptan Succinate Tab 100 MG: ORAL | 30 days supply | Qty: 9 | Fill #2 | Status: AC

## 2020-10-03 MED FILL — Escitalopram Oxalate Tab 20 MG (Base Equiv): ORAL | 30 days supply | Qty: 30 | Fill #2 | Status: AC

## 2020-10-06 ENCOUNTER — Other Ambulatory Visit: Payer: Self-pay

## 2020-10-06 MED FILL — Ergocalciferol Cap 1.25 MG (50000 Unit): ORAL | 28 days supply | Qty: 4 | Fill #0 | Status: AC

## 2020-11-04 ENCOUNTER — Other Ambulatory Visit: Payer: Self-pay

## 2020-11-04 ENCOUNTER — Ambulatory Visit: Payer: 59 | Admitting: Nurse Practitioner

## 2020-11-04 ENCOUNTER — Encounter: Payer: Self-pay | Admitting: Nurse Practitioner

## 2020-11-04 ENCOUNTER — Ambulatory Visit (INDEPENDENT_AMBULATORY_CARE_PROVIDER_SITE_OTHER)
Admission: RE | Admit: 2020-11-04 | Discharge: 2020-11-04 | Disposition: A | Discharge status: Home / Self Care | Payer: 59 | Source: Ambulatory Visit | Attending: Nurse Practitioner | Admitting: Nurse Practitioner

## 2020-11-04 VITALS — BP 118/74 | HR 74 | Temp 96.7°F | Resp 14 | Ht 65.25 in | Wt 234.2 lb

## 2020-11-04 DIAGNOSIS — M546 Pain in thoracic spine: Secondary | ICD-10-CM

## 2020-11-04 DIAGNOSIS — Z23 Encounter for immunization: Secondary | ICD-10-CM

## 2020-11-04 DIAGNOSIS — M545 Low back pain, unspecified: Secondary | ICD-10-CM | POA: Diagnosis not present

## 2020-11-04 DIAGNOSIS — E538 Deficiency of other specified B group vitamins: Secondary | ICD-10-CM

## 2020-11-04 LAB — BASIC METABOLIC PANEL
BUN: 15 mg/dL (ref 6–23)
CO2: 28 mEq/L (ref 19–32)
Calcium: 9.2 mg/dL (ref 8.4–10.5)
Chloride: 107 mEq/L (ref 96–112)
Creatinine, Ser: 0.77 mg/dL (ref 0.40–1.20)
GFR: 84.01 mL/min (ref >60.00)
Glucose, Bld: 109 mg/dL — ABNORMAL HIGH (ref 70–99)
Potassium: 4.3 mEq/L (ref 3.5–5.1)
Sodium: 140 mEq/L (ref 135–145)

## 2020-11-04 LAB — CBC
HCT: 41.5 % (ref 36.0–46.0)
Hemoglobin: 13.7 g/dL (ref 12.0–15.0)
MCHC: 33.1 g/dL (ref 30.0–36.0)
MCV: 88.6 fl (ref 78.0–100.0)
Platelets: 202 10*3/uL (ref 150.0–400.0)
RBC: 4.69 Mil/uL (ref 3.87–5.11)
RDW: 13.9 % (ref 11.5–15.5)
WBC: 5.1 10*3/uL (ref 4.0–10.5)

## 2020-11-04 LAB — SEDIMENTATION RATE: Sed Rate: 5 mm/hr (ref 0–30)

## 2020-11-04 MED ORDER — CYANOCOBALAMIN 1000 MCG/ML IJ SOLN
1000.0000 ug | Freq: Once | INTRAMUSCULAR | Status: AC
  Administered 2020-11-04: 1000 ug via INTRAMUSCULAR

## 2020-11-04 NOTE — Assessment & Plan Note (Signed)
Requested a picture of her lumbar spine she does have tenderness there no neuro symptoms currently pending lab results and x-ray results.  Was going to see sports medicine at 1 point during her appointment.  Started having chest pain and vomiting and was not evaluated for that.

## 2020-11-04 NOTE — Assessment & Plan Note (Signed)
Patient complaining of bilateral thoracic pain with bony tenderness and muscular tenderness no personal history of autoimmune disorders but does have family history of same.  We will get x-ray of upper and lower back.  Along with some blood work pending lab results

## 2020-11-04 NOTE — Patient Instructions (Signed)
Nice to see you  Will be in touch with xray results and labs

## 2020-11-04 NOTE — Progress Notes (Signed)
Acute Office Visit  Subjective:    Patient ID: Evelyn Shepherd, female    DOB: 03-30-1960, 60 y.o.   MRN: [id number]  Chief Complaint  Patient presents with   Back Pain    Started a few weeks ago, maybe a few months, has gotten worse. Mainly bothers patient more at night. Patient is not able to describe what kind of pain "just described as a pain." Upper back and between shoulder blades and up and to the side of both back areas. No injury. Today some better. Has tried Skelaxin, massage, BioFreeze, Tylenol and Ibuprofen.     Patient is in today for Back pain   Started approx weeks to months ago. Started when she was sitting around. Constant ache. Tender on the back to palpation. No known injury Mainly upper back.   Has tried skelaxin and flexeril and a massager without great relief. Does have a sister with auto immune diseases.  Former smoker 18 pack years  2 years 0.5 ppd  Past Medical History:  Diagnosis Date   History of depression    History of gastroesophageal reflux (GERD)    History of hyperlipidemia    History of migraine    History of obesity    lap band surgery   History of pneumonia 2005    Past Surgical History:  Procedure Laterality Date   AUGMENTATION MAMMAPLASTY Bilateral 1991   BREAST ENHANCEMENT SURGERY     CESAREAN SECTION     DENTAL SURGERY     LAPAROSCOPIC GASTRIC BANDING  07/31/07   LUMBAR [place] SURGERY  03/2005   lesion removal    Family History  Problem Relation Age of Onset   Pneumonia Father        died   Lung cancer Father    Kidney cancer Mother    Diabetes Mother    Depression Brother        commited suicide   Alcohol abuse Brother    Early menopause Other        family   Breast cancer Paternal Aunt    Breast cancer Paternal Aunt    Colon cancer Neg Hx    Pancreatic cancer Neg Hx    Rectal cancer Neg Hx    Stomach cancer Neg Hx     Social History   Socioeconomic History   Marital status: Married     Spouse name: Not on file   Number of children: Not on file   Years of education: Not on file   Highest education level: Not on file  Occupational History   Not on file  Tobacco Use   Smoking status: Former    Packs/day: 1.50    Years: 12.00    Pack years: 18.00    Types: Cigarettes    Quit date: 09/25/2017    Years since quitting: 3.1   Smokeless tobacco: Never   Tobacco comments:    Smokes 1 cigarette daily   Vaping Use   Vaping Use: Never used  Substance and Sexual Activity   Alcohol use: Yes    Alcohol/week: 0.0 standard drinks    Comment: occasional on monthly basis   Drug use: No   Sexual activity: Yes    Partners: Female    Birth control/protection: Post-menopausal  Other Topics Concern   Not on file  Social History Narrative   Not on file   Social Determinants of Health   Financial Resource Strain: Not on file  Food Insecurity:  Not on file  Transportation Needs: Not on file  Physical Activity: Not on file  Stress: Not on file  Social Connections: Not on file  Intimate Partner Violence: Not on file    Outpatient Medications Prior to Visit  Medication Sig Dispense Refill   albuterol (VENTOLIN HFA) 108 (90 Base) MCG/ACT inhaler Inhale 1-2 puffs into the lungs every 6 (six) hours as needed (cough). 6.7 g 0   atorvastatin (LIPITOR) 20 MG tablet Take 1 tablet (20 mg total) by mouth daily. 90 tablet 3   escitalopram (LEXAPRO) 20 MG tablet TAKE 1 TABLET BY MOUTH DAILY. 90 tablet 2   fluticasone (FLONASE) 50 MCG/ACT nasal spray Place 2 sprays into both nostrils daily as needed for allergies or rhinitis.     ketoconazole (NIZORAL) 2 % cream APPLY TO AREAS OF FACE THAT ARE AFFECTED ONCE DAILY 60 g 0   Magnesium Citrate 125 MG CAPS Take by mouth in the morning and at bedtime.     SUMAtriptan (IMITREX) 100 MG tablet TAKE 1 TABLET BY MOUTH FOR HEADACHE. MAY REPEAT IN 2 HOURS IF HEADACHE PERSISTS OR RECURS. 27 tablet 2   Vitamin D, Ergocalciferol, (DRISDOL) 1.25 MG (50000  UNIT) CAPS capsule Take 1 capsule (50,000 Units total) by mouth every 7 (seven) days. 12 capsule 0   cyclobenzaprine (FLEXERIL) 10 MG tablet TAKE 1 TABLET (10 MG TOTAL) BY MOUTH AT BEDTIME AS NEEDED FOR MUSCLE SPASMS. 30 tablet 0   doxycycline (VIBRAMYCIN) 100 MG capsule Take 1 capsule (100 mg total) by mouth 2 (two) times daily. 20 capsule 0   gabapentin (NEURONTIN) 100 MG capsule TAKE 1 TO 3 CAPSULES BY MOUTH DAILY AT BEDTIME (Patient not taking: Reported on 05/13/2020) 90 capsule 3   No facility-administered medications prior to visit.    Allergies  Allergen Reactions   Meloxicam     REACTION: unspecified   Sulfonamide Derivatives     REACTION: unspecified    Review of Systems  Constitutional:  Negative for chills and fever.  Respiratory:  Positive for shortness of breath (states happens when the pain hits). Negative for cough.   Cardiovascular:  Negative for chest pain, palpitations and leg swelling.  Musculoskeletal:  Positive for back pain.      Objective:    Physical Exam Vitals and nursing note reviewed.  Constitutional:      Appearance: She is obese.  Cardiovascular:     Rate and Rhythm: Normal rate and regular rhythm.  Pulmonary:     Effort: Pulmonary effort is normal.     Breath sounds: Normal breath sounds.  Chest:     Chest wall: No tenderness.  Abdominal:     General: Bowel sounds are normal.  Musculoskeletal:        General: Tenderness present. No signs of injury.     Thoracic back: Tenderness and bony tenderness present. No signs of trauma or spasms.     Lumbar back: Bony tenderness present.       Back:     Right lower leg: No edema.     Left lower leg: No edema.     Comments: Bilateral upper and lower extremity strength 5/5  Neurological:     Mental Status: She is alert.     Motor: No weakness.     Deep Tendon Reflexes: Reflexes normal.  Psychiatric:        Mood and Affect: Mood normal.        Behavior: Behavior normal.        Thought  Content:  Thought content normal.        Judgment: Judgment normal.    BP 118/74   Pulse 74   Temp (!) 96.7 F (35.9 C)   Resp 14   Ht 5' 5.25" (1.657 m)   Wt 234 lb 4 oz (106.3 kg)   SpO2 98%   BMI 38.68 kg/m  Wt Readings from Last 3 Encounters:  11/04/20 234 lb 4 oz (106.3 kg)  05/13/20 225 lb 1 oz (102.1 kg)  02/05/20 241 lb 3 oz (109.4 kg)    Health Maintenance Due  Topic Date Due   COVID-19 Vaccine (1) Never done   HIV Screening  Never done   Hepatitis C Screening  Never done   PAP SMEAR-Modifier  04/11/2016   INFLUENZA VACCINE  08/11/2020    There are no preventive care reminders to display for this patient.   Lab Results  Component Value Date   TSH 3.47 05/06/2020   Lab Results  Component Value Date   WBC 4.8 05/06/2020   HGB 13.2 05/06/2020   HCT 39.2 05/06/2020   MCV 86.7 05/06/2020   PLT 200.0 05/06/2020   Lab Results  Component Value Date   NA 142 05/06/2020   K 4.1 05/06/2020   CO2 28 05/06/2020   GLUCOSE 109 (H) 05/06/2020   BUN 13 05/06/2020   CREATININE 0.83 05/06/2020   BILITOT 0.5 05/06/2020   ALKPHOS 58 05/06/2020   AST 15 07/24/2020   ALT 18 07/24/2020   PROT 5.1 (L) 05/06/2020   ALBUMIN 3.2 (L) 05/06/2020   CALCIUM 8.4 05/06/2020   ANIONGAP 8 09/25/2018   GFR 77.05 05/06/2020   Lab Results  Component Value Date   CHOL 176 07/24/2020   Lab Results  Component Value Date   HDL 49.90 07/24/2020   Lab Results  Component Value Date   LDLCALC 101 (H) 07/24/2020   Lab Results  Component Value Date   TRIG 128.0 07/24/2020   Lab Results  Component Value Date   CHOLHDL 4 07/24/2020   Lab Results  Component Value Date   HGBA1C 6.2 05/06/2020       Assessment & Plan:   Problem List Items Addressed This Visit       Other   Lumbar pain    Requested a picture of her lumbar spine she does have tenderness there no neuro symptoms currently pending lab results and x-ray results.  Was going to see sports medicine at 1 point during  her appointment.  Started having chest pain and vomiting and was not evaluated for that.      Relevant Orders   DG Lumbar Spine Complete   B12 deficiency   Acute bilateral thoracic back pain - Primary    Patient complaining of bilateral thoracic pain with bony tenderness and muscular tenderness no personal history of autoimmune disorders but does have family history of same.  We will get x-ray of upper and lower back.  Along with some blood work pending lab results      Relevant Orders   DG Thoracic Spine 2 View   CBC   Basic metabolic panel   Sedimentation rate   ANA   Need for immunization against influenza   Relevant Orders   Flu Vaccine QUAD 6+ mos PF IM (Fluarix Quad PF) (Completed)     No orders of the defined types were placed in this encounter.    Romilda Garret, NP

## 2020-11-05 ENCOUNTER — Ambulatory Visit: Payer: 59 | Admitting: Family Medicine

## 2020-11-06 LAB — ANA: Anti Nuclear Antibody (ANA): POSITIVE — AB

## 2020-11-06 LAB — ANTI-NUCLEAR AB-TITER (ANA TITER): ANA Titer 1: 1:40 {titer} — ABNORMAL HIGH

## 2020-11-07 ENCOUNTER — Other Ambulatory Visit: Payer: Self-pay | Admitting: Nurse Practitioner

## 2020-11-07 DIAGNOSIS — R768 Other specified abnormal immunological findings in serum: Secondary | ICD-10-CM

## 2020-11-12 ENCOUNTER — Telehealth: Payer: Self-pay | Admitting: Family Medicine

## 2020-11-12 NOTE — Telephone Encounter (Signed)
Pt called to get lab results °

## 2020-11-13 NOTE — Telephone Encounter (Signed)
Completed in result notes of labs.

## 2020-12-12 ENCOUNTER — Other Ambulatory Visit: Payer: Self-pay

## 2020-12-12 MED FILL — Ergocalciferol Cap 1.25 MG (50000 Unit): ORAL | 28 days supply | Qty: 4 | Fill #1 | Status: AC

## 2020-12-12 MED FILL — Escitalopram Oxalate Tab 20 MG (Base Equiv): ORAL | 30 days supply | Qty: 30 | Fill #3 | Status: AC

## 2020-12-12 MED FILL — Sumatriptan Succinate Tab 100 MG: ORAL | 30 days supply | Qty: 9 | Fill #3 | Status: AC

## 2020-12-16 ENCOUNTER — Emergency Department
Admission: EM | Admit: 2020-12-16 | Discharge: 2020-12-17 | Disposition: A | Discharge status: Home / Self Care | Payer: 59 | Attending: Emergency Medicine | Admitting: Emergency Medicine

## 2020-12-16 ENCOUNTER — Other Ambulatory Visit: Payer: Self-pay

## 2020-12-16 ENCOUNTER — Emergency Department: Payer: 59

## 2020-12-16 ENCOUNTER — Ambulatory Visit
Admission: EM | Admit: 2020-12-16 | Discharge: 2020-12-16 | Payer: 59 | Attending: Family Medicine | Admitting: Family Medicine

## 2020-12-16 DIAGNOSIS — U071 COVID-19: Secondary | ICD-10-CM | POA: Diagnosis not present

## 2020-12-16 DIAGNOSIS — R079 Chest pain, unspecified: Secondary | ICD-10-CM

## 2020-12-16 DIAGNOSIS — R519 Headache, unspecified: Secondary | ICD-10-CM | POA: Diagnosis present

## 2020-12-16 DIAGNOSIS — R509 Fever, unspecified: Secondary | ICD-10-CM

## 2020-12-16 DIAGNOSIS — E039 Hypothyroidism, unspecified: Secondary | ICD-10-CM | POA: Diagnosis not present

## 2020-12-16 DIAGNOSIS — Z87891 Personal history of nicotine dependence: Secondary | ICD-10-CM | POA: Diagnosis not present

## 2020-12-16 LAB — TROPONIN I (HIGH SENSITIVITY)
Troponin I (High Sensitivity): 4 ng/L (ref <18)
Troponin I (High Sensitivity): 4 ng/L (ref <18)

## 2020-12-16 LAB — BASIC METABOLIC PANEL
Anion gap: 6 (ref 5–15)
BUN: 12 mg/dL (ref 6–20)
CO2: 24 mmol/L (ref 22–32)
Calcium: 8.5 mg/dL — ABNORMAL LOW (ref 8.9–10.3)
Chloride: 103 mmol/L (ref 98–111)
Creatinine, Ser: 0.68 mg/dL (ref 0.44–1.00)
GFR, Estimated: >60 mL/min (ref >60)
Glucose, Bld: 138 mg/dL — ABNORMAL HIGH (ref 70–99)
Potassium: 4.2 mmol/L (ref 3.5–5.1)
Sodium: 133 mmol/L — ABNORMAL LOW (ref 135–145)

## 2020-12-16 LAB — CBC
HCT: 46.2 % — ABNORMAL HIGH (ref 36.0–46.0)
Hemoglobin: 15.7 g/dL — ABNORMAL HIGH (ref 12.0–15.0)
MCH: 29.7 pg (ref 26.0–34.0)
MCHC: 34 g/dL (ref 30.0–36.0)
MCV: 87.3 fL (ref 80.0–100.0)
Platelets: 191 10*3/uL (ref 150–400)
RBC: 5.29 MIL/uL — ABNORMAL HIGH (ref 3.87–5.11)
RDW: 12.8 % (ref 11.5–15.5)
WBC: 7.7 10*3/uL (ref 4.0–10.5)
nRBC: 0 % (ref 0.0–0.2)

## 2020-12-16 MED ORDER — ASPIRIN 81 MG PO CHEW
324.0000 mg | CHEWABLE_TABLET | Freq: Once | ORAL | Status: AC
  Administered 2020-12-16: 324 mg via ORAL

## 2020-12-16 NOTE — ED Triage Notes (Signed)
Pt presents with 10/10 chest pain, HA and fever. Chest pain started minutes ago. Her husband has Covid and 2 at home tests were negative.

## 2020-12-16 NOTE — ED Provider Notes (Addendum)
[name]    CSN: [id number] Arrival date & time: 12/16/20  1800      History   Chief Complaint Chief Complaint  Patient presents with   Chest Pain    HPI Evelyn Shepherd is a 60 y.o. female.   HPI Patient presents with chest pain x one hour. Patient has a past medical history of recurrent chest pain, work -up in 2021 by cardiology  CT  coronary consistent with CAD. She was also concerned for possible COVID as she had a fever 101 one hour ago and her husband tested positive for COVID today.  She reports mild SOB, oxygen level 92% on arrival. Placed on 2 liters improved 95-96%.  Patient report pain mid sternal 10/10, following oxygen and ASA improved to 4/10. Care transferred to EMS   Past Medical History:  Diagnosis Date   History of depression    History of gastroesophageal reflux (GERD)    History of hyperlipidemia    History of migraine    History of obesity    lap band surgery   History of pneumonia 2005    Patient Active Problem List   Diagnosis Date Noted   Need for immunization against influenza 11/04/2020   Vitamin D deficiency 05/06/2020   Acute bilateral thoracic back pain 11/06/2019   Meralgia paraesthetica, left 11/06/2019   Hip pain 05/01/2019   Muscle cramping 05/01/2019   Bariatric surgery status 05/01/2019   Urinary frequency 11/23/2018   Screening mammogram, encounter for 11/23/2018   B12 deficiency 11/23/2018   Vertigo 07/17/2018   Former smoker 06/08/2017   History of shingles 01/22/2016   Bursitis of left hip 01/29/2015   Lumbar pain 12/26/2013   Brachioradial pruritus 11/07/2013   Fatigue 11/07/2013   Encounter for routine gynecological examination 04/11/2013   Chest pain 04/11/2013   Plantar fasciitis 04/11/2013   Colon cancer screening 02/13/2013   Screening for breast cancer 04/13/2011   Special screening for malignant neoplasms, colon 04/13/2011   Seborrheic dermatitis 04/13/2011   Hypothyroidism 08/11/2007   LOW BACK  PAIN, CHRONIC 07/14/2006   Prediabetes 07/06/2006   Hyperlipidemia 07/06/2006   Depression, recurrent ([place]) 07/06/2006   Migraine without aura 07/06/2006   GERD 07/06/2006    Past Surgical History:  Procedure Laterality Date   AUGMENTATION MAMMAPLASTY Bilateral 1991   BREAST ENHANCEMENT SURGERY     CESAREAN [place] GASTRIC BANDING  07/31/07   LUMBAR [place] SURGERY     TONGUE SURGERY  03/2005   lesion removal    OB History   No obstetric history on file.      Home Medications    Prior to Admission medications   Medication Sig Start Date End Date Taking? Authorizing Provider  albuterol (VENTOLIN HFA) 108 (90 Base) MCG/ACT inhaler Inhale 1-2 puffs into the lungs every 6 (six) hours as needed (cough). 09/14/20   Boddu, Erasmo Downer, FNP  atorvastatin (LIPITOR) 20 MG tablet Take 1 tablet (20 mg total) by mouth daily. 05/13/20   Tower, Wynelle Fanny, MD  escitalopram (LEXAPRO) 20 MG tablet TAKE 1 TABLET BY MOUTH DAILY. 01/16/20   Tower, Wynelle Fanny, MD  fluticasone (FLONASE) 50 MCG/ACT nasal spray Place 2 sprays into both nostrils daily as needed for allergies or rhinitis.    [provider]  ketoconazole (NIZORAL) 2 % cream APPLY TO AREAS OF FACE THAT ARE AFFECTED ONCE DAILY 01/26/18   Tower, Wynelle Fanny, MD  Magnesium Citrate 125 MG CAPS  Take by mouth in the morning and at bedtime.    [provider]  SUMAtriptan (IMITREX) 100 MG tablet TAKE 1 TABLET BY MOUTH FOR HEADACHE. MAY REPEAT IN 2 HOURS IF HEADACHE PERSISTS OR RECURS. 01/16/20   Tower, Wynelle Fanny, MD  Vitamin D, Ergocalciferol, (DRISDOL) 1.25 MG (50000 UNIT) CAPS capsule Take 1 capsule (50,000 Units total) by mouth every 7 (seven) days. 10/06/20   Tower, Wynelle Fanny, MD    Family History Family History  Problem Relation Age of Onset   Kidney cancer Mother    Diabetes Mother    Heart attack Mother    Pneumonia Father        died   Lung cancer Father    Depression Brother        commited suicide    Alcohol abuse Brother    Breast cancer Paternal Aunt    Breast cancer Paternal Aunt    Early menopause Other        family   Colon cancer Neg Hx    Pancreatic cancer Neg Hx    Rectal cancer Neg Hx    Stomach cancer Neg Hx     Social History Social History   Tobacco Use   Smoking status: Former    Packs/day: 1.50    Years: 12.00    Pack years: 18.00    Types: Cigarettes    Quit date: 09/25/2017    Years since quitting: 3.2   Smokeless tobacco: Never   Tobacco comments:    Smokes 1 cigarette daily   Vaping Use   Vaping Use: Never used  Substance Use Topics   Alcohol use: Yes    Alcohol/week: 0.0 standard drinks    Comment: occasional on monthly basis   Drug use: No     Allergies   Meloxicam and Sulfonamide derivatives   Review of Systems Review of Systems Pertinent negatives listed in HPI  Physical Exam Triage Vital Signs ED Triage Vitals  Enc Vitals Group     BP      Pulse      Resp      Temp      Temp src      SpO2      Weight      Height      Head Circumference      Peak Flow      Pain Score      Pain Loc      Pain Edu?      Excl. in [place]?    No data found.  Updated Vital Signs BP 125/75 (BP Location: Right Leg)   Pulse 96   Temp 99.2 F (37.3 C) (Oral)   Resp 20   SpO2 95%   Visual Acuity Right Eye Distance:   Left Eye Distance:   Bilateral Distance:    Right Eye Near:   Left Eye Near:    Bilateral Near:     Physical Exam Constitutional:      Appearance: She is obese.  HENT:     Head: Normocephalic and atraumatic.  Eyes:     Pupils: Pupils are equal, round, and reactive to light.  Cardiovascular:     Rate and Rhythm: Regular rhythm. Tachycardia present.  Pulmonary:     Effort: Tachypnea present. No accessory muscle usage.     Breath sounds: No stridor.  Musculoskeletal:        General: Normal range of motion.     Cervical back: Normal range of motion and neck  supple.     Right lower leg: No edema.     Left lower leg: No  edema.  Skin:    Capillary Refill: Capillary refill takes less than 2 seconds.  Neurological:     General: No focal deficit present.  Psychiatric:        Mood and Affect: Mood normal.     UC Treatments / Results  Labs (all labs ordered are listed, but only abnormal results are displayed) Labs Reviewed - No data to display  EKG NSR 100 BPM, w/non specific ST changes (original given to EMS, EKG did not electronically transfer to EMR)  Radiology No results found.  Procedures Procedures (including critical care time)  Medications Ordered in UC Medications  aspirin chewable tablet 324 mg (324 mg Oral Given 12/16/20 1800)    Initial Impression / Assessment and Plan / UC Course  I have reviewed the triage vital signs and the nursing notes.  Pertinent labs & imaging results that were available during my care of the patient were reviewed by me and considered in my medical decision making (see chart for details).    Patient transported via EMS to [facility] for further work-up and evaluation of chest pain. Final Clinical Impressions(s) / UC Diagnoses   Final diagnoses:  Chest pain, unspecified type  Fever, unspecified fever cause   Discharge Instructions   None    ED Prescriptions   None    PDMP not reviewed this encounter.   Scot Jun, FNP 12/16/20 1827    Scot Jun, FNP 12/16/20 [phone]

## 2020-12-16 NOTE — ED Triage Notes (Signed)
Pt comes via EMs with c/o CP. Pt states this started earlier today with nausea and vomiting. Pt states 10/10 at first. Pt states husband tested for covid today and was positive.  Pt was given 324 aspirin at MD office.  Pt has hx of angina. HR-98 96 % RA 124/76 CBG-153 20 lac

## 2020-12-17 ENCOUNTER — Other Ambulatory Visit: Payer: Self-pay

## 2020-12-17 LAB — HEPATIC FUNCTION PANEL
ALT: 20 U/L (ref 0–44)
AST: 20 U/L (ref 15–41)
Albumin: 3.7 g/dL (ref 3.5–5.0)
Alkaline Phosphatase: 50 U/L (ref 38–126)
Bilirubin, Direct: 0.2 mg/dL (ref 0.0–0.2)
Indirect Bilirubin: 0.6 mg/dL (ref 0.3–0.9)
Total Bilirubin: 0.8 mg/dL (ref 0.3–1.2)
Total Protein: 6 g/dL — ABNORMAL LOW (ref 6.5–8.1)

## 2020-12-17 LAB — RESP PANEL BY RT-PCR (FLU A&B, COVID) ARPGX2
Influenza A by PCR: NEGATIVE
Influenza B by PCR: NEGATIVE
SARS Coronavirus 2 by RT PCR: POSITIVE — AB

## 2020-12-17 LAB — LIPASE, BLOOD: Lipase: 27 U/L (ref 11–51)

## 2020-12-17 MED ORDER — IBUPROFEN 800 MG PO TABS
800.0000 mg | ORAL_TABLET | Freq: Once | ORAL | Status: AC
  Administered 2020-12-17: 800 mg via ORAL
  Filled 2020-12-17: qty 1

## 2020-12-17 MED ORDER — NIRMATRELVIR/RITONAVIR (PAXLOVID)TABLET
3.0000 | ORAL_TABLET | Freq: Two times a day (BID) | ORAL | 0 refills | Status: AC
  Filled 2020-12-17: qty 30, 5d supply, fill #0

## 2020-12-17 NOTE — ED Notes (Signed)
Pt ambulated within her room and had an O2 sat of 93-94% on RA. MD notified.

## 2020-12-17 NOTE — ED Notes (Signed)
E-signature pad unavailable - Pt verbalized understanding of D/C information - no additional concerns at this time.  

## 2020-12-17 NOTE — Discharge Instructions (Addendum)
You may alternate Tylenol 1000 mg every 6 hours as needed for pain, fever and Ibuprofen 800 mg every 8 hours as needed for pain, fever.  Please take Ibuprofen with food.  Do not take more than 4000 mg of Tylenol (acetaminophen) in a 24 hour period.   Please hold your Lipitor for one week while on Paxlovid.

## 2020-12-17 NOTE — ED Provider Notes (Signed)
[facility] Emergency Department Provider Note  ____________________________________________   Event Date/Time   First MD Initiated Contact with Patient 12/16/20 2354     (approximate)  I have reviewed the triage vital signs and the nursing notes.   HISTORY  Chief Complaint Chest Pain    HPI Evelyn Shepherd is a 60 y.o. female with h/o GERD, migraines, HLD, depression who presents to the ED with c/o frontal HA that started this morning, chills, dry cough, nausea and vomiting and then central chest pressure that started at 5pm associated with burning in bilateral jaw and SOB.  SOB now gone.  Still having some HA and chest pain.  Also having cramping in RUQ that she reports is something chronic for her.  Husband just positive for COVID 19 today.  No A/A factors.      Past Medical History:  Diagnosis Date   History of depression    History of gastroesophageal reflux (GERD)    History of hyperlipidemia    History of migraine    History of obesity    lap band surgery   History of pneumonia 2005    Patient Active Problem List   Diagnosis Date Noted   Need for immunization against influenza 11/04/2020   Vitamin D deficiency 05/06/2020   Acute bilateral thoracic back pain 11/06/2019   Meralgia paraesthetica, left 11/06/2019   Hip pain 05/01/2019   Muscle cramping 05/01/2019   Bariatric surgery status 05/01/2019   Urinary frequency 11/23/2018   Screening mammogram, encounter for 11/23/2018   B12 deficiency 11/23/2018   Vertigo 07/17/2018   Former smoker 06/08/2017   History of shingles 01/22/2016   Bursitis of left hip 01/29/2015   Lumbar pain 12/26/2013   Brachioradial pruritus 11/07/2013   Fatigue 11/07/2013   Encounter for routine gynecological examination 04/11/2013   Chest pain 04/11/2013   Plantar fasciitis 04/11/2013   Colon cancer screening 02/13/2013   Screening for breast cancer 04/13/2011   Special screening for malignant neoplasms,  colon 04/13/2011   Seborrheic dermatitis 04/13/2011   Hypothyroidism 08/11/2007   LOW BACK PAIN, CHRONIC 07/14/2006   Prediabetes 07/06/2006   Hyperlipidemia 07/06/2006   Depression, recurrent ([place]) 07/06/2006   Migraine without aura 07/06/2006   GERD 07/06/2006    Past Surgical History:  Procedure Laterality Date   AUGMENTATION MAMMAPLASTY Bilateral 1991   [place] GASTRIC BANDING  07/31/07   LUMBAR [place] SURGERY     TONGUE SURGERY  03/2005   lesion removal    Prior to Admission medications   Medication Sig Start Date End Date Taking? Authorizing Provider  nirmatrelvir/ritonavir EUA (PAXLOVID) 20 x 150 MG & 10 x 100MG  TABS Take 3 tablets by mouth 2 (two) times daily for 5 days. Patient GFR is 60. Take nirmatrelvir (150 mg) two tablets twice daily for 5 days and ritonavir (100 mg) one tablet twice daily for 5 days. 12/17/20 12/22/20 Yes Deeann Servidio, Cyril Mourning N, DO  albuterol (VENTOLIN HFA) 108 (90 Base) MCG/ACT inhaler Inhale 1-2 puffs into the lungs every 6 (six) hours as needed (cough). 09/14/20   Boddu, Erasmo Downer, FNP  atorvastatin (LIPITOR) 20 MG tablet Take 1 tablet (20 mg total) by mouth daily. 05/13/20   Tower, Wynelle Fanny, MD  escitalopram (LEXAPRO) 20 MG tablet TAKE 1 TABLET BY MOUTH DAILY. 01/16/20   Tower, Wynelle Fanny, MD  fluticasone (FLONASE) 50 MCG/ACT nasal spray Place 2 sprays  into both nostrils daily as needed for allergies or rhinitis.    [provider]  ketoconazole (NIZORAL) 2 % cream APPLY TO AREAS OF FACE THAT ARE AFFECTED ONCE DAILY 01/26/18   Tower, Wynelle Fanny, MD  Magnesium Citrate 125 MG CAPS Take by mouth in the morning and at bedtime.    [provider]  SUMAtriptan (IMITREX) 100 MG tablet TAKE 1 TABLET BY MOUTH FOR HEADACHE. MAY REPEAT IN 2 HOURS IF HEADACHE PERSISTS OR RECURS. 01/16/20   Tower, Wynelle Fanny, MD  Vitamin D, Ergocalciferol, (DRISDOL) 1.25 MG (50000 UNIT) CAPS capsule Take 1 capsule  (50,000 Units total) by mouth every 7 (seven) days. 10/06/20   Tower, Wynelle Fanny, MD    Allergies Meloxicam and Sulfonamide derivatives  Family History  Problem Relation Age of Onset   Kidney cancer Mother    Diabetes Mother    Heart attack Mother    Pneumonia Father        died   Lung cancer Father    Depression Brother        commited suicide   Alcohol abuse Brother    Breast cancer Paternal Aunt    Breast cancer Paternal Aunt    Early menopause Other        family   Colon cancer Neg Hx    Pancreatic cancer Neg Hx    Rectal cancer Neg Hx    Stomach cancer Neg Hx     Social History Social History   Tobacco Use   Smoking status: Former    Packs/day: 1.50    Years: 12.00    Pack years: 18.00    Types: Cigarettes    Quit date: 09/25/2017    Years since quitting: 3.2   Smokeless tobacco: Never   Tobacco comments:    Smokes 1 cigarette daily   Vaping Use   Vaping Use: Never used  Substance Use Topics   Alcohol use: Yes    Alcohol/week: 0.0 standard drinks    Comment: occasional on monthly basis   Drug use: No    Review of Systems Constitutional: No fever.  + chills. Eyes: No visual changes. ENT: No sore throat. Cardiovascular: + chest pain. Respiratory: + shortness of breath. Gastrointestinal: + nausea, vomiting.  No diarrhea. Genitourinary: Negative for dysuria. Musculoskeletal: Negative for back pain. Skin: Negative for rash. Neurological: Negative for focal weakness or numbness.  ____________________________________________   PHYSICAL EXAM:  VITAL SIGNS: ED Triage Vitals  Enc Vitals Group     BP 12/16/20 1902 103/73     Pulse Rate 12/16/20 1902 65     Resp 12/16/20 1902 18     Temp 12/16/20 1902 98.5 F (36.9 C)     Temp Source 12/16/20 1902 Oral     SpO2 12/16/20 1902 96 %     Weight --      Height --      Head Circumference --      Peak Flow --      Pain Score 12/16/20 1841 3     Pain Loc --      Pain Edu? --      Excl. in [place]? --     CONSTITUTIONAL: Alert and oriented and responds appropriately to questions. Well-appearing; well-nourished HEAD: Normocephalic EYES: Conjunctivae clear, pupils appear equal, EOM appear intact ENT: normal nose; moist mucous membranes NECK: Supple, normal ROM CARD: RRR; S1 and S2 appreciated; no murmurs, no clicks, no rubs, no gallops RESP: Normal chest excursion without splinting or tachypnea; breath  sounds clear and equal bilaterally; no wheezes, no rhonchi, no rales, no hypoxia or respiratory distress, speaking full sentences ABD/GI: Normal bowel sounds; non-distended; soft, non-tender, no rebound, no guarding, no peritoneal signs, no hepatosplenomegaly BACK: The back appears normal EXT: Normal ROM in all joints; no deformity noted, no edema; no cyanosis, no calf tenderness or swelling SKIN: Normal color for age and race; warm; no rash on exposed skin NEURO: Moves all extremities equally, no facial asymmetry, normal speech PSYCH: The patient's mood and manner are appropriate.  ____________________________________________   LABS (all labs ordered are listed, but only abnormal results are displayed)  Labs Reviewed  RESP PANEL BY RT-PCR (FLU A&B, COVID) ARPGX2 - Abnormal; Notable for the following components:      Result Value   SARS Coronavirus 2 by RT PCR POSITIVE (*)    All other components within normal limits  BASIC METABOLIC PANEL - Abnormal; Notable for the following components:   Sodium 133 (*)    Glucose, Bld 138 (*)    Calcium 8.5 (*)    All other components within normal limits  CBC - Abnormal; Notable for the following components:   RBC 5.29 (*)    Hemoglobin 15.7 (*)    HCT 46.2 (*)    All other components within normal limits  HEPATIC FUNCTION PANEL - Abnormal; Notable for the following components:   Total Protein 6.0 (*)    All other components within normal limits  LIPASE, BLOOD  POC URINE PREG, ED  TROPONIN I (HIGH SENSITIVITY)  TROPONIN I (HIGH SENSITIVITY)    ____________________________________________  EKG   EKG Interpretation  Date/Time:  Tuesday December 16 2020 19:04:12 EST Ventricular Rate:  94 PR Interval:  154 QRS Duration: 88 QT Interval:  346 QTC Calculation: 432 R Axis:   -24 Text Interpretation: Normal sinus rhythm Nonspecific T wave abnormality Abnormal ECG No significant change since last tracing Confirmed by Pryor Curia [phone]) on 12/17/2020 12:37:36 AM        ____________________________________________  RADIOLOGY Jessie Foot Dorothey Oetken, personally viewed and evaluated these images (plain radiographs) as part of my medical decision making, as well as reviewing the written report by the radiologist.  ED MD interpretation: Chest x-ray clear  Official radiology report(s): DG Chest 2 View  Result Date: 12/16/2020 CLINICAL DATA:  Chest pain for 1 day EXAM: CHEST - 2 VIEW COMPARISON:  09/14/2020 FINDINGS: The heart size and mediastinal contours are within normal limits. Both lungs are clear. The visualized skeletal structures are unremarkable. Gastric lap band is noted in the upper abdomen. IMPRESSION: No active cardiopulmonary disease. Electronically Signed   By: Inez Catalina M.D.   On: 12/16/2020 19:05    ____________________________________________   PROCEDURES  Procedure(s) performed (including Critical Care):  Procedures   ____________________________________________   INITIAL IMPRESSION / ASSESSMENT AND PLAN / ED COURSE  As part of my medical decision making, I reviewed the following data within the [place] notes reviewed and incorporated, Labs reviewed , EKG interpreted , Old EKG reviewed, Old chart reviewed, Radiograph reviewed , and Notes from prior ED visits         Patient here with flulike symptoms.  Husband just tested positive for COVID-19.  I suspect that she likely has COVID as well.  She is complaining of chest pain but this seems atypical.  She has had 2 negative  troponins in triage.  EKG shows nonspecific T wave abnormalities but no change compared to previous.  Doubt ACS.  Doubt  PE, dissection.  Chest x-ray shows no infiltrate or edema.  COVID and flu swab pending.  Will give ibuprofen to see if this helps with her headache and right upper quadrant abdominal cramping.  Her abdominal exam is benign.  We will add on LFTs and lipase.  Doubt cholelithiasis, cholecystitis, pancreatitis based on her benign exam.  ED PROGRESS  Patient reports feeling better after ibuprofen.  LFTs, lipase unremarkable.  She is positive for COVID-19.  She did have 1 low blood pressure documented while in the waiting room but this has been rechecked multiple times and is normal.  She has been able to ambulate and has not been hypoxic with exertion or at rest on room air.  She denies feeling short of breath currently.  I feel she is safe to be discharged home and she is also comfortable with this plan.  Discussed at length return precautions.  Will discharge with prescription of paxlovid.  Have advised her to hold her Lipitor while on this medication.   At this time, I do not feel there is any life-threatening condition present. I have reviewed, interpreted and discussed all results (EKG, imaging, lab, urine as appropriate) and exam findings with patient/family. I have reviewed nursing notes and appropriate previous records.  I feel the patient is safe to be discharged home without further emergent workup and can continue workup as an outpatient as needed. Discussed usual and customary return precautions. Patient/family verbalize understanding and are comfortable with this plan.  Outpatient follow-up has been provided as needed. All questions have been answered.  ____________________________________________   FINAL CLINICAL IMPRESSION(S) / ED DIAGNOSES  Final diagnoses:  [id number]     ED Discharge Orders          Ordered    nirmatrelvir/ritonavir EUA (PAXLOVID) 20 x 150 MG & 10 x  100MG  TABS  2 times daily        12/17/20 0123            *Please note:  Evelyn Shepherd was evaluated in Emergency Department on 12/17/2020 for the symptoms described in the history of present illness. She was evaluated in the context of the global COVID-19 pandemic, which necessitated consideration that the patient might be at risk for infection with the SARS-CoV-2 virus that causes COVID-19. Institutional protocols and algorithms that pertain to the evaluation of patients at risk for COVID-19 are in a state of rapid change based on information released by regulatory bodies including the CDC and federal and state organizations. These policies and algorithms were followed during the patient's care in the ED.  Some ED evaluations and interventions may be delayed as a result of limited staffing during and the pandemic.*   Note:  This document was prepared using Dragon voice recognition software and may include unintentional dictation errors.    Ebin Palazzi, Delice Bison, DO 12/17/20 [phone]

## 2020-12-26 ENCOUNTER — Encounter: Payer: Self-pay | Admitting: Cardiology

## 2020-12-26 ENCOUNTER — Other Ambulatory Visit: Payer: Self-pay

## 2020-12-26 ENCOUNTER — Ambulatory Visit: Payer: 59 | Admitting: Cardiology

## 2020-12-26 VITALS — BP 108/60 | HR 87 | Ht 65.5 in | Wt 233.0 lb

## 2020-12-26 DIAGNOSIS — E7800 Pure hypercholesterolemia, unspecified: Secondary | ICD-10-CM

## 2020-12-26 DIAGNOSIS — R079 Chest pain, unspecified: Secondary | ICD-10-CM

## 2020-12-26 NOTE — Patient Instructions (Addendum)
Medication Instructions:   Your physician recommends that you continue on your current medications as directed. Please refer to the Current Medication list given to you today.  *If you need a refill on your cardiac medications before your next appointment, please call your pharmacy*   Lab Work:  None Ordered  If you have labs (blood work) drawn today and your tests are completely normal, you will receive your results only by: MyChart Message (if you have MyChart) OR A paper copy in the mail If you have any lab test that is abnormal or we need to change your treatment, we will call you to review the results.   Testing/Procedures:  None Ordered   Follow-Up: At [facility], you and your health needs are our priority.  As part of our continuing mission to provide you with exceptional heart care, we have created designated Provider Care Teams.  These Care Teams include your primary Cardiologist (physician) and Advanced Practice Providers (APPs -  Physician Assistants and Nurse Practitioners) who all work together to provide you with the care you need, when you need it.  We recommend signing up for the patient portal called "MyChart".  Sign up information is provided on this After Visit Summary.  MyChart is used to connect with patients for Virtual Visits (Telemedicine).  Patients are able to view lab/test results, encounter notes, upcoming appointments, etc.  Non-urgent messages can be sent to your provider as well.   To learn more about what you can do with MyChart, go to [email or web address].    Your next appointment:  As needed  

## 2020-12-26 NOTE — Progress Notes (Signed)
Cardiology Office Note:    Date:  12/26/2020   ID:  Evelyn Shepherd, DOB [birth year], MRN [id number]  PCP:  Evelyn Greenspan, MD  [facility] Cardiologist:  None  [facility] Electrophysiologist:  None   Referring MD: Evelyn Greenspan, MD   Chief Complaint  Patient presents with   Other    Urgent care follow up for chest pain -- Meds reviewed verbally with patient.     History of Present Illness:    Evelyn Shepherd is a 60 y.o. female with a hx of hyperlipidemia, migraines, current smoker x50+ years who presents due to chest pain.    Patient has symptoms of nausea, vomiting, chest discomfort about 2 weeks ago.  Presented Urgent care, EKG was noted to be abnormal.  She was advised to go to the emergency room.  In the ED, work-up with troponins was unrevealing, she was subsequently discharged and advised to follow-up with cardiology.  Her chest discomfort is reproducible with palpation.  She also has pain along her shoulder blades bilaterally.  Saw primary care provider, blood work obtained with possible rheumatological abnormalities.  Scheduled to see rheumatology.  Denies heartburn.  Patient previously seen due to symptoms of chest pain.  Work-up with echo and coronary CTA in 12/2019 were normal.  Prior notes Echo 12/2019 EF 60 to 65%. Coronary CTA 12/2019 calcium score 0, no evidence for CAD.    Past Medical History:  Diagnosis Date   History of depression    History of gastroesophageal reflux (GERD)    History of hyperlipidemia    History of migraine    History of obesity    lap band surgery   History of pneumonia 2005    Past Surgical History:  Procedure Laterality Date   AUGMENTATION MAMMAPLASTY Bilateral 1991   BREAST ENHANCEMENT SURGERY     CESAREAN SECTION     DENTAL SURGERY     LAPAROSCOPIC GASTRIC BANDING  07/31/07   LUMBAR [place] SURGERY     TONGUE SURGERY  03/2005   lesion removal    Current Medications: Current Meds  Medication Sig   albuterol  (VENTOLIN HFA) 108 (90 Base) MCG/ACT inhaler Inhale 1-2 puffs into the lungs every 6 (six) hours as needed (cough).   atorvastatin (LIPITOR) 20 MG tablet Take 1 tablet (20 mg total) by mouth daily.   escitalopram (LEXAPRO) 20 MG tablet TAKE 1 TABLET BY MOUTH DAILY.   fluticasone (FLONASE) 50 MCG/ACT nasal spray Place 2 sprays into both nostrils daily as needed for allergies or rhinitis.   ketoconazole (NIZORAL) 2 % cream APPLY TO AREAS OF FACE THAT ARE AFFECTED ONCE DAILY   Magnesium Citrate 125 MG CAPS Take by mouth in the morning and at bedtime.   SUMAtriptan (IMITREX) 100 MG tablet TAKE 1 TABLET BY MOUTH FOR HEADACHE. MAY REPEAT IN 2 HOURS IF HEADACHE PERSISTS OR RECURS.   Vitamin D, Ergocalciferol, (DRISDOL) 1.25 MG (50000 UNIT) CAPS capsule Take 1 capsule (50,000 Units total) by mouth every 7 (seven) days.     Allergies:   Meloxicam and Sulfonamide derivatives   Social History   Socioeconomic History   Marital status: Married    Spouse name: Not on file   Number of children: Not on file   Years of education: Not on file   Highest education level: Not on file  Occupational History   Not on file  Tobacco Use   Smoking status: Former    Packs/day: 1.50    Years: 12.00  Pack years: 18.00    Types: Cigarettes    Quit date: 09/25/2017    Years since quitting: 3.2   Smokeless tobacco: Never   Tobacco comments:    Smokes 1 cigarette daily   Vaping Use   Vaping Use: Never used  Substance and Sexual Activity   Alcohol use: Yes    Alcohol/week: 0.0 standard drinks    Comment: occasional on monthly basis   Drug use: No   Sexual activity: Yes    Partners: Female    Birth control/protection: Post-menopausal  Other Topics Concern   Not on file  Social History Narrative   Not on file   Social Determinants of Health   Financial Resource Strain: Not on file  Food Insecurity: Not on file  Transportation Needs: Not on file  Physical Activity: Not on file  Stress: Not on file   Social Connections: Not on file     Family History: The patient's family history includes Alcohol abuse in her brother; Breast cancer in her paternal aunt and paternal aunt; Depression in her brother; Diabetes in her mother; Early menopause in an other family member; Heart attack in her mother; Kidney cancer in her mother; Lung cancer in her father; Pneumonia in her father. There is no history of Colon cancer, Pancreatic cancer, Rectal cancer, or Stomach cancer.  ROS:   Please see the history of present illness.     All other systems reviewed and are negative.  EKGs/Labs/Other Studies Reviewed:    The following studies were reviewed today:   EKG:  EKG is ordered today.  EKG shows sinus rhythm, nonspecific ST-T changes  Recent Labs: 05/06/2020: TSH 3.47 12/16/2020: ALT 20; BUN 12; Creatinine, Ser 0.68; Hemoglobin 15.7; Platelets 191; Potassium 4.2; Sodium 133  Recent Lipid Panel    Component Value Date/Time   CHOL 176 07/24/2020 0736   TRIG 128.0 07/24/2020 0736   HDL 49.90 07/24/2020 0736   CHOLHDL 4 07/24/2020 0736   VLDL 25.6 07/24/2020 0736   LDLCALC 101 (H) 07/24/2020 0736   LDLDIRECT 187.8 10/22/2011 0849     Risk Assessment/Calculations:      Physical Exam:    VS:  BP 108/60 (BP Location: Left Arm, Patient Position: Sitting, Cuff Size: Normal)    Pulse 87    Ht 5' 5.5" (1.664 m)    Wt 233 lb (105.7 kg)    SpO2 97%    BMI 38.18 kg/m     Wt Readings from Last 3 Encounters:  12/26/20 233 lb (105.7 kg)  11/04/20 234 lb 4 oz (106.3 kg)  05/13/20 225 lb 1 oz (102.1 kg)     GEN:  Well nourished, well developed in no acute distress HEENT: Normal NECK: No JVD; No carotid bruits CARDIAC: RRR, no murmurs, rubs, gallops RESPIRATORY:  Clear to auscultation without rales, wheezing or rhonchi  ABDOMEN: Soft, non-tender, non-distended MUSCULOSKELETAL:  No edema; chest is tender to palpation. SKIN: Warm and dry NEUROLOGIC:  Alert and oriented x 3 PSYCHIATRIC:  Normal  affect   ASSESSMENT:    1. Chest pain of uncertain etiology   2. Pure hypercholesterolemia     PLAN:    In order of problems listed above:  Patient with chest pain.  Reproducible with palpation indicating musculoskeletal abnormality/origin.  Prior echocardiogram showed normal systolic function, EF 60 to 65%.  No gross structural abnormalities.  Coronary CTA showed a calcium score of 0, no evidence of CAD.  Advised to follow-up with PCP regarding management of musculoskeletal pain.  Trial of NSAIDs can be considered. History of hyperlipidemia, continue Lipitor.   Follow-up as needed   Shared Decision Making/Informed Consent      Medication Adjustments/Labs and Tests Ordered: Current medicines are reviewed at length with the patient today.  Concerns regarding medicines are outlined above.  No orders of the defined types were placed in this encounter.  No orders of the defined types were placed in this encounter.   Patient Instructions  Medication Instructions:   Your physician recommends that you continue on your current medications as directed. Please refer to the Current Medication list given to you today.  *If you need a refill on your cardiac medications before your next appointment, please call your pharmacy*   Lab Work:  None Ordered  If you have labs (blood work) drawn today and your tests are completely normal, you will receive your results only by: [place] (if you have MyChart) OR A paper copy in the mail If you have any lab test that is abnormal or we need to change your treatment, we will call you to review the results.   Testing/Procedures:  None Ordered  Follow-Up: At [facility], you and your health needs are our priority.  As part of our continuing mission to provide you with exceptional heart care, we have created designated Provider Care Teams.  These Care Teams include your primary Cardiologist (physician) and Advanced Practice Providers  (APPs -  Physician Assistants and Nurse Practitioners) who all work together to provide you with the care you need, when you need it.  We recommend signing up for the patient portal called "MyChart".  Sign up information is provided on this After Visit Summary.  MyChart is used to connect with patients for Virtual Visits (Telemedicine).  Patients are able to view lab/test results, encounter notes, upcoming appointments, etc.  Non-urgent messages can be sent to your provider as well.   To learn more about what you can do with MyChart, go to [email or web address].    Your next appointment:   As needed    Signed, Kate Sable, MD  12/26/2020 12:51 PM    [place]

## 2021-01-23 ENCOUNTER — Other Ambulatory Visit: Payer: Self-pay

## 2021-01-23 MED ORDER — GABAPENTIN 100 MG PO CAPS
100.0000 mg | ORAL_CAPSULE | Freq: Two times a day (BID) | ORAL | 5 refills | Status: DC
  Filled 2021-01-23: qty 60, 30d supply, fill #0
  Filled 2021-06-15: qty 60, 30d supply, fill #1
  Filled 2021-07-15: qty 60, 30d supply, fill #2

## 2021-01-30 ENCOUNTER — Ambulatory Visit (INDEPENDENT_AMBULATORY_CARE_PROVIDER_SITE_OTHER): Payer: 59 | Admitting: Family Medicine

## 2021-01-30 ENCOUNTER — Other Ambulatory Visit: Payer: Self-pay

## 2021-01-30 ENCOUNTER — Encounter: Payer: Self-pay | Admitting: Family Medicine

## 2021-01-30 ENCOUNTER — Telehealth: Payer: Self-pay | Admitting: *Deleted

## 2021-01-30 VITALS — BP 90/60 | HR 75 | Temp 97.5°F | Ht 65.5 in | Wt 229.4 lb

## 2021-01-30 DIAGNOSIS — E538 Deficiency of other specified B group vitamins: Secondary | ICD-10-CM | POA: Diagnosis not present

## 2021-01-30 DIAGNOSIS — N3946 Mixed incontinence: Secondary | ICD-10-CM

## 2021-01-30 DIAGNOSIS — R0789 Other chest pain: Secondary | ICD-10-CM | POA: Diagnosis not present

## 2021-01-30 DIAGNOSIS — R32 Unspecified urinary incontinence: Secondary | ICD-10-CM | POA: Insufficient documentation

## 2021-01-30 DIAGNOSIS — R519 Headache, unspecified: Secondary | ICD-10-CM | POA: Insufficient documentation

## 2021-01-30 DIAGNOSIS — M797 Fibromyalgia: Secondary | ICD-10-CM | POA: Diagnosis not present

## 2021-01-30 LAB — POC URINALSYSI DIPSTICK (AUTOMATED)
Bilirubin, UA: NEGATIVE
Glucose, UA: NEGATIVE
Ketones, UA: NEGATIVE
Leukocytes, UA: NEGATIVE
Nitrite, UA: NEGATIVE
Protein, UA: NEGATIVE
Spec Grav, UA: 1.025 (ref 1.010–1.025)
Urobilinogen, UA: 0.2 E.U./dL
pH, UA: 6 (ref 5.0–8.0)

## 2021-01-30 MED ORDER — MIRABEGRON ER 25 MG PO TB24
25.0000 mg | ORAL_TABLET | Freq: Every day | ORAL | 11 refills | Status: DC
  Filled 2021-01-30: qty 30, 30d supply, fill #0

## 2021-01-30 MED ORDER — IBUPROFEN 800 MG PO TABS
800.0000 mg | ORAL_TABLET | Freq: Three times a day (TID) | ORAL | 0 refills | Status: DC | PRN
  Filled 2021-01-30: qty 30, 10d supply, fill #0

## 2021-01-30 MED ORDER — CYANOCOBALAMIN 1000 MCG/ML IJ SOLN
1000.0000 ug | Freq: Once | INTRAMUSCULAR | Status: AC
  Administered 2021-01-30: 1000 ug via INTRAMUSCULAR

## 2021-01-30 NOTE — Assessment & Plan Note (Addendum)
Normal B12  in 4.2022 on  Monthly B12.. given today.

## 2021-01-30 NOTE — Patient Instructions (Addendum)
Start flonase 2 sprays per nostril for sinus congestion and headache likely remaining from recent viral infection.  Continue on gabapentin given it more time and with MD  consider  titrating up if pain not controlled. Will add antiinflammatory: ibuprofen 800 mg TID prn to help with inflammation and OA component.  Stop if stomach upset.  Start gentle stretching of back and chest wall.  Can try trial of myrbetric... follow up with Dr. Glori Bickers in 4 weeks to assess need for increase in dose.

## 2021-01-30 NOTE — Progress Notes (Signed)
Patient ID: Evelyn Shepherd, female    DOB: 29-Feb-1960, 61 y.o.   MRN: [id number]  This visit was conducted in person.  Ht 5' 5.5" (1.664 m)    BMI 38.18 kg/m    CC:  Chief Complaint  Patient presents with   Back Pain    Upper Back around bra strap-Seen Matt 10/25-Elevated ANA-Referred to Rheumatologist and was told she had osteoarthritis and fibromyalgia   Urinary Incontinence   Chest Wall Pain   Headache    Sharp Pain-comes and goes left side of head    Subjective:   HPI: Evelyn Shepherd is a 61 y.o. female  Patient of Dr. Marliss Coots with history of recurrent depression, hypothyroid, chronic low back pain presenting on 01/30/2021  WITH MULTIPLE ISSUES including Back Pain (Upper Back around bra strap-Seen Matt 10/25-Elevated ANA-Referred to Rheumatologist and was told she had osteoarthritis and fibromyalgia), Urinary Incontinence, Chest Wall Pain, and Headache (Sharp Pain-comes and goes left side of head)     Recent diagnosis of fibromyalgia and likely osteoarthritis causing elevated ANA and upper back pain.  Likely also contributing to new chest wall pain. Reviewed OV note from 1/13 from rheumatologist Dr. Posey Pronto. Started on gabapentin 100 mg BID, and daily stretching recommended.  Rec follow up in 4 months.  She reports no SE to gabapentin.. no sedation.  She has no noted any change in chest wall pain, headache or upper back pain.  Upper back pain, chest wall pain ongoing x several months.  Ibuprofen 400 mg off and on... did not helps.. no SE.    She is lexapro for depression.   Headache ache pain noted in last week... pain over left eye.. sudden and sharp last 20-30 min. Feels better when up and moving.  Had acute illness in 10/2020.. cough is persistent, most coughing fits  at night when lying down.  Also had cough before Christmas.  Has noted some hoarse voice.  No allergies or  noted some sinus congestion.  She is a smoker but is trying to cut back.    She has also  noted worsening urinary incontinence.. no dysuria. X 1 month   Notes issues with sneeze and cough, but sometimes with  walking loses control.  No burning,  also urgency. No fever,  increase in frequency.  Relevant past medical, surgical, family and social history reviewed and updated as indicated. Interim medical history since our last visit reviewed. Allergies and medications reviewed and updated. Outpatient Medications Prior to Visit  Medication Sig Dispense Refill   albuterol (VENTOLIN HFA) 108 (90 Base) MCG/ACT inhaler Inhale 1-2 puffs into the lungs every 6 (six) hours as needed (cough). 6.7 g 0   atorvastatin (LIPITOR) 20 MG tablet Take 1 tablet (20 mg total) by mouth daily. 90 tablet 3   escitalopram (LEXAPRO) 20 MG tablet TAKE 1 TABLET BY MOUTH DAILY. 90 tablet 2   fluticasone (FLONASE) 50 MCG/ACT nasal spray Place 2 sprays into both nostrils daily as needed for allergies or rhinitis.     gabapentin (NEURONTIN) 100 MG capsule Take 1 capsule (100 mg total) by mouth 2 (two) times daily 60 capsule 5   ketoconazole (NIZORAL) 2 % cream APPLY TO AREAS OF FACE THAT ARE AFFECTED ONCE DAILY 60 g 0   Magnesium Citrate 125 MG CAPS Take by mouth in the morning and at bedtime.     SUMAtriptan (IMITREX) 100 MG tablet TAKE 1 TABLET BY MOUTH FOR HEADACHE. MAY REPEAT IN 2 HOURS IF HEADACHE  PERSISTS OR RECURS. 27 tablet 2   Vitamin D, Ergocalciferol, (DRISDOL) 1.25 MG (50000 UNIT) CAPS capsule Take 1 capsule (50,000 Units total) by mouth every 7 (seven) days. 12 capsule 0   No facility-administered medications prior to visit.     Per HPI unless specifically indicated in ROS section below Review of Systems  Constitutional:  Negative for fatigue and fever.  HENT:  Negative for congestion.   Eyes:  Negative for pain.  Respiratory:  Negative for cough and shortness of breath.   Cardiovascular:  Negative for chest pain, palpitations and leg swelling.  Gastrointestinal:  Negative for abdominal pain.   Genitourinary:  Negative for dysuria and vaginal bleeding.  Musculoskeletal:  Negative for back pain.  Neurological:  Negative for syncope, light-headedness and headaches.  Psychiatric/Behavioral:  Negative for dysphoric mood.   Objective:  Ht 5' 5.5" (1.664 m)    BMI 38.18 kg/m   Wt Readings from Last 3 Encounters:  12/26/20 233 lb (105.7 kg)  11/04/20 234 lb 4 oz (106.3 kg)  05/13/20 225 lb 1 oz (102.1 kg)      Physical Exam Constitutional:      General: She is not in acute distress.    Appearance: Normal appearance. She is well-developed. She is obese. She is not ill-appearing or toxic-appearing.  HENT:     Head: Normocephalic.     Right Ear: Hearing, tympanic membrane, ear canal and external ear normal. Tympanic membrane is not erythematous, retracted or bulging.     Left Ear: Hearing, tympanic membrane, ear canal and external ear normal. Tympanic membrane is not erythematous, retracted or bulging.     Nose: No mucosal edema or rhinorrhea.     Right Sinus: No maxillary sinus tenderness or frontal sinus tenderness.     Left Sinus: No maxillary sinus tenderness or frontal sinus tenderness.     Mouth/Throat:     Pharynx: Uvula midline.  Eyes:     General: Lids are normal. Lids are everted, no foreign bodies appreciated.     Conjunctiva/sclera: Conjunctivae normal.     Pupils: Pupils are equal, round, and reactive to light.  Neck:     Thyroid: No thyroid mass or thyromegaly.     Vascular: No carotid bruit.     Trachea: Trachea normal.  Cardiovascular:     Rate and Rhythm: Normal rate and regular rhythm.     Pulses: Normal pulses.     Heart sounds: Normal heart sounds, S1 normal and S2 normal. No murmur heard.   No friction rub. No gallop.  Pulmonary:     Effort: Pulmonary effort is normal. No tachypnea or respiratory distress.     Breath sounds: Normal breath sounds. No decreased breath sounds, wheezing, rhonchi or rales.  Abdominal:     General: Bowel sounds are normal.      Palpations: Abdomen is soft.     Tenderness: There is no abdominal tenderness.  Musculoskeletal:     Cervical back: Normal range of motion and neck supple.     Comments:  > 10 trigger points for fibromyalgia including upper back and anterior chest.  Skin:    General: Skin is warm and dry.     Findings: No rash.  Neurological:     Mental Status: She is alert.  Psychiatric:        Mood and Affect: Mood is not anxious or depressed.        Speech: Speech normal.        Behavior: Behavior normal. Behavior  is cooperative.        Thought Content: Thought content normal.        Judgment: Judgment normal.      Results for orders placed or performed during the hospital encounter of 12/16/20  Resp Panel by RT-PCR (Flu A&B, Covid) Nasopharyngeal Swab   Specimen: Nasopharyngeal Swab; Nasopharyngeal(NP) swabs in vial transport medium  Result Value Ref Range   SARS Coronavirus 2 by RT PCR POSITIVE (A) NEGATIVE   Influenza A by PCR NEGATIVE NEGATIVE   Influenza B by PCR NEGATIVE NEGATIVE  Basic metabolic panel  Result Value Ref Range   Sodium 133 (L) 135 - 145 mmol/L   Potassium 4.2 3.5 - 5.1 mmol/L   Chloride 103 98 - 111 mmol/L   CO2 24 22 - 32 mmol/L   Glucose, Bld 138 (H) 70 - 99 mg/dL   BUN 12 6 - 20 mg/dL   Creatinine, Ser 0.68 0.44 - 1.00 mg/dL   Calcium 8.5 (L) 8.9 - 10.3 mg/dL   GFR, Estimated >60 >60 mL/min   Anion gap 6 5 - 15  CBC  Result Value Ref Range   WBC 7.7 4.0 - 10.5 K/uL   RBC 5.29 (H) 3.87 - 5.11 MIL/uL   Hemoglobin 15.7 (H) 12.0 - 15.0 g/dL   HCT 46.2 (H) 36.0 - 46.0 %   MCV 87.3 80.0 - 100.0 fL   MCH 29.7 26.0 - 34.0 pg   MCHC 34.0 30.0 - 36.0 g/dL   RDW 12.8 11.5 - 15.5 %   Platelets 191 150 - 400 K/uL   nRBC 0.0 0.0 - 0.2 %  Hepatic function panel  Result Value Ref Range   Total Protein 6.0 (L) 6.5 - 8.1 g/dL   Albumin 3.7 3.5 - 5.0 g/dL   AST 20 15 - 41 U/L   ALT 20 0 - 44 U/L   Alkaline Phosphatase 50 38 - 126 U/L   Total Bilirubin 0.8 0.3 -  1.2 mg/dL   Bilirubin, Direct 0.2 0.0 - 0.2 mg/dL   Indirect Bilirubin 0.6 0.3 - 0.9 mg/dL  Lipase, blood  Result Value Ref Range   Lipase 27 11 - 51 U/L  Troponin I (High Sensitivity)  Result Value Ref Range   Troponin I (High Sensitivity) 4 <18 ng/L  Troponin I (High Sensitivity)  Result Value Ref Range   Troponin I (High Sensitivity) 4 <18 ng/L    This visit occurred during the SARS-CoV-2 public health emergency.  Safety protocols were in place, including screening questions prior to the visit, additional usage of staff PPE, and extensive cleaning of exam room while observing appropriate contact time as indicated for disinfecting solutions.   COVID 19 screen:  No recent travel or known exposure to COVID19 The patient denies respiratory symptoms of COVID 19 at this time. The importance of social distancing was discussed today.   Assessment and Plan  Urinalysis clear.   Problem List Items Addressed This Visit     Acute nonintractable headache    Along with congestions.. likely post viral.  Start flonase 2 sprays per nostril daily.      Relevant Medications   ibuprofen (ADVIL) 800 MG tablet   B12 deficiency    Normal B12  in 4.2022 on  Monthly B12.. given today.      Chest wall pain   Fibromyalgia - Primary    New diagnosis, now on gabapentin in last 5-6 days.Marland Kitchen given it more time and MD  consider  titrating up if pain  not controlled. Will add antiinflammatory:ibuprofen 800 mg TID prn to help with inflammation and OA component. Has tolerated ibuprofen in past.      Relevant Medications   ibuprofen (ADVIL) 800 MG tablet   Urinary incontinence     No sign of infection. Combination stress and urge incontinence.. will try trial of Myrbetriq.  Recommend  Avoiding anticholinergics given in process of titrating up gabapentin an additional sedating medication.  Consider urology referral for stress component. Start Kegel exercises.      Relevant Medications   mirabegron ER  (MYRBETRIQ) 25 MG TB24 tablet   Other Relevant Orders   POCT Urinalysis Dipstick (Automated) (Completed)   Meds ordered this encounter  Medications   ibuprofen (ADVIL) 800 MG tablet    Sig: Take 1 tablet (800 mg total) by mouth every 8 (eight) hours as needed.    Dispense:  30 tablet    Refill:  0   cyanocobalamin ((VITAMIN B-12)) injection 1,000 mcg   mirabegron ER (MYRBETRIQ) 25 MG TB24 tablet    Sig: Take 1 tablet (25 mg total) by mouth daily.    Dispense:  30 tablet    Refill:  [place], MD

## 2021-01-30 NOTE — Assessment & Plan Note (Signed)
Along with congestions.. likely post viral.  Start flonase 2 sprays per nostril daily.

## 2021-01-30 NOTE — Telephone Encounter (Signed)
Let pt know it is not covered. I do not recommend others at this time given associated SE of sedation and she is in process of increasing gabapentin.. can talk more about it with PCP.

## 2021-01-30 NOTE — Assessment & Plan Note (Signed)
No sign of infection. Combination stress and urge incontinence.. will try trial of Myrbetriq.  Recommend  Avoiding anticholinergics given in process of titrating up gabapentin an additional sedating medication.  Consider urology referral for stress component. Start Kegel exercises.

## 2021-01-30 NOTE — Telephone Encounter (Signed)
Received fax from [place] stating patient must try oxybutynin or ER/ or Toviaz first.  Insurance requires step therapy before myrbetriq will be covered.

## 2021-01-30 NOTE — Telephone Encounter (Signed)
Left detailed information on Lynn's voicemail with below information from pharmacy and Dr. Rometta Emery response.  Pharmacy was notified to cancel Rx for Myrbetriq via fax.

## 2021-01-30 NOTE — Assessment & Plan Note (Signed)
New diagnosis, now on gabapentin in last 5-6 days.Marland Kitchen given it more time and MD  consider  titrating up if pain not controlled. Will add antiinflammatory:ibuprofen 800 mg TID prn to help with inflammation and OA component. Has tolerated ibuprofen in past.

## 2021-02-03 ENCOUNTER — Telehealth: Payer: Self-pay

## 2021-02-03 ENCOUNTER — Ambulatory Visit: Payer: 59 | Admitting: Family Medicine

## 2021-02-03 ENCOUNTER — Other Ambulatory Visit: Payer: Self-pay

## 2021-02-03 NOTE — Telephone Encounter (Signed)
[place] Night - Client Nonclinical Telephone Record  [facility] Client [place] Night - Client Client Site [place] Provider Loura Pardon - MD Contact Type Call Who Is Calling Patient / Member / Family / Caregiver Caller Name [place] Phone Number [phone] Patient Name Evelyn Shepherd Patient DOB [birth year] Call Type Message Only Information Provided Reason for Call Request to [facility] Appointment Initial Comment Caller states she needs to cancel her appointment. Patient request to speak to RN No Additional Comment Provided office hours. Disp. Time Disposition Final User 02/03/2021 7:45:01 AM General Information Provided Yes Benavides, Arianna Call Closed By: Hadassah Pais Transaction Date/Time: 02/03/2021 7:43:02 AM (ET

## 2021-02-03 NOTE — Telephone Encounter (Signed)
Appears appt is already cancelled. Sending to [facility] support. ? If fee is to be charged.

## 2021-02-06 ENCOUNTER — Other Ambulatory Visit: Payer: Self-pay

## 2021-02-19 ENCOUNTER — Other Ambulatory Visit: Payer: Self-pay

## 2021-02-19 ENCOUNTER — Other Ambulatory Visit: Payer: Self-pay | Admitting: Family Medicine

## 2021-02-19 MED ORDER — ESCITALOPRAM OXALATE 20 MG PO TABS
20.0000 mg | ORAL_TABLET | Freq: Every day | ORAL | 1 refills | Status: DC
  Filled 2021-02-19: qty 30, 30d supply, fill #0
  Filled 2021-06-15: qty 30, 30d supply, fill #1
  Filled 2021-07-15: qty 30, 30d supply, fill #2
  Filled 2021-12-21: qty 30, 30d supply, fill #3
  Filled 2022-01-15: qty 30, 30d supply, fill #4
  Filled 2022-02-13: qty 30, 30d supply, fill #5

## 2021-02-19 MED ORDER — SUMATRIPTAN SUCCINATE 100 MG PO TABS
100.0000 mg | ORAL_TABLET | ORAL | 5 refills | Status: DC
  Filled 2021-02-19: qty 10, 30d supply, fill #0
  Filled 2021-06-15: qty 10, 30d supply, fill #1
  Filled 2021-07-15: qty 10, 30d supply, fill #2
  Filled 2021-12-21: qty 10, 30d supply, fill #3
  Filled 2022-01-15: qty 10, 30d supply, fill #4
  Filled 2022-02-13: qty 10, 30d supply, fill #5

## 2021-02-19 MED FILL — Ergocalciferol Cap 1.25 MG (50000 Unit): ORAL | 28 days supply | Qty: 4 | Fill #2 | Status: AC

## 2021-02-19 NOTE — Telephone Encounter (Signed)
Pt has had recent acute appts with other providers recently but last OV with PCP was her CPE on 05/13/20  Imitrex filled on 01/16/20 #27 tabs with 1 refill Lexapro last filled on 01/16/20 #90 tabs with 2 refills

## 2021-05-12 ENCOUNTER — Encounter: Payer: Self-pay | Admitting: Emergency Medicine

## 2021-05-12 ENCOUNTER — Ambulatory Visit
Admission: EM | Admit: 2021-05-12 | Discharge: 2021-05-12 | Disposition: A | Discharge status: Home / Self Care | Payer: 59 | Attending: Emergency Medicine | Admitting: Emergency Medicine

## 2021-05-12 DIAGNOSIS — J029 Acute pharyngitis, unspecified: Secondary | ICD-10-CM

## 2021-05-12 DIAGNOSIS — H6692 Otitis media, unspecified, left ear: Secondary | ICD-10-CM | POA: Diagnosis not present

## 2021-05-12 LAB — POCT RAPID STREP A (OFFICE): Rapid Strep A Screen: NEGATIVE

## 2021-05-12 MED ORDER — AMOXICILLIN 875 MG PO TABS: 875.0000 mg | ORAL_TABLET | Freq: Two times a day (BID) | ORAL | 0 refills | Status: AC

## 2021-05-12 NOTE — Discharge Instructions (Addendum)
Take the amoxicillin as directed for your ear infection and sore throat.  Take Tylenol or ibuprofen as needed for fever or discomfort.  Follow-up with your primary care provider if your symptoms are not improving. ?

## 2021-05-12 NOTE — ED Triage Notes (Signed)
Pt here with sore throat and body aches x 1 day. ?

## 2021-05-12 NOTE — ED Provider Notes (Signed)
?[facility] ? ? ? ?CSN: [id number] ?Arrival date & time: 05/12/21  1656 ? ? ?  ? ?History   ?Chief Complaint ?Chief Complaint  ?Patient presents with  ? Sore Throat  ? Otalgia  ? Generalized Body Aches  ? Neck Pain  ? ? ?HPI ?Evelyn Shepherd is a 61 y.o. female.  Patient presents with body aches, ear pain, sore throat x1 day.  Several OTC medications taken without relief.  She denies fever, rash, cough, shortness of breath, vomiting, diarrhea, or other symptoms.  Her medical history includes migraine headache, prediabetes, hypothyroidism, GERD, depression, chronic low back pain. ? ?The history is provided by the patient and medical records.  ? ?Past Medical History:  ?Diagnosis Date  ? History of depression   ? History of gastroesophageal reflux (GERD)   ? History of hyperlipidemia   ? History of migraine   ? History of obesity   ? lap band surgery  ? History of pneumonia 2005  ? ? ?Patient Active Problem List  ? Diagnosis Date Noted  ? Urinary incontinence 01/30/2021  ? Fibromyalgia 01/30/2021  ? Acute nonintractable headache 01/30/2021  ? Need for immunization against influenza 11/04/2020  ? Vitamin D deficiency 05/06/2020  ? Acute bilateral thoracic back pain 11/06/2019  ? Meralgia paraesthetica, left 11/06/2019  ? Hip pain 05/01/2019  ? Muscle cramping 05/01/2019  ? Bariatric surgery status 05/01/2019  ? Urinary frequency 11/23/2018  ? Screening mammogram, encounter for 11/23/2018  ? B12 deficiency 11/23/2018  ? Vertigo 07/17/2018  ? Former smoker 06/08/2017  ? History of shingles 01/22/2016  ? Bursitis of left hip 01/29/2015  ? Lumbar pain 12/26/2013  ? Brachioradial pruritus 11/07/2013  ? Fatigue 11/07/2013  ? Encounter for routine gynecological examination 04/11/2013  ? Chest wall pain 04/11/2013  ? Plantar fasciitis 04/11/2013  ? Colon cancer screening 02/13/2013  ? Screening for breast cancer 04/13/2011  ? Special screening for malignant neoplasms, colon 04/13/2011  ? Seborrheic dermatitis 04/13/2011   ? Hypothyroidism 08/11/2007  ? LOW BACK PAIN, CHRONIC 07/14/2006  ? Prediabetes 07/06/2006  ? Hyperlipidemia 07/06/2006  ? Depression, recurrent ([place]) 07/06/2006  ? Migraine without aura 07/06/2006  ? GERD 07/06/2006  ? ? ?Past Surgical History:  ?Procedure Laterality Date  ? AUGMENTATION MAMMAPLASTY Bilateral 1991  ? BREAST ENHANCEMENT SURGERY    ? CESAREAN SECTION    ? DENTAL SURGERY    ? LAPAROSCOPIC GASTRIC BANDING  07/31/07  ? LUMBAR DISC SURGERY    ? TONGUE SURGERY  03/2005  ? lesion removal  ? ? ?OB History   ?No obstetric history on file. ?  ? ? ? ?Home Medications   ? ?Prior to Admission medications   ?Medication Sig Start Date End Date Taking? Authorizing Provider  ?amoxicillin (AMOXIL) 875 MG tablet Take 1 tablet (875 mg total) by mouth 2 (two) times daily for 10 days. 05/12/21 05/22/21 Yes Sharion Balloon, NP  ?albuterol (VENTOLIN HFA) 108 (90 Base) MCG/ACT inhaler Inhale 1-2 puffs into the lungs every 6 (six) hours as needed (cough). ?Patient not taking: Reported on 01/30/2021 09/14/20   Serafina Royals, FNP  ?atorvastatin (LIPITOR) 20 MG tablet Take 1 tablet (20 mg total) by mouth daily. 05/13/20   Tower, Wynelle Fanny, MD  ?escitalopram (LEXAPRO) 20 MG tablet TAKE 1 TABLET BY MOUTH DAILY. 02/19/21   Tower, Wynelle Fanny, MD  ?fluticasone (FLONASE) 50 MCG/ACT nasal spray Place 2 sprays into both nostrils daily as needed for allergies or rhinitis.    [provider]  ?  gabapentin (NEURONTIN) 100 MG capsule Take 1 capsule (100 mg total) by mouth 2 (two) times daily 01/23/21     ?ibuprofen (ADVIL) 800 MG tablet Take 1 tablet (800 mg total) by mouth every 8 (eight) hours as needed. 01/30/21   Bedsole, Amy E, MD  ?ketoconazole (NIZORAL) 2 % cream APPLY TO AREAS OF FACE THAT ARE AFFECTED ONCE DAILY 01/26/18   Tower, Wynelle Fanny, MD  ?Magnesium Citrate 125 MG CAPS Take by mouth in the morning and at bedtime.    [provider]  ?SUMAtriptan (IMITREX) 100 MG tablet TAKE 1 TABLET BY MOUTH FOR HEADACHE. MAY REPEAT IN 2 HOURS  IF HEADACHE PERSISTS OR RECURS. 02/19/21   Tower, Wynelle Fanny, MD  ?Vitamin D, Ergocalciferol, (DRISDOL) 1.25 MG (50000 UNIT) CAPS capsule Take 1 capsule (50,000 Units total) by mouth every 7 (seven) days. 10/06/20   Tower, Wynelle Fanny, MD  ?mirabegron ER (MYRBETRIQ) 25 MG TB24 tablet Take 1 tablet (25 mg total) by mouth daily. 01/30/21 01/30/21  Jinny Sanders, MD  ? ? ?Family History ?Family History  ?Problem Relation Age of Onset  ? Kidney cancer Mother   ? Diabetes Mother   ? Heart attack Mother   ? Pneumonia Father   ?     died  ? Lung cancer Father   ? Depression Brother   ?     commited suicide  ? Alcohol abuse Brother   ? Breast cancer Paternal Aunt   ? Breast cancer Paternal Aunt   ? Early menopause Other   ?     family  ? Colon cancer Neg Hx   ? Pancreatic cancer Neg Hx   ? Rectal cancer Neg Hx   ? Stomach cancer Neg Hx   ? ? ?Social History ?Social History  ? ?Tobacco Use  ? Smoking status: Some Days  ?  Packs/day: 1.50  ?  Years: 12.00  ?  Pack years: 18.00  ?  Types: Cigarettes  ?  Last attempt to quit: 09/25/2017  ?  Years since quitting: 3.6  ? Smokeless tobacco: Never  ? Tobacco comments:  ?  Smokes 1 cigarette daily   ?Vaping Use  ? Vaping Use: Never used  ?Substance Use Topics  ? Alcohol use: Yes  ?  Alcohol/week: 0.0 standard drinks  ?  Comment: occasional on monthly basis  ? Drug use: No  ? ? ? ?Allergies   ?Meloxicam and Sulfonamide derivatives ? ? ?Review of Systems ?Review of Systems  ?Constitutional:  Negative for chills and fever.  ?HENT:  Positive for ear pain and sore throat.   ?Respiratory:  Negative for cough and shortness of breath.   ?Cardiovascular:  Negative for chest pain and palpitations.  ?Gastrointestinal:  Negative for diarrhea and vomiting.  ?Skin:  Negative for color change and rash.  ?All other systems reviewed and are negative. ? ? ?Physical Exam ?Triage Vital Signs ?ED Triage Vitals  ?Enc Vitals Group  ?   BP 05/12/21 1740 125/76  ?   Pulse Rate 05/12/21 1740 88  ?   Resp 05/12/21 1740  18  ?   Temp 05/12/21 1740 98.1 ?F (36.7 ?C)  ?   Temp src --   ?   SpO2 05/12/21 1740 95 %  ?   Weight --   ?   Height --   ?   Head Circumference --   ?   Peak Flow --   ?   Pain Score 05/12/21 1737 6  ?  Pain Loc --   ?   Pain Edu? --   ?   Excl. in [place]? --   ? ?No data found. ? ?Updated Vital Signs ?BP 125/76   Pulse 88   Temp 98.1 ?F (36.7 ?C)   Resp 18   SpO2 95%  ? ?Visual Acuity ?Right Eye Distance:   ?Left Eye Distance:   ?Bilateral Distance:   ? ?Right Eye Near:   ?Left Eye Near:    ?Bilateral Near:    ? ?Physical Exam ?Vitals and nursing note reviewed.  ?Constitutional:   ?   General: She is not in acute distress. ?   Appearance: She is well-developed. She is not ill-appearing.  ?HENT:  ?   Right Ear: A middle ear effusion is present.  ?   Left Ear: Tympanic membrane is erythematous.  ?   Nose: Nose normal.  ?   Mouth/Throat:  ?   Mouth: Mucous membranes are moist.  ?   Pharynx: Posterior oropharyngeal erythema present.  ?Cardiovascular:  ?   Rate and Rhythm: Normal rate and regular rhythm.  ?   Heart sounds: Normal heart sounds.  ?Pulmonary:  ?   Effort: Pulmonary effort is normal. No respiratory distress.  ?   Breath sounds: Normal breath sounds.  ?Musculoskeletal:  ?   Cervical back: Neck supple.  ?Skin: ?   General: Skin is warm and dry.  ?Neurological:  ?   Mental Status: She is alert.  ?Psychiatric:     ?   Mood and Affect: Mood normal.     ?   Behavior: Behavior normal.  ? ? ? ?UC Treatments / Results  ?Labs ?(all labs ordered are listed, but only abnormal results are displayed) ?Labs Reviewed  ?POCT RAPID STREP A (OFFICE)  ? ? ?EKG ? ? ?Radiology ?No results found. ? ?Procedures ?Procedures (including critical care time) ? ?Medications Ordered in UC ?Medications - No data to display ? ?Initial Impression / Assessment and Plan / UC Course  ?I have reviewed the triage vital signs and the nursing notes. ? ?Pertinent labs & imaging results that were available during my care of the patient were  reviewed by me and considered in my medical decision making (see chart for details). ? ?Left otitis media, sore throat.  Rapid strep negative.  Treating with amoxicillin.  Discussed Tylenol or ibuprofen as n

## 2021-05-18 ENCOUNTER — Other Ambulatory Visit: Payer: Self-pay

## 2021-05-18 ENCOUNTER — Telehealth (INDEPENDENT_AMBULATORY_CARE_PROVIDER_SITE_OTHER): Payer: 59 | Admitting: Family Medicine

## 2021-05-18 ENCOUNTER — Encounter: Payer: Self-pay | Admitting: Family Medicine

## 2021-05-18 DIAGNOSIS — U071 COVID-19: Secondary | ICD-10-CM | POA: Insufficient documentation

## 2021-05-18 MED ORDER — PREDNISONE 10 MG PO TABS
ORAL_TABLET | ORAL | 0 refills | Status: DC
  Filled 2021-05-18: qty 30, 12d supply, fill #0

## 2021-05-18 NOTE — Assessment & Plan Note (Signed)
In a non immunized pt  ?Day 8 of symptoms with ST and ear pain (taking abx after UC visit for ear infx)  ?Too many days for anti viral ?Some sob/no cough  ?Px prednisone for chest tightness/ should help throat and ear symptoms as well  ?Disc poss side eff ?inst to use tylenol for fever/pain  ?Nasal spray as needed ?Fluids /rest ?Rev isolation protocol ?Ref ER precautions ?Update if not starting to improve in a week or if worsening   ?

## 2021-05-18 NOTE — Patient Instructions (Signed)
Drink fluids and rest  ?Take the prednisone taper (instead of advil)  ?It may make you feel hyper and hungry  ? ?Tylenol for fever/aches/pain  ?Finish the antibiotics for the ear  ? ?Continue to isolate yourself until symptoms are better  ? ? ? ?

## 2021-05-18 NOTE — Progress Notes (Signed)
Virtual Visit via Video Note ? ?I connected with Evelyn Shepherd on 05/18/21 at 12:00 PM EDT by a video enabled telemedicine application and verified that I am speaking with the correct person using two identifiers. ? ?Location: ?Patient: home (car) ?Provider: office  ?  ?I discussed the limitations of evaluation and management by telemedicine and the availability of in person appointments. The patient expressed understanding and agreed to proceed. ? ?Parties involved in encounter ? ?Patient: Evelyn Shepherd  ? ?Provider:  Loura Pardon MD  ? ?History of Present Illness: ?Pt presents for covid 19 ? ?Started 8 days ago  ?ST- started with/got much worse  ?Ear pain -then worse/went to UC - fx with fluid on ears, L ear infection and neg strep test  ?Did not do covid test  ?Home covid test then was neg  ?Fever/chills (hot and cold and body aches)  ?Took covid tests today  ?Headache ?Congestion is not too bad  ?Drinking fluids/ all water  ? ?Not a lot of cough  ?Just labored breathing when she does something  ?Fatigue  ? ?Taking abx for ear infection  ?Amoxicillin 825 mg bid  ?Throat still hurts /ears hurt  ? ?Otc ?Advil-helps just a little bit  ?Flonase  ? ?Lab Results  ?Component Value Date  ? CREATININE 0.68 12/16/2020  ? BUN 12 12/16/2020  ? NA 133 (L) 12/16/2020  ? K 4.2 12/16/2020  ? CL 103 12/16/2020  ? CO2 24 12/16/2020  ?GFR over 60 ? ?  ?Patient Active Problem List  ? Diagnosis Date Noted  ? COVID-19 05/18/2021  ? Urinary incontinence 01/30/2021  ? Fibromyalgia 01/30/2021  ? Acute nonintractable headache 01/30/2021  ? Need for immunization against influenza 11/04/2020  ? Vitamin D deficiency 05/06/2020  ? Acute bilateral thoracic back pain 11/06/2019  ? Meralgia paraesthetica, left 11/06/2019  ? Hip pain 05/01/2019  ? Muscle cramping 05/01/2019  ? Bariatric surgery status 05/01/2019  ? Urinary frequency 11/23/2018  ? Screening mammogram, encounter for 11/23/2018  ? B12 deficiency 11/23/2018  ? Vertigo 07/17/2018  ?  Former smoker 06/08/2017  ? History of shingles 01/22/2016  ? Bursitis of left hip 01/29/2015  ? Lumbar pain 12/26/2013  ? Brachioradial pruritus 11/07/2013  ? Fatigue 11/07/2013  ? Encounter for routine gynecological examination 04/11/2013  ? Chest wall pain 04/11/2013  ? Plantar fasciitis 04/11/2013  ? Colon cancer screening 02/13/2013  ? Screening for breast cancer 04/13/2011  ? Special screening for malignant neoplasms, colon 04/13/2011  ? Seborrheic dermatitis 04/13/2011  ? Hypothyroidism 08/11/2007  ? LOW BACK PAIN, CHRONIC 07/14/2006  ? Prediabetes 07/06/2006  ? Hyperlipidemia 07/06/2006  ? Depression, recurrent ([place]) 07/06/2006  ? Migraine without aura 07/06/2006  ? GERD 07/06/2006  ? ?Past Medical History:  ?Diagnosis Date  ? History of depression   ? History of gastroesophageal reflux (GERD)   ? History of hyperlipidemia   ? History of migraine   ? History of obesity   ? lap band surgery  ? History of pneumonia 2005  ? ?Past Surgical History:  ?Procedure Laterality Date  ? AUGMENTATION MAMMAPLASTY Bilateral 1991  ? BREAST ENHANCEMENT SURGERY    ? CESAREAN SECTION    ? DENTAL SURGERY    ? LAPAROSCOPIC GASTRIC BANDING  07/31/07  ? LUMBAR DISC SURGERY    ? TONGUE SURGERY  03/2005  ? lesion removal  ? ?Social History  ? ?Tobacco Use  ? Smoking status: Some Days  ?  Packs/day: 1.50  ?  Years: 12.00  ?  Pack years: 18.00  ?  Types: Cigarettes  ?  Last attempt to quit: 09/25/2017  ?  Years since quitting: 3.6  ? Smokeless tobacco: Never  ? Tobacco comments:  ?  Smokes 1 cigarette daily   ?Vaping Use  ? Vaping Use: Never used  ?Substance Use Topics  ? Alcohol use: Yes  ?  Alcohol/week: 0.0 standard drinks  ?  Comment: occasional on monthly basis  ? Drug use: No  ? ?Family History  ?Problem Relation Age of Onset  ? Kidney cancer Mother   ? Diabetes Mother   ? Heart attack Mother   ? Pneumonia Father   ?     died  ? Lung cancer Father   ? Depression Brother   ?     commited suicide  ? Alcohol abuse Brother   ? Breast  cancer Paternal Aunt   ? Breast cancer Paternal Aunt   ? Early menopause Other   ?     family  ? Colon cancer Neg Hx   ? Pancreatic cancer Neg Hx   ? Rectal cancer Neg Hx   ? Stomach cancer Neg Hx   ? ?Allergies  ?Allergen Reactions  ? Meloxicam   ?  REACTION: unspecified  ? Sulfonamide Derivatives   ?  REACTION: unspecified  ? ?Current Outpatient Medications on File Prior to Visit  ?Medication Sig Dispense Refill  ? albuterol (VENTOLIN HFA) 108 (90 Base) MCG/ACT inhaler Inhale 1-2 puffs into the lungs every 6 (six) hours as needed (cough). 6.7 g 0  ? amoxicillin (AMOXIL) 875 MG tablet Take 1 tablet (875 mg total) by mouth 2 (two) times daily for 10 days. 20 tablet 0  ? atorvastatin (LIPITOR) 20 MG tablet Take 1 tablet (20 mg total) by mouth daily. 90 tablet 3  ? escitalopram (LEXAPRO) 20 MG tablet TAKE 1 TABLET BY MOUTH DAILY. 90 tablet 1  ? fluticasone (FLONASE) 50 MCG/ACT nasal spray Place 2 sprays into both nostrils daily as needed for allergies or rhinitis.    ? gabapentin (NEURONTIN) 100 MG capsule Take 1 capsule (100 mg total) by mouth 2 (two) times daily 60 capsule 5  ? ibuprofen (ADVIL) 800 MG tablet Take 1 tablet (800 mg total) by mouth every 8 (eight) hours as needed. 30 tablet 0  ? ketoconazole (NIZORAL) 2 % cream APPLY TO AREAS OF FACE THAT ARE AFFECTED ONCE DAILY 60 g 0  ? Magnesium Citrate 125 MG CAPS Take by mouth in the morning and at bedtime.    ? SUMAtriptan (IMITREX) 100 MG tablet TAKE 1 TABLET BY MOUTH FOR HEADACHE. MAY REPEAT IN 2 HOURS IF HEADACHE PERSISTS OR RECURS. 27 tablet 5  ? Vitamin D, Ergocalciferol, (DRISDOL) 1.25 MG (50000 UNIT) CAPS capsule Take 1 capsule (50,000 Units total) by mouth every 7 (seven) days. 12 capsule 0  ? [DISCONTINUED] mirabegron ER (MYRBETRIQ) 25 MG TB24 tablet Take 1 tablet (25 mg total) by mouth daily. 30 tablet 11  ? ?No current facility-administered medications on file prior to visit.  ? Review of Systems  ?Constitutional:  Positive for chills, fever and  malaise/fatigue.  ?HENT:  Positive for ear pain and sore throat. Negative for congestion and sinus pain.   ?Eyes:  Negative for blurred vision, discharge and redness.  ?Respiratory:  Positive for shortness of breath. Negative for cough, sputum production, wheezing and stridor.   ?Cardiovascular:  Negative for chest pain, palpitations and leg swelling.  ?Gastrointestinal:  Negative for abdominal pain, diarrhea, nausea and vomiting.  ?  Musculoskeletal:  Negative for myalgias.  ?Skin:  Negative for rash.  ?Neurological:  Positive for headaches. Negative for dizziness.   ? ?Observations/Objective: ?Patient appears well, in no distress (seems fatigued)  ?Weight is baseline  ?No facial swelling or asymmetry ?Normal voice-not hoarse and no slurred speech ?No obvious tremor or mobility impairment ?Moving neck and UEs normally ?Able to hear the call well  ?No cough or shortness of breath during interview  ?Talkative and mentally sharp with no cognitive changes ?No skin changes on face or neck , no rash or pallor ?Affect is normal  ? ? ?Assessment and Plan: ?Problem List Items Addressed This Visit   ? ?  ? Other  ? COVID-19  ?  In a non immunized pt  ?Day 8 of symptoms with ST and ear pain (taking abx after UC visit for ear infx)  ?Too many days for anti viral ?Some sob/no cough  ?Px prednisone for chest tightness/ should help throat and ear symptoms as well  ?Disc poss side eff ?inst to use tylenol for fever/pain  ?Nasal spray as needed ?Fluids /rest ?Rev isolation protocol ?Ref ER precautions ?Update if not starting to improve in a week or if worsening   ? ?  ?  ? ? ? ?Follow Up Instructions: ?Drink fluids and rest  ?Take the prednisone taper (instead of advil)  ?It may make you feel hyper and hungry  ? ?Tylenol for fever/aches/pain  ?Finish the antibiotics for the ear  ? ?Continue to isolate yourself until symptoms are better  ?  ?I discussed the assessment and treatment plan with the patient. The patient was provided an  opportunity to ask questions and all were answered. The patient agreed with the plan and demonstrated an understanding of the instructions. ?  ?The patient was advised to call back or seek an in-person evalua

## 2021-06-15 ENCOUNTER — Other Ambulatory Visit: Payer: Self-pay | Admitting: Family Medicine

## 2021-06-15 ENCOUNTER — Other Ambulatory Visit: Payer: Self-pay

## 2021-06-16 ENCOUNTER — Other Ambulatory Visit: Payer: Self-pay

## 2021-06-16 MED FILL — Atorvastatin Calcium Tab 20 MG (Base Equivalent): ORAL | 30 days supply | Qty: 30 | Fill #0 | Status: AC

## 2021-07-03 ENCOUNTER — Encounter: Payer: Self-pay | Admitting: Emergency Medicine

## 2021-07-03 ENCOUNTER — Ambulatory Visit
Admission: EM | Admit: 2021-07-03 | Discharge: 2021-07-03 | Disposition: A | Discharge status: Home / Self Care | Payer: 59

## 2021-07-03 DIAGNOSIS — J0100 Acute maxillary sinusitis, unspecified: Secondary | ICD-10-CM

## 2021-07-03 MED ORDER — AMOXICILLIN-POT CLAVULANATE 875-125 MG PO TABS: 1.0000 | ORAL_TABLET | Freq: Two times a day (BID) | ORAL | 0 refills | Status: AC

## 2021-07-15 ENCOUNTER — Other Ambulatory Visit: Payer: Self-pay | Admitting: Family Medicine

## 2021-07-15 ENCOUNTER — Other Ambulatory Visit: Payer: Self-pay

## 2021-07-15 MED ORDER — IBUPROFEN 800 MG PO TABS
800.0000 mg | ORAL_TABLET | Freq: Three times a day (TID) | ORAL | 3 refills | Status: DC | PRN
  Filled 2021-07-15: qty 30, 10d supply, fill #0
  Filled 2022-02-13: qty 30, 10d supply, fill #1
  Filled 2022-03-30: qty 30, 10d supply, fill #2
  Filled 2022-06-14: qty 30, 10d supply, fill #3

## 2021-07-15 MED ORDER — VITAMIN D (ERGOCALCIFEROL) 1.25 MG (50000 UNIT) PO CAPS
50000.0000 [IU] | ORAL_CAPSULE | ORAL | 0 refills | Status: DC
  Filled 2021-07-15: qty 4, 28d supply, fill #0
  Filled 2021-12-22: qty 4, 28d supply, fill #1
  Filled 2022-01-15: qty 4, 28d supply, fill #2

## 2021-07-15 MED ORDER — KETOCONAZOLE 2 % EX CREA
TOPICAL_CREAM | Freq: Every day | CUTANEOUS | 0 refills | Status: DC
  Filled 2021-07-15: qty 30, 30d supply, fill #0
  Filled 2022-01-15: qty 60, fill #1
  Filled 2022-01-15: qty 30, 30d supply, fill #1

## 2021-07-15 MED ORDER — MECLIZINE HCL 25 MG PO TABS
25.0000 mg | ORAL_TABLET | ORAL | 0 refills | Status: DC | PRN
Start: 2021-07-15 — End: 2023-09-08
  Filled 2021-07-15 – 2022-02-13 (×2): qty 30, 30d supply, fill #0

## 2021-07-15 MED ORDER — FLUTICASONE PROPIONATE 50 MCG/ACT NA SUSP
2.0000 | Freq: Every day | NASAL | 1 refills | Status: DC | PRN
  Filled 2021-07-15: qty 16, 30d supply, fill #0

## 2021-07-15 MED FILL — Atorvastatin Calcium Tab 20 MG (Base Equivalent): ORAL | 30 days supply | Qty: 30 | Fill #1 | Status: AC

## 2021-07-15 NOTE — Telephone Encounter (Signed)
Last office visit 05/18/2021 for Covid Positive.  Last refilled  01/30/2021 for #30 with no refills by Dr. Diona Browner. No future appointments.

## 2021-07-16 ENCOUNTER — Other Ambulatory Visit: Payer: Self-pay

## 2021-08-06 ENCOUNTER — Other Ambulatory Visit: Payer: Self-pay

## 2021-08-06 ENCOUNTER — Telehealth (INDEPENDENT_AMBULATORY_CARE_PROVIDER_SITE_OTHER): Payer: 59 | Admitting: Nurse Practitioner

## 2021-08-06 ENCOUNTER — Encounter: Payer: Self-pay | Admitting: Nurse Practitioner

## 2021-08-06 DIAGNOSIS — J0101 Acute recurrent maxillary sinusitis: Secondary | ICD-10-CM | POA: Diagnosis not present

## 2021-08-06 MED ORDER — PREDNISONE 20 MG PO TABS
20.0000 mg | ORAL_TABLET | Freq: Every day | ORAL | 0 refills | Status: AC
  Filled 2021-08-06: qty 5, 5d supply, fill #0

## 2021-08-06 MED ORDER — DOXYCYCLINE HYCLATE 100 MG PO TABS
100.0000 mg | ORAL_TABLET | Freq: Two times a day (BID) | ORAL | 0 refills | Status: AC
  Filled 2021-08-06: qty 14, 7d supply, fill #0

## 2021-08-06 NOTE — Progress Notes (Signed)
Patient ID: Evelyn Shepherd, female    DOB: September 09, 1960, 61 y.o.   MRN: [id number]  Virtual visit completed through [place], a video enabled telemedicine application. Due to national recommendations of social distancing due to COVID-19, a virtual visit is felt to be most appropriate for this patient at this time. Reviewed limitations, risks, security and privacy concerns of performing a virtual visit and the availability of in person appointments. I also reviewed that there may be a patient responsible charge related to this service. The patient agreed to proceed.   Patient location: home Provider location: [facility], office Persons participating in this virtual visit: patient, provider   If any vitals were documented, they were collected by patient at home unless specified below.    There were no vitals taken for this visit.   CC: Sinus pressure Subjective:   HPI: Evelyn Shepherd is a 61 y.o. female presenting on 08/06/2021 for Sinus Problem (Sx started around 08/01/21- Facial swelling on the right side, sinus pain and pressure, a little sore throat earlier today. No headache. Has been using Flonase, Sudafed, Ibuprofen. Did not take a Covid test.)   Symptoms started on 08/01/2021 No sick contacts No covid test, no vaccine Has been using flonase, sudafed, and ibuprofen without relief      Relevant past medical, surgical, family and social history reviewed and updated as indicated. Interim medical history since our last visit reviewed. Allergies and medications reviewed and updated. Outpatient Medications Prior to Visit  Medication Sig Dispense Refill   albuterol (VENTOLIN HFA) 108 (90 Base) MCG/ACT inhaler Inhale 1-2 puffs into the lungs every 6 (six) hours as needed (cough). 6.7 g 0   atorvastatin (LIPITOR) 20 MG tablet Take 1 tablet (20 mg total) by mouth daily. 90 tablet 0   escitalopram (LEXAPRO) 20 MG tablet TAKE 1 TABLET BY MOUTH DAILY. 90 tablet 1   fluticasone  (FLONASE) 50 MCG/ACT nasal spray Place 2 sprays into both nostrils daily as needed for allergies or rhinitis. 16 g 1   ibuprofen (ADVIL) 800 MG tablet Take 1 tablet (800 mg total) by mouth every 8 (eight) hours as needed (with food). 30 tablet 3   ketoconazole (NIZORAL) 2 % cream Apply topically daily. 60 g 0   Magnesium Citrate 125 MG CAPS Take by mouth in the morning and at bedtime.     meclizine (ANTIVERT) 25 MG tablet Take 1 tablet (25 mg total) by mouth as needed for dizziness. 30 tablet 0   SUMAtriptan (IMITREX) 100 MG tablet TAKE 1 TABLET BY MOUTH FOR HEADACHE. MAY REPEAT IN 2 HOURS IF HEADACHE PERSISTS OR RECURS. 27 tablet 5   Vitamin D, Ergocalciferol, (DRISDOL) 1.25 MG (50000 UNIT) CAPS capsule Take 1 capsule (50,000 Units total) by mouth every 7 (seven) days. 12 capsule 0   gabapentin (NEURONTIN) 100 MG capsule Take 1 capsule (100 mg total) by mouth 2 (two) times daily (Patient not taking: Reported on 08/06/2021) 60 capsule 5   mirabegron ER (MYRBETRIQ) 25 MG TB24 tablet Take 1 tablet (25 mg total) by mouth daily. 30 tablet 11   predniSONE (DELTASONE) 10 MG tablet Take 4 pills once daily by mouth for 3 days, then 3 pills daily for 3 days, then 2 pills daily for 3 days then 1 pill daily for 3 days then stop 30 tablet 0   No facility-administered medications prior to visit.     Per HPI unless specifically indicated in ROS section below Review of Systems  Constitutional:  Positive for fatigue. Negative for appetite change, chills and fever.  HENT:  Positive for congestion, ear pain (right ear feels funny), sinus pressure, sinus pain and sneezing. Negative for sore throat.   Respiratory:  Negative for cough and shortness of breath.   Cardiovascular:  Negative for chest pain.  Musculoskeletal:  Negative for arthralgias and myalgias.  Neurological:  Negative for dizziness and headaches.   Objective:  There were no vitals taken for this visit.  Wt Readings from Last 3 Encounters:   05/18/21 230 lb (104.3 kg)  01/30/21 229 lb 6 oz (104 kg)  12/26/20 233 lb (105.7 kg)       Physical exam: Gen: alert, NAD, not ill appearing Pulm: speaks in complete sentences without increased work of breathing Psych: normal mood, normal thought content      Results for orders placed or performed during the hospital encounter of 05/12/21  POCT rapid strep A  Result Value Ref Range   Rapid Strep A Screen Negative Negative   Assessment & Plan:   Problem List Items Addressed This Visit       Respiratory   Acute recurrent maxillary sinusitis - Primary    Patient signs and symptoms consistent with acute sinusitis.  Patient was recently treated for the same approximately month ago with Augmentin for 10 days.  States she does not feel entirely resolved we will try different antibiotic this time doxycycline 100 mg twice daily for 7 days.  Also write some prednisone 20 mg once daily for 5 days.  Steroid precautions given inclusive of take medication with food and avoid NSAIDs while on medication.  Patient will follow-up if no improvement      Relevant Medications   doxycycline (VIBRA-TABS) 100 MG tablet   predniSONE (DELTASONE) 20 MG tablet     Meds ordered this encounter  Medications   doxycycline (VIBRA-TABS) 100 MG tablet    Sig: Take 1 tablet (100 mg total) by mouth 2 (two) times daily for 7 days.    Dispense:  14 tablet    Refill:  0    Order Specific Question:   Supervising Provider    Answer:   Glori Bickers, MARNE A [[id number]]   predniSONE (DELTASONE) 20 MG tablet    Sig: Take 1 tablet (20 mg total) by mouth daily with breakfast for 5 days.    Dispense:  5 tablet    Refill:  0    Order Specific Question:   Supervising Provider    Answer:   TOWER, MARNE A [[id number]]   No orders of the defined types were placed in this encounter.   I discussed the assessment and treatment plan with the patient. The patient was provided an opportunity to ask questions and all were answered. The  patient agreed with the plan and demonstrated an understanding of the instructions. The patient was advised to call back or seek an in-person evaluation if the symptoms worsen or if the condition fails to improve as anticipated.  Follow up plan: Return if symptoms worsen or fail to improve.  Romilda Garret, NP

## 2021-08-06 NOTE — Assessment & Plan Note (Signed)
Patient signs and symptoms consistent with acute sinusitis.  Patient was recently treated for the same approximately month ago with Augmentin for 10 days.  States she does not feel entirely resolved we will try different antibiotic this time doxycycline 100 mg twice daily for 7 days.  Also write some prednisone 20 mg once daily for 5 days.  Steroid precautions given inclusive of take medication with food and avoid NSAIDs while on medication.  Patient will follow-up if no improvement

## 2021-08-12 ENCOUNTER — Encounter: Payer: Self-pay | Admitting: Nurse Practitioner

## 2021-08-12 ENCOUNTER — Ambulatory Visit: Payer: 59 | Admitting: Nurse Practitioner

## 2021-08-12 VITALS — BP 104/68 | HR 82 | Temp 96.5°F | Resp 12 | Wt 231.2 lb

## 2021-08-12 DIAGNOSIS — J3489 Other specified disorders of nose and nasal sinuses: Secondary | ICD-10-CM | POA: Diagnosis not present

## 2021-08-12 NOTE — Assessment & Plan Note (Signed)
Hesitant to start patient on a fourth antibiotic.  She also just recently finished prednisone.  We did have a short discussion we will refer patient to ear nose and throat in [place].  She will reach out to me if her symptoms take a turn for the worse or she starts feeling poorly.  Patient is blowing out some mucus but it is clear she has been using Nettie pot without great relief.  We will tell her to use Afrin as directed no more than 3 days.  Patient will follow-up if no improvement ambulatory referral placed for ENT today.

## 2021-08-12 NOTE — Progress Notes (Addendum)
Acute Office Visit  Subjective:     Patient ID: Evelyn Shepherd, female    DOB: 08/02/1960, 61 y.o.   MRN: [id number]  Chief Complaint  Patient presents with   Sinus Problem    Was seen on 7/27 for sinus infection, symptoms have not improved her sinuses still hurt on the right side and feeling congested. No fever.     Patient is in today for Sinus issues  Interval history: Seen and evaluated on 05/12/2021 in urgent care diagnosed with left otitis media and placed on amoxicillin.  Patient was then reevaluated on 05/18/2021 by primary care provider via video visit.  No change in treatment at that point.  Patient was again seen in urgent care on 07/03/2021 diagnosed with acute nonrecurrent maxillary sinusitis and placed on Augmentin along with prednisone.  Patient had a video visit subsequent with me on 08/06/2021 with recurrent of symptoms.  She was placed on doxycycline along with a short course of prednisone.  Patient is in today with follow-up without great improvement in symptoms.  She was seen and placed on doxycycline and prednisone without great improvement.  Right sided tenderness that has not improved. Right sided congestion without relief.   Currently using flonase. Has tired antihistamine without relief  Review of Systems  Constitutional:  Positive for malaise/fatigue. Negative for chills and fever.  HENT:  Positive for ear pain (full and popping.) and sinus pain. Negative for sore throat.         Objective:    BP 104/68   Pulse 82   Temp (!) 96.5 F (35.8 C) (Temporal)   Resp 12   Wt 231 lb 4 oz (104.9 kg)   SpO2 96%   BMI 37.90 kg/m    Physical Exam Vitals and nursing note reviewed.  Constitutional:      Appearance: Normal appearance.  HENT:     Right Ear: Ear canal and external ear normal.     Left Ear: Tympanic membrane, ear canal and external ear normal.     Ears:     Comments: Fluid behind right TM    Nose:     Right Sinus: Maxillary sinus tenderness and  frontal sinus tenderness present.     Left Sinus: No maxillary sinus tenderness or frontal sinus tenderness.     Comments: No large turbinates or obstruction or abscess appreciated on nare exam.  Right nostril was never the left nostril.    Mouth/Throat:     Mouth: Mucous membranes are moist.     Pharynx: Oropharynx is clear.  Neurological:     Mental Status: She is alert.     No results found for any visits on 08/12/21.      Assessment & Plan:   Problem List Items Addressed This Visit       Other   Sinus pressure - Primary    Hesitant to start patient on a fourth antibiotic.  She also just recently finished prednisone.  We did have a short discussion we will refer patient to ear nose and throat in [place].  She will reach out to me if her symptoms take a turn for the worse or she starts feeling poorly.  Patient is blowing out some mucus but it is clear she has been using Nettie pot without great relief.  We will tell her to use Afrin as directed no more than 3 days.  Patient will follow-up if no improvement ambulatory referral placed for ENT today.  Relevant Orders   Ambulatory referral to ENT    No orders of the defined types were placed in this encounter.   Return if symptoms worsen or fail to improve.  Romilda Garret, NP

## 2021-08-12 NOTE — Patient Instructions (Signed)
Nice to see you today Try some over the counter afrin. Do not use it more than 3 days in a row Follow up if symptoms worsen.

## 2021-09-09 ENCOUNTER — Other Ambulatory Visit: Payer: Self-pay

## 2021-09-09 MED ORDER — FLUTICASONE PROPIONATE 50 MCG/ACT NA SUSP
NASAL | 11 refills | Status: DC
Start: 2021-09-09 — End: 2023-07-07
  Filled 2021-09-09 – 2022-01-15 (×2): qty 16, 30d supply, fill #0
  Filled 2022-02-13: qty 16, 30d supply, fill #1
  Filled 2022-03-30: qty 16, 30d supply, fill #2
  Filled 2022-05-17: qty 16, 30d supply, fill #3

## 2021-09-09 MED ORDER — MOMETASONE FUROATE 0.1 % EX SOLN
CUTANEOUS | 4 refills | Status: DC
  Filled 2021-09-09 – 2022-01-15 (×2): qty 60, 30d supply, fill #0
  Filled 2022-02-13: qty 60, 30d supply, fill #1
  Filled 2022-05-17: qty 60, 30d supply, fill #2

## 2021-09-16 ENCOUNTER — Other Ambulatory Visit: Payer: Self-pay

## 2021-09-22 ENCOUNTER — Other Ambulatory Visit: Payer: Self-pay

## 2021-10-09 ENCOUNTER — Other Ambulatory Visit: Payer: Self-pay

## 2021-10-09 MED ORDER — METHYLPREDNISOLONE 4 MG PO TBPK
ORAL_TABLET | ORAL | 0 refills | Status: DC
  Filled 2021-10-09: qty 21, 6d supply, fill #0

## 2021-10-09 MED ORDER — MELOXICAM 15 MG PO TABS
15.0000 mg | ORAL_TABLET | Freq: Every day | ORAL | 0 refills | Status: DC
  Filled 2021-10-09: qty 30, 30d supply, fill #0

## 2021-10-13 ENCOUNTER — Other Ambulatory Visit: Payer: Self-pay

## 2021-10-21 ENCOUNTER — Other Ambulatory Visit: Payer: Self-pay

## 2021-10-21 ENCOUNTER — Ambulatory Visit
Admission: RE | Admit: 2021-10-21 | Discharge: 2021-10-21 | Disposition: A | Discharge status: Home / Self Care | Payer: 59 | Source: Ambulatory Visit | Attending: Emergency Medicine | Admitting: Emergency Medicine

## 2021-10-21 VITALS — BP 104/70 | HR 103 | Temp 98.1°F | Resp 18 | Ht 65.0 in | Wt 230.0 lb

## 2021-10-21 DIAGNOSIS — L02415 Cutaneous abscess of right lower limb: Secondary | ICD-10-CM

## 2021-10-21 DIAGNOSIS — L03115 Cellulitis of right lower limb: Secondary | ICD-10-CM

## 2021-10-21 MED ORDER — DOXYCYCLINE HYCLATE 100 MG PO CAPS
100.0000 mg | ORAL_CAPSULE | Freq: Two times a day (BID) | ORAL | 0 refills | Status: DC
  Filled 2021-10-21: qty 14, 7d supply, fill #0

## 2021-10-21 NOTE — Discharge Instructions (Addendum)
Take the doxycycline as directed.    Keep the area clean and dry.  Wash it gently twice a day with soap and water.  Apply an antibiotic cream twice a day.    Go to the emergency department if you have signs of worsening infection, such as increased redness, red streaks, fever, or other concerning symptoms.    Follow up with your primary care provider on Monday.

## 2021-10-21 NOTE — ED Provider Notes (Signed)
[name]    CSN: [id number] Arrival date & time: 10/21/21  1023      History   Chief Complaint Chief Complaint  Patient presents with   Abscess    Several places on my right upper leg.  Not sure if they are boils but they are very painful and hot to touch. - Entered by patient    HPI Evelyn Shepherd is a 61 y.o. female.  Patient presents with painful red abscess on her right upper thigh.  She had another abscess on her right thigh and one on her right axilla previously but these are healing after she manually drained them.  She denies fever, chills, wound drainage, numbness, weakness, paresthesias, or other symptoms.  Her medical history includes prediabetes and obesity.  The history is provided by the patient and medical records.    Past Medical History:  Diagnosis Date   History of depression    History of gastroesophageal reflux (GERD)    History of hyperlipidemia    History of migraine    History of obesity    lap band surgery   History of pneumonia 2005    Patient Active Problem List   Diagnosis Date Noted   Sinus pressure 08/12/2021   Acute recurrent maxillary sinusitis 08/06/2021   COVID-19 05/18/2021   Urinary incontinence 01/30/2021   Fibromyalgia 01/30/2021   Acute nonintractable headache 01/30/2021   Need for immunization against influenza 11/04/2020   Vitamin D deficiency 05/06/2020   Acute bilateral thoracic back pain 11/06/2019   Meralgia paraesthetica, left 11/06/2019   Hip pain 05/01/2019   Muscle cramping 05/01/2019   Bariatric surgery status 05/01/2019   Urinary frequency 11/23/2018   Screening mammogram, encounter for 11/23/2018   B12 deficiency 11/23/2018   Vertigo 07/17/2018   Former smoker 06/08/2017   History of shingles 01/22/2016   Bursitis of left hip 01/29/2015   Lumbar pain 12/26/2013   Brachioradial pruritus 11/07/2013   Fatigue 11/07/2013   Encounter for routine gynecological examination 04/11/2013   Chest wall  pain 04/11/2013   Plantar fasciitis 04/11/2013   Colon cancer screening 02/13/2013   Screening for breast cancer 04/13/2011   Special screening for malignant neoplasms, colon 04/13/2011   Seborrheic dermatitis 04/13/2011   Hypothyroidism 08/11/2007   LOW BACK PAIN, CHRONIC 07/14/2006   Prediabetes 07/06/2006   Hyperlipidemia 07/06/2006   Depression, recurrent ([place]) 07/06/2006   Migraine without aura 07/06/2006   GERD 07/06/2006    Past Surgical History:  Procedure Laterality Date   AUGMENTATION MAMMAPLASTY Bilateral 1991   BREAST ENHANCEMENT SURGERY     CESAREAN [place] GASTRIC BANDING  07/31/07   LUMBAR [place] SURGERY     TONGUE SURGERY  03/2005   lesion removal    OB History   No obstetric history on file.      Home Medications    Prior to Admission medications   Medication Sig Start Date End Date Taking? Authorizing Provider  doxycycline (VIBRAMYCIN) 100 MG capsule Take 1 capsule (100 mg total) by mouth 2 (two) times daily for 7 days. 10/21/21 10/28/21 Yes Sharion Balloon, NP  albuterol (VENTOLIN HFA) 108 (90 Base) MCG/ACT inhaler Inhale 1-2 puffs into the lungs every 6 (six) hours as needed (cough). 09/14/20   Boddu, Erasmo Downer, FNP  atorvastatin (LIPITOR) 20 MG tablet Take 1 tablet (20 mg total) by mouth daily. 06/16/21   Tower, Wynelle Fanny, MD  escitalopram (LEXAPRO) 20 MG tablet TAKE  1 TABLET BY MOUTH DAILY. 02/19/21   Tower, Wynelle Fanny, MD  fluticasone [facility]) 50 MCG/ACT nasal spray Place 2 sprays into both nostrils daily as needed for allergies or rhinitis. 07/15/21   Tower, Wynelle Fanny, MD  fluticasone [facility]) 50 MCG/ACT nasal spray Spray two sprays in each nostril once daily 09/09/21     gabapentin (NEURONTIN) 100 MG capsule Take 1 capsule (100 mg total) by mouth 2 (two) times daily 01/23/21     ibuprofen (ADVIL) 800 MG tablet Take 1 tablet (800 mg total) by mouth every 8 (eight) hours as needed (with food). 07/15/21   Tower, Wynelle Fanny, MD  ketoconazole  (NIZORAL) 2 % cream Apply topically daily. 07/15/21   Tower, Wynelle Fanny, MD  Magnesium Citrate 125 MG CAPS Take by mouth in the morning and at bedtime.    [provider]  meclizine (ANTIVERT) 25 MG tablet Take 1 tablet (25 mg total) by mouth as needed for dizziness. 07/15/21   Tower, Wynelle Fanny, MD  meloxicam (MOBIC) 15 MG tablet Take 1 tablet (15 mg total) by mouth daily. 10/09/21     methylPREDNISolone (MEDROL) 4 MG TBPK tablet Take 1 dose pk by oral route. 10/09/21     mometasone (ELOCON) 0.1 % lotion Apply to itchy ears twice daily for 12 days.  May repeat as needed for itchy ears 09/09/21     SUMAtriptan (IMITREX) 100 MG tablet TAKE 1 TABLET BY MOUTH FOR HEADACHE. MAY REPEAT IN 2 HOURS IF HEADACHE PERSISTS OR RECURS. 02/19/21   Tower, Wynelle Fanny, MD  Vitamin D, Ergocalciferol, (DRISDOL) 1.25 MG (50000 UNIT) CAPS capsule Take 1 capsule (50,000 Units total) by mouth every 7 (seven) days. 07/15/21   Tower, Wynelle Fanny, MD    Family History Family History  Problem Relation Age of Onset   Kidney cancer Mother    Diabetes Mother    Heart attack Mother    Pneumonia Father        died   Lung cancer Father    Depression Brother        commited suicide   Alcohol abuse Brother    Breast cancer Paternal Aunt    Breast cancer Paternal Aunt    Early menopause Other        family   Colon cancer Neg Hx    Pancreatic cancer Neg Hx    Rectal cancer Neg Hx    Stomach cancer Neg Hx     Social History Social History   Tobacco Use   Smoking status: Some Days    Packs/day: 1.50    Years: 12.00    Total pack years: 18.00    Types: Cigarettes    Last attempt to quit: 09/25/2017    Years since quitting: 4.0   Smokeless tobacco: Never   Tobacco comments:    Smokes 1 cigarette daily   Vaping Use   Vaping Use: Never used  Substance Use Topics   Alcohol use: Yes    Alcohol/week: 0.0 standard drinks of alcohol    Comment: occasional on monthly basis   Drug use: No     Allergies   Meloxicam and  Sulfonamide derivatives   Review of Systems Review of Systems  Constitutional:  Negative for chills and fever.  Musculoskeletal:  Negative for arthralgias and joint swelling.  Skin:  Positive for color change and wound.  Neurological:  Negative for weakness and numbness.  All other systems reviewed and are negative.    Physical Exam Triage Vital Signs ED  Triage Vitals [10/21/21 1040]  Enc Vitals Group     BP 104/70     Pulse Rate (!) 103     Resp 18     Temp 98.1 F (36.7 C)     Temp src      SpO2 95 %     Weight      Height      Head Circumference      Peak Flow      Pain Score      Pain Loc      Pain Edu?      Excl. in [place]?    No data found.  Updated Vital Signs BP 104/70   Pulse (!) 103   Temp 98.1 F (36.7 C)   Resp 18   Ht '5\' 5"'$  (1.651 m)   Wt 230 lb (104.3 kg)   SpO2 95%   BMI 38.27 kg/m   Visual Acuity Right Eye Distance:   Left Eye Distance:   Bilateral Distance:    Right Eye Near:   Left Eye Near:    Bilateral Near:     Physical Exam Vitals and nursing note reviewed.  Constitutional:      General: She is not in acute distress.    Appearance: She is well-developed. She is obese. She is not ill-appearing.  HENT:     Mouth/Throat:     Mouth: Mucous membranes are moist.  Cardiovascular:     Rate and Rhythm: Normal rate and regular rhythm.  Pulmonary:     Effort: Pulmonary effort is normal. No respiratory distress.  Musculoskeletal:        General: Normal range of motion.     Cervical back: Neck supple.  Skin:    General: Skin is warm and dry.     Capillary Refill: Capillary refill takes less than 2 seconds.     Findings: Erythema and lesion present.     Comments: Large area of induration and erythema on right upper thigh with central scabbed lesion.  See pictures.  Needle aspiration with return of scant blood; no purulent drainage.  Neurological:     General: No focal deficit present.     Mental Status: She is alert and oriented to  person, place, and time.     Sensory: No sensory deficit.     Motor: No weakness.     Gait: Gait normal.  Psychiatric:        Mood and Affect: Mood normal.        Behavior: Behavior normal.         UC Treatments / Results  Labs (all labs ordered are listed, but only abnormal results are displayed) Labs Reviewed - No data to display  EKG   Radiology No results found.  Procedures Procedures (including critical care time)  Medications Ordered in UC Medications - No data to display  Initial Impression / Assessment and Plan / UC Course  I have reviewed the triage vital signs and the nursing notes.  Pertinent labs & imaging results that were available during my care of the patient were reviewed by me and considered in my medical decision making (see chart for details).   Abscess and cellulitis of right thigh.  Treating with doxycycline.  Needle aspiration of the area with return of scant blood; no purulent drainage.  Wound care instructions and signs of worsening infection discussed with patient at length.  Education provided on abscess and cellulitis.  ED precautions discussed.  Instructed patient to follow-up with  her PCP on Monday.  Instructed her to follow-up right away if she notes signs of worsening infection.  She agrees to plan of care.   Final Clinical Impressions(s) / UC Diagnoses   Final diagnoses:  Abscess of right thigh  Cellulitis of right thigh     Discharge Instructions      Take the doxycycline as directed.    Keep the area clean and dry.  Wash it gently twice a day with soap and water.  Apply an antibiotic cream twice a day.    Go to the emergency department if you have signs of worsening infection, such as increased redness, red streaks, fever, or other concerning symptoms.    Follow up with your primary care provider on Monday.         ED Prescriptions     Medication Sig Dispense Auth. Provider   doxycycline (VIBRAMYCIN) 100 MG capsule  Take 1 capsule (100 mg total) by mouth 2 (two) times daily for 7 days. 14 capsule Sharion Balloon, NP      PDMP not reviewed this encounter.   Sharion Balloon, NP 10/21/21 1138

## 2021-10-21 NOTE — ED Triage Notes (Signed)
Patient to Urgent Care with complaints of potential abscesses on the inside of her right groin. Reports she also has one present to her right armpit. Reports they appeared on Thursday/ Friday. Reports 4-5 total. One has a yellow head but none have drained. Reports they are painful and hot to the touch.   Denies hx of the same.

## 2021-10-27 ENCOUNTER — Encounter: Payer: Self-pay | Admitting: Family Medicine

## 2021-10-27 ENCOUNTER — Other Ambulatory Visit: Payer: Self-pay

## 2021-10-27 ENCOUNTER — Ambulatory Visit: Payer: 59 | Admitting: Family Medicine

## 2021-10-27 VITALS — BP 116/62 | HR 83 | Temp 97.6°F | Ht 65.0 in | Wt 229.1 lb

## 2021-10-27 DIAGNOSIS — E7800 Pure hypercholesterolemia, unspecified: Secondary | ICD-10-CM | POA: Diagnosis not present

## 2021-10-27 DIAGNOSIS — E538 Deficiency of other specified B group vitamins: Secondary | ICD-10-CM

## 2021-10-27 DIAGNOSIS — R7303 Prediabetes: Secondary | ICD-10-CM | POA: Diagnosis not present

## 2021-10-27 DIAGNOSIS — L02415 Cutaneous abscess of right lower limb: Secondary | ICD-10-CM | POA: Diagnosis not present

## 2021-10-27 DIAGNOSIS — E559 Vitamin D deficiency, unspecified: Secondary | ICD-10-CM | POA: Diagnosis not present

## 2021-10-27 LAB — COMPREHENSIVE METABOLIC PANEL
ALT: 17 U/L (ref 0–35)
AST: 12 U/L (ref 0–37)
Albumin: 3.4 g/dL — ABNORMAL LOW (ref 3.5–5.2)
Alkaline Phosphatase: 54 U/L (ref 39–117)
BUN: 19 mg/dL (ref 6–23)
CO2: 31 mEq/L (ref 19–32)
Calcium: 8.6 mg/dL (ref 8.4–10.5)
Chloride: 103 mEq/L (ref 96–112)
Creatinine, Ser: 0.75 mg/dL (ref 0.40–1.20)
GFR: 86.11 mL/min (ref >60.00)
Glucose, Bld: 125 mg/dL — ABNORMAL HIGH (ref 70–99)
Potassium: 4.2 mEq/L (ref 3.5–5.1)
Sodium: 138 mEq/L (ref 135–145)
Total Bilirubin: 0.4 mg/dL (ref 0.2–1.2)
Total Protein: 5.3 g/dL — ABNORMAL LOW (ref 6.0–8.3)

## 2021-10-27 LAB — LIPID PANEL
Cholesterol: 207 mg/dL — ABNORMAL HIGH (ref 0–200)
HDL: 50.3 mg/dL (ref >39.00)
LDL Cholesterol: 128 mg/dL — ABNORMAL HIGH (ref 0–99)
NonHDL: 157.13
Total CHOL/HDL Ratio: 4
Triglycerides: 147 mg/dL (ref 0.0–149.0)
VLDL: 29.4 mg/dL (ref 0.0–40.0)

## 2021-10-27 LAB — VITAMIN B12: Vitamin B-12: 271 pg/mL (ref 211–911)

## 2021-10-27 LAB — HEMOGLOBIN A1C: Hgb A1c MFr Bld: 7.4 % — ABNORMAL HIGH (ref 4.6–6.5)

## 2021-10-27 LAB — VITAMIN D 25 HYDROXY (VIT D DEFICIENCY, FRACTURES): VITD: 15.23 ng/mL — ABNORMAL LOW (ref 30.00–100.00)

## 2021-10-27 MED ORDER — DOXYCYCLINE HYCLATE 100 MG PO CAPS
100.0000 mg | ORAL_CAPSULE | Freq: Two times a day (BID) | ORAL | 0 refills | Status: AC
  Filled 2021-10-27: qty 14, 7d supply, fill #0

## 2021-10-27 MED ORDER — CYANOCOBALAMIN 1000 MCG/ML IJ SOLN
1000.0000 ug | Freq: Once | INTRAMUSCULAR | Status: AC
  Administered 2021-10-27: 1000 ug via INTRAMUSCULAR

## 2021-10-27 NOTE — Progress Notes (Signed)
Subjective:    Patient ID: Evelyn Shepherd, female    DOB: 14-Nov-1960, 61 y.o.   MRN: [id number]  HPI Pt presents for f/u of skin infection of R thigh  Is also interested in labs for chronic medical problems  Wt Readings from Last 3 Encounters:  10/27/21 229 lb 2 oz (103.9 kg)  10/21/21 230 lb (104.3 kg)  08/12/21 231 lb 4 oz (104.9 kg)   38.13 kg/m  [facility]  Has PT scheduled    She was seen in UC on 10/11 Tx with doxycycline  Needle asp attempted- returned with scant blood   Getting better  Still has 4-5 hard places   Did warm compress  She did have some drainage  Now not very sore unless she presses on them   Never had anything like this before No h/o mrsa   Has some red area under R arm     Lab Results  Component Value Date   WBC 7.7 12/16/2020   HGB 15.7 (H) 12/16/2020   HCT 46.2 (H) 12/16/2020   MCV 87.3 12/16/2020   PLT 191 12/16/2020   Lab Results  Component Value Date   TSH 3.47 05/06/2020   Lab Results  Component Value Date   CHOL 176 07/24/2020   HDL 49.90 07/24/2020   LDLCALC 101 (H) 07/24/2020   LDLDIRECT 187.8 10/22/2011   TRIG 128.0 07/24/2020   CHOLHDL 4 07/24/2020   Takes atorvastatin 20 mg daily  Lab Results  Component Value Date   HGBA1C 6.2 05/06/2020    She was on a medrol dose pack from [facility]  Glucose did go up  Diet is overall good / tries to eat healthy  Stays the same weight  Not exercising   Not getting her B12 shots  Not taking her D  Patient Active Problem List   Diagnosis Date Noted   Abscess of right thigh 10/27/2021   Sinus pressure 08/12/2021   Acute recurrent maxillary sinusitis 08/06/2021   COVID-19 05/18/2021   Urinary incontinence 01/30/2021   Fibromyalgia 01/30/2021   Acute nonintractable headache 01/30/2021   Need for immunization against influenza 11/04/2020   Vitamin D deficiency 05/06/2020   Acute bilateral thoracic back pain 11/06/2019   Meralgia  paraesthetica, left 11/06/2019   Hip pain 05/01/2019   Muscle cramping 05/01/2019   Bariatric surgery status 05/01/2019   Urinary frequency 11/23/2018   Screening mammogram, encounter for 11/23/2018   B12 deficiency 11/23/2018   Vertigo 07/17/2018   Former smoker 06/08/2017   History of shingles 01/22/2016   Bursitis of left hip 01/29/2015   Lumbar pain 12/26/2013   Brachioradial pruritus 11/07/2013   Fatigue 11/07/2013   Encounter for routine gynecological examination 04/11/2013   Chest wall pain 04/11/2013   Plantar fasciitis 04/11/2013   Colon cancer screening 02/13/2013   Screening for breast cancer 04/13/2011   Special screening for malignant neoplasms, colon 04/13/2011   Seborrheic dermatitis 04/13/2011   Hypothyroidism 08/11/2007   LOW BACK PAIN, CHRONIC 07/14/2006   Prediabetes 07/06/2006   Hyperlipidemia 07/06/2006   Depression, recurrent ([place]) 07/06/2006   Migraine without aura 07/06/2006   GERD 07/06/2006   Past Medical History:  Diagnosis Date   History of depression    History of gastroesophageal reflux (GERD)    History of hyperlipidemia    History of migraine    History of obesity    lap band surgery   History of pneumonia 2005   Past Surgical History:  Procedure Laterality Date   AUGMENTATION MAMMAPLASTY Bilateral 1991   BREAST ENHANCEMENT SURGERY     CESAREAN SECTION     DENTAL SURGERY     LAPAROSCOPIC GASTRIC BANDING  07/31/07   LUMBAR [place] SURGERY     TONGUE SURGERY  03/2005   lesion removal   Social History   Tobacco Use   Smoking status: Some Days    Packs/day: 1.50    Years: 12.00    Total pack years: 18.00    Types: Cigarettes    Last attempt to quit: 09/25/2017    Years since quitting: 4.0   Smokeless tobacco: Never   Tobacco comments:    Smokes 1 cigarette daily   Vaping Use   Vaping Use: Never used  Substance Use Topics   Alcohol use: Yes    Alcohol/week: 0.0 standard drinks of alcohol    Comment: occasional on monthly basis    Drug use: No   Family History  Problem Relation Age of Onset   Kidney cancer Mother    Diabetes Mother    Heart attack Mother    Pneumonia Father        died   Lung cancer Father    Depression Brother        commited suicide   Alcohol abuse Brother    Breast cancer Paternal Aunt    Breast cancer Paternal Aunt    Early menopause Other        family   Colon cancer Neg Hx    Pancreatic cancer Neg Hx    Rectal cancer Neg Hx    Stomach cancer Neg Hx    Allergies  Allergen Reactions   Meloxicam     REACTION: unspecified   Sulfonamide Derivatives     REACTION: unspecified   Current Outpatient Medications on File Prior to Visit  Medication Sig Dispense Refill   albuterol (VENTOLIN HFA) 108 (90 Base) MCG/ACT inhaler Inhale 1-2 puffs into the lungs every 6 (six) hours as needed (cough). 6.7 g 0   atorvastatin (LIPITOR) 20 MG tablet Take 1 tablet (20 mg total) by mouth daily. 90 tablet 0   escitalopram (LEXAPRO) 20 MG tablet TAKE 1 TABLET BY MOUTH DAILY. 90 tablet 1   fluticasone (FLONASE) 50 MCG/ACT nasal spray Spray two sprays in each nostril once daily 16 g 11   ibuprofen (ADVIL) 800 MG tablet Take 1 tablet (800 mg total) by mouth every 8 (eight) hours as needed (with food). 30 tablet 3   ketoconazole (NIZORAL) 2 % cream Apply topically daily. 60 g 0   Magnesium Citrate 125 MG CAPS Take by mouth in the morning and at bedtime.     meclizine (ANTIVERT) 25 MG tablet Take 1 tablet (25 mg total) by mouth as needed for dizziness. 30 tablet 0   meloxicam (MOBIC) 15 MG tablet Take 1 tablet (15 mg total) by mouth daily. 30 tablet 0   mometasone (ELOCON) 0.1 % lotion Apply to itchy ears twice daily for 12 days.  May repeat as needed for itchy ears 60 mL 4   SUMAtriptan (IMITREX) 100 MG tablet TAKE 1 TABLET BY MOUTH FOR HEADACHE. MAY REPEAT IN 2 HOURS IF HEADACHE PERSISTS OR RECURS. 27 tablet 5   Vitamin D, Ergocalciferol, (DRISDOL) 1.25 MG (50000 UNIT) CAPS capsule Take 1 capsule (50,000  Units total) by mouth every 7 (seven) days. 12 capsule 0   No current facility-administered medications on file prior to visit.    Review of Systems  Constitutional:  Positive for fatigue. Negative for activity change, appetite change, fever and unexpected weight change.  HENT:  Negative for congestion, ear pain, rhinorrhea, sinus pressure and sore throat.   Eyes:  Negative for pain, redness and visual disturbance.  Respiratory:  Negative for cough, shortness of breath and wheezing.   Cardiovascular:  Negative for chest pain and palpitations.  Gastrointestinal:  Negative for abdominal pain, blood in stool, constipation and diarrhea.  Endocrine: Negative for polydipsia and polyuria.  Genitourinary:  Negative for dysuria, frequency and urgency.  Musculoskeletal:  Negative for arthralgias, back pain and myalgias.  Skin:  Positive for color change. Negative for pallor and rash.  Allergic/Immunologic: Negative for environmental allergies.  Neurological:  Negative for dizziness, syncope and headaches.  Hematological:  Negative for adenopathy. Does not bruise/bleed easily.  Psychiatric/Behavioral:  Negative for decreased concentration and dysphoric mood. The patient is not nervous/anxious.        Objective:   Physical Exam Constitutional:      General: She is not in acute distress.    Appearance: Normal appearance. She is well-developed. She is obese. She is not ill-appearing or diaphoretic.  HENT:     Head: Normocephalic and atraumatic.     Mouth/Throat:     Mouth: Mucous membranes are moist.  Eyes:     Conjunctiva/sclera: Conjunctivae normal.     Pupils: Pupils are equal, round, and reactive to light.  Neck:     Thyroid: No thyromegaly.     Vascular: No carotid bruit or JVD.  Cardiovascular:     Rate and Rhythm: Normal rate and regular rhythm.     Heart sounds: Normal heart sounds.     No gallop.  Pulmonary:     Effort: Pulmonary effort is normal. No respiratory distress.      Breath sounds: Normal breath sounds. No wheezing or rales.  Abdominal:     General: There is no distension or abdominal bruit.     Palpations: Abdomen is soft.  Musculoskeletal:     Cervical back: Normal range of motion and neck supple.     Right lower leg: No edema.     Left lower leg: No edema.  Lymphadenopathy:     Cervical: No cervical adenopathy.  Skin:    General: Skin is warm and dry.     Coloration: Skin is not pale.     Findings: No rash.     Comments: R upper thigh- much improvement in erythema and induration when compared to UC picture Scab remains/no drainage Not fluctuant  3-4 cm of induration  Minimally tender   1-2 cm area of induration in R axilla -not draining   Neurological:     Mental Status: She is alert.     Cranial Nerves: No cranial nerve deficit.     Coordination: Coordination normal.     Deep Tendon Reflexes: Reflexes are normal and symmetric. Reflexes normal.  Psychiatric:        Mood and Affect: Mood normal.            Assessment & Plan:   Problem List Items Addressed This Visit       Other   Abscess of right thigh - Primary    Abscess/cellulitis treated on 10/11 at [facility] with doxycycline Reviewed note and pictures with pt  Improved now /not entirely resolved  Also has small area in R axilla Disc poss of mrsa (no wound culture) No drainage, some mild induration/erythema  Picture taken Will continue doxycycline for 7 more days  inst to update if no further improvement or if worse inst to stop shaving legs and under arm areas       B12 deficiency    Has not been getting her shots No oral supplementation  H/o bariatric surgery Fatigue noted Level today-expect low   B12 shot today      Relevant Orders   Vitamin B12   Hyperlipidemia    Lipid panel today  Disc goals for lipids and reasons to control them Rev last labs with pt Rev low sat fat diet in detail  Taking atorvastatin 20 mg daily  Diet is fair       Relevant Orders    Comprehensive metabolic panel   Lipid panel   Prediabetes    A1C ordered This may be up due to recent medrol dose pack from ortho for back pain  disc imp of low glycemic diet and wt loss to prevent DM2       Relevant Orders   Hemoglobin A1c   Vitamin D deficiency    Not taking  D level today  Disc imp to bone and overall health        Relevant Orders   VITAMIN D 25 Hydroxy (Vit-D Deficiency, Fractures)

## 2021-10-27 NOTE — Assessment & Plan Note (Signed)
Not taking  D level today  Disc imp to bone and overall health

## 2021-10-27 NOTE — Assessment & Plan Note (Signed)
Lipid panel today  Disc goals for lipids and reasons to control them Rev last labs with pt Rev low sat fat diet in detail  Taking atorvastatin 20 mg daily  Diet is fair

## 2021-10-27 NOTE — Assessment & Plan Note (Signed)
Abscess/cellulitis treated on 10/11 at [facility] with doxycycline Reviewed note and pictures with pt  Improved now /not entirely resolved  Also has small area in R axilla Disc poss of mrsa (no wound culture) No drainage, some mild induration/erythema  Picture taken Will continue doxycycline for 7 more days  inst to update if no further improvement or if worse inst to stop shaving legs and under arm areas

## 2021-10-27 NOTE — Patient Instructions (Addendum)
Take the doxycycline for another week for the skin infection  If thinks do not continue to improve let me know Please don't shave for now   Keep areas really clean with soap and water   Lab today  Your sugar/a1c will be high due to recent steroids   Please try to eat a healthy low glycemic diet  Also find some exercise you don't hate (especially strength training)   B12 shot today   Follow up in 3 months

## 2021-10-27 NOTE — Assessment & Plan Note (Signed)
A1C ordered This may be up due to recent medrol dose pack from ortho for back pain  disc imp of low glycemic diet and wt loss to prevent DM2

## 2021-10-27 NOTE — Assessment & Plan Note (Addendum)
Has not been getting her shots No oral supplementation  H/o bariatric surgery Fatigue noted Level today-expect low   B12 shot today

## 2021-10-28 ENCOUNTER — Other Ambulatory Visit: Payer: Self-pay

## 2021-10-28 MED ORDER — CELECOXIB 100 MG PO CAPS
ORAL_CAPSULE | ORAL | 0 refills | Status: DC
  Filled 2021-10-28: qty 60, 30d supply, fill #0

## 2021-10-28 MED ORDER — TIZANIDINE HCL 4 MG PO TABS
4.0000 mg | ORAL_TABLET | Freq: Three times a day (TID) | ORAL | 0 refills | Status: DC | PRN
  Filled 2021-10-28: qty 45, 15d supply, fill #0

## 2021-10-31 ENCOUNTER — Encounter: Payer: Self-pay | Admitting: Family Medicine

## 2021-12-08 ENCOUNTER — Ambulatory Visit (INDEPENDENT_AMBULATORY_CARE_PROVIDER_SITE_OTHER): Payer: 59

## 2021-12-08 DIAGNOSIS — E538 Deficiency of other specified B group vitamins: Secondary | ICD-10-CM

## 2021-12-08 MED ORDER — CYANOCOBALAMIN 1000 MCG/ML IJ SOLN
1000.0000 ug | Freq: Once | INTRAMUSCULAR | Status: AC
  Administered 2021-12-08: 1000 ug via INTRAMUSCULAR

## 2021-12-08 NOTE — Progress Notes (Signed)
Per orders of Dr. Glori Bickers, injection of monthly B12 1000 mcg/ml given by , CMA in Right Deltoid. Patient tolerated injection well.

## 2021-12-18 ENCOUNTER — Other Ambulatory Visit: Payer: Self-pay

## 2021-12-18 MED ORDER — TIZANIDINE HCL 2 MG PO TABS
2.0000 mg | ORAL_TABLET | Freq: Three times a day (TID) | ORAL | 0 refills | Status: DC | PRN
  Filled 2021-12-18: qty 45, 15d supply, fill #0

## 2021-12-18 MED ORDER — CELECOXIB 100 MG PO CAPS
100.0000 mg | ORAL_CAPSULE | Freq: Every day | ORAL | 0 refills | Status: DC
  Filled 2021-12-18: qty 30, 30d supply, fill #0

## 2021-12-21 ENCOUNTER — Other Ambulatory Visit: Payer: Self-pay

## 2021-12-21 ENCOUNTER — Ambulatory Visit
Admission: EM | Admit: 2021-12-21 | Discharge: 2021-12-21 | Disposition: A | Discharge status: Home / Self Care | Payer: 59 | Attending: Emergency Medicine | Admitting: Emergency Medicine

## 2021-12-21 DIAGNOSIS — L089 Local infection of the skin and subcutaneous tissue, unspecified: Secondary | ICD-10-CM

## 2021-12-21 DIAGNOSIS — L03114 Cellulitis of left upper limb: Secondary | ICD-10-CM | POA: Diagnosis not present

## 2021-12-21 MED ORDER — DOXYCYCLINE HYCLATE 100 MG PO CAPS: 100.0000 mg | ORAL_CAPSULE | Freq: Two times a day (BID) | ORAL | 0 refills | Status: DC

## 2021-12-21 MED FILL — Atorvastatin Calcium Tab 20 MG (Base Equivalent): ORAL | 30 days supply | Qty: 30 | Fill #2 | Status: AC

## 2021-12-21 NOTE — ED Triage Notes (Signed)
Patient to Urgent Care with complaints of insect bite present to left hand that occurred on friday. Large red bite present with redness/ warmness extending to wrist. Denies any fevers. Reports some clear drainage yesterday and today.   Yesterday hand was so swollen she couldn't see her knuckle.  Has been applying neosporin and a bandaid.

## 2021-12-21 NOTE — ED Provider Notes (Signed)
[name]    CSN: [id number] Arrival date & time: 12/21/21  1729      History   Chief Complaint Chief Complaint  Patient presents with   Insect Bite    HPI Evelyn Shepherd is a 61 y.o. female.  Patient presents with 3-day history of redness, pain, swelling with possible insect bite on her left hand.  The wound had some clear drainage but none currently.  No fever, chills, numbness, weakness, or other symptoms.  Treatment at home with topical antibiotic ointment.  The history is provided by the patient and medical records.    Past Medical History:  Diagnosis Date   History of depression    History of gastroesophageal reflux (GERD)    History of hyperlipidemia    History of migraine    History of obesity    lap band surgery   History of pneumonia 2005    Patient Active Problem List   Diagnosis Date Noted   Abscess of right thigh 10/27/2021   Sinus pressure 08/12/2021   Acute recurrent maxillary sinusitis 08/06/2021   COVID-19 05/18/2021   Urinary incontinence 01/30/2021   Fibromyalgia 01/30/2021   Acute nonintractable headache 01/30/2021   Need for immunization against influenza 11/04/2020   Vitamin D deficiency 05/06/2020   Acute bilateral thoracic back pain 11/06/2019   Meralgia paraesthetica, left 11/06/2019   Hip pain 05/01/2019   Muscle cramping 05/01/2019   Bariatric surgery status 05/01/2019   Urinary frequency 11/23/2018   Screening mammogram, encounter for 11/23/2018   B12 deficiency 11/23/2018   Vertigo 07/17/2018   Former smoker 06/08/2017   History of shingles 01/22/2016   Bursitis of left hip 01/29/2015   Lumbar pain 12/26/2013   Brachioradial pruritus 11/07/2013   Fatigue 11/07/2013   Encounter for routine gynecological examination 04/11/2013   Chest wall pain 04/11/2013   Plantar fasciitis 04/11/2013   Colon cancer screening 02/13/2013   Screening for breast cancer 04/13/2011   Special screening for malignant neoplasms, colon  04/13/2011   Seborrheic dermatitis 04/13/2011   Hypothyroidism 08/11/2007   LOW BACK PAIN, CHRONIC 07/14/2006   Prediabetes 07/06/2006   Hyperlipidemia 07/06/2006   Depression, recurrent ([place]) 07/06/2006   Migraine without aura 07/06/2006   GERD 07/06/2006    Past Surgical History:  Procedure Laterality Date   AUGMENTATION MAMMAPLASTY Bilateral 1991   BREAST ENHANCEMENT SURGERY     CESAREAN [place] GASTRIC BANDING  07/31/07   LUMBAR [place] SURGERY     TONGUE SURGERY  03/2005   lesion removal    OB History   No obstetric history on file.      Home Medications    Prior to Admission medications   Medication Sig Start Date End Date Taking? Authorizing Provider  doxycycline (VIBRAMYCIN) 100 MG capsule Take 1 capsule (100 mg total) by mouth 2 (two) times daily for 7 days. 12/21/21 12/28/21 Yes Sharion Balloon, NP  albuterol (VENTOLIN HFA) 108 (90 Base) MCG/ACT inhaler Inhale 1-2 puffs into the lungs every 6 (six) hours as needed (cough). 09/14/20   Boddu, Erasmo Downer, FNP  atorvastatin (LIPITOR) 20 MG tablet Take 1 tablet (20 mg total) by mouth daily. 06/16/21   Tower, Wynelle Fanny, MD  celecoxib (CELEBREX) 100 MG capsule Take 1 capsule (100 mg total) by mouth daily. 12/18/21     escitalopram (LEXAPRO) 20 MG tablet TAKE 1 TABLET BY MOUTH DAILY. 02/19/21   Tower, Wynelle Fanny, MD  fluticasone (FLONASE) 50  MCG/ACT nasal spray Spray two sprays in each nostril once daily 09/09/21     ibuprofen (ADVIL) 800 MG tablet Take 1 tablet (800 mg total) by mouth every 8 (eight) hours as needed (with food). 07/15/21   Tower, Wynelle Fanny, MD  ketoconazole (NIZORAL) 2 % cream Apply topically daily. 07/15/21   Tower, Wynelle Fanny, MD  Magnesium Citrate 125 MG CAPS Take by mouth in the morning and at bedtime.    [provider]  meclizine (ANTIVERT) 25 MG tablet Take 1 tablet (25 mg total) by mouth as needed for dizziness. 07/15/21   Tower, Wynelle Fanny, MD  meloxicam (MOBIC) 15 MG tablet Take 1  tablet (15 mg total) by mouth daily. 10/09/21     mometasone (ELOCON) 0.1 % lotion Apply to itchy ears twice daily for 12 days.  May repeat as needed for itchy ears 09/09/21     SUMAtriptan (IMITREX) 100 MG tablet TAKE 1 TABLET BY MOUTH FOR HEADACHE. MAY REPEAT IN 2 HOURS IF HEADACHE PERSISTS OR RECURS. 02/19/21   Tower, Wynelle Fanny, MD  tiZANidine (ZANAFLEX) 2 MG tablet Take 1 tablet (2 mg total) by mouth every 8 (eight) hours 12/18/21     tiZANidine (ZANAFLEX) 4 MG tablet Take 1 tablet (4 mg total) by mouth every 8 (eight) hours as needed for 15 days. 10/28/21     Vitamin D, Ergocalciferol, (DRISDOL) 1.25 MG (50000 UNIT) CAPS capsule Take 1 capsule (50,000 Units total) by mouth every 7 (seven) days. 07/15/21   Tower, Wynelle Fanny, MD    Family History Family History  Problem Relation Age of Onset   Kidney cancer Mother    Diabetes Mother    Heart attack Mother    Pneumonia Father        died   Lung cancer Father    Depression Brother        commited suicide   Alcohol abuse Brother    Breast cancer Paternal Aunt    Breast cancer Paternal Aunt    Early menopause Other        family   Colon cancer Neg Hx    Pancreatic cancer Neg Hx    Rectal cancer Neg Hx    Stomach cancer Neg Hx     Social History Social History   Tobacco Use   Smoking status: Some Days    Packs/day: 1.50    Years: 12.00    Total pack years: 18.00    Types: Cigarettes    Last attempt to quit: 09/25/2017    Years since quitting: 4.2   Smokeless tobacco: Never   Tobacco comments:    Smokes 1 cigarette daily   Vaping Use   Vaping Use: Never used  Substance Use Topics   Alcohol use: Yes    Alcohol/week: 0.0 standard drinks of alcohol    Comment: occasional on monthly basis   Drug use: No     Allergies   Meloxicam and Sulfonamide derivatives   Review of Systems Review of Systems  Constitutional:  Negative for chills and fever.  Skin:  Positive for color change and wound.  Neurological:  Negative for weakness  and numbness.  All other systems reviewed and are negative.    Physical Exam Triage Vital Signs ED Triage Vitals  Enc Vitals Group     BP 12/21/21 1835 116/77     Pulse Rate 12/21/21 1835 78     Resp 12/21/21 1835 18     Temp 12/21/21 1835 98.8 F (37.1 C)  Temp src --      SpO2 12/21/21 1835 94 %     Weight --      Height 12/21/21 1832 '5\' 5"'$  (1.651 m)     Head Circumference --      Peak Flow --      Pain Score 12/21/21 1832 5     Pain Loc --      Pain Edu? --      Excl. in [place]? --    No data found.  Updated Vital Signs BP 116/77   Pulse 78   Temp 98.8 F (37.1 C)   Resp 18   Ht '5\' 5"'$  (1.651 m)   SpO2 94%   BMI 38.13 kg/m   Visual Acuity Right Eye Distance:   Left Eye Distance:   Bilateral Distance:    Right Eye Near:   Left Eye Near:    Bilateral Near:     Physical Exam Vitals and nursing note reviewed.  Constitutional:      General: She is not in acute distress.    Appearance: Normal appearance. She is well-developed. She is not ill-appearing.  HENT:     Mouth/Throat:     Mouth: Mucous membranes are moist.  Cardiovascular:     Rate and Rhythm: Normal rate and regular rhythm.  Pulmonary:     Effort: Pulmonary effort is normal. No respiratory distress.  Abdominal:     Palpations: Abdomen is soft.  Musculoskeletal:        General: Swelling and tenderness present. No deformity. Normal range of motion.     Cervical back: Neck supple.  Skin:    General: Skin is warm and dry.     Capillary Refill: Capillary refill takes less than 2 seconds.     Findings: Erythema and lesion present.     Comments: Lesion on dorsum of left hand with surrounding erythema and edema; redness extends to left wrist with red streak.  See pictures for details.  Neurological:     General: No focal deficit present.     Mental Status: She is alert and oriented to person, place, and time.     Sensory: No sensory deficit.     Motor: No weakness.  Psychiatric:        Mood and  Affect: Mood normal.        Behavior: Behavior normal.         UC Treatments / Results  Labs (all labs ordered are listed, but only abnormal results are displayed) Labs Reviewed - No data to display  EKG   Radiology No results found.  Procedures Procedures (including critical care time)  Medications Ordered in UC Medications - No data to display  Initial Impression / Assessment and Plan / UC Course  I have reviewed the triage vital signs and the nursing notes.  Pertinent labs & imaging results that were available during my care of the patient were reviewed by me and considered in my medical decision making (see chart for details).   Cellulitis due to infected wound on left hand.  Patient has small red streak coming away from the hand.  She declines transfer to the ED.  Afebrile and vital signs are stable.  Treating with doxycycline.  Instructed patient to follow-up with her PCP tomorrow.  Strict ED precautions discussed.  Education provided on cellulitis.  Patient agrees to plan of care.   Final Clinical Impressions(s) / UC Diagnoses   Final diagnoses:  Cellulitis of left hand  Infected wound  Discharge Instructions      Take the doxycycline as directed.  Follow-up with your primary care provider tomorrow.   Go to the emergency department if you have increased redness, fever, or other concerning symptoms.     ED Prescriptions     Medication Sig Dispense Auth. Provider   doxycycline (VIBRAMYCIN) 100 MG capsule Take 1 capsule (100 mg total) by mouth 2 (two) times daily for 7 days. 14 capsule Sharion Balloon, NP      PDMP not reviewed this encounter.   Sharion Balloon, NP 12/21/21 [phone]

## 2021-12-21 NOTE — Discharge Instructions (Addendum)
Take the doxycycline as directed.  Follow-up with your primary care provider tomorrow.   Go to the emergency department if you have increased redness, fever, or other concerning symptoms.

## 2021-12-22 ENCOUNTER — Other Ambulatory Visit: Payer: Self-pay

## 2021-12-23 ENCOUNTER — Emergency Department
Admission: EM | Admit: 2021-12-23 | Discharge: 2021-12-23 | Disposition: A | Discharge status: Home / Self Care | Payer: 59 | Attending: Emergency Medicine | Admitting: Emergency Medicine

## 2021-12-23 ENCOUNTER — Other Ambulatory Visit: Payer: Self-pay

## 2021-12-23 ENCOUNTER — Ambulatory Visit: Payer: 59 | Admitting: Family Medicine

## 2021-12-23 ENCOUNTER — Telehealth: Payer: Self-pay

## 2021-12-23 ENCOUNTER — Encounter: Payer: Self-pay | Admitting: Emergency Medicine

## 2021-12-23 DIAGNOSIS — L02512 Cutaneous abscess of left hand: Secondary | ICD-10-CM | POA: Diagnosis not present

## 2021-12-23 DIAGNOSIS — M79642 Pain in left hand: Secondary | ICD-10-CM | POA: Diagnosis present

## 2021-12-23 DIAGNOSIS — L03114 Cellulitis of left upper limb: Secondary | ICD-10-CM | POA: Diagnosis not present

## 2021-12-23 DIAGNOSIS — L0291 Cutaneous abscess, unspecified: Secondary | ICD-10-CM

## 2021-12-23 DIAGNOSIS — W57XXXA Bitten or stung by nonvenomous insect and other nonvenomous arthropods, initial encounter: Secondary | ICD-10-CM | POA: Insufficient documentation

## 2021-12-23 MED ORDER — CLINDAMYCIN HCL 300 MG PO CAPS
300.0000 mg | ORAL_CAPSULE | Freq: Three times a day (TID) | ORAL | 0 refills | Status: AC
  Filled 2021-12-23: qty 30, 10d supply, fill #0

## 2021-12-23 MED ORDER — KETOROLAC TROMETHAMINE 60 MG/2ML IM SOLN
60.0000 mg | Freq: Once | INTRAMUSCULAR | Status: AC
  Administered 2021-12-23: 60 mg via INTRAMUSCULAR
  Filled 2021-12-23: qty 2

## 2021-12-23 MED ORDER — LIDOCAINE HCL (PF) 1 % IJ SOLN
5.0000 mL | Freq: Once | INTRAMUSCULAR | Status: DC
  Filled 2021-12-23: qty 5

## 2021-12-23 MED ORDER — CLINDAMYCIN HCL 150 MG PO CAPS
300.0000 mg | ORAL_CAPSULE | Freq: Once | ORAL | Status: AC
  Administered 2021-12-23: 300 mg via ORAL
  Filled 2021-12-23: qty 2

## 2021-12-23 NOTE — Telephone Encounter (Signed)
LVM for patient to call back and cancel appointment.

## 2021-12-23 NOTE — ED Provider Notes (Signed)
[facility] Provider Note    Event Date/Time   First MD Initiated Contact with Patient 12/23/21 2019     (approximate)  History   Chief Complaint: Insect Bite  HPI  Evelyn Shepherd is a 61 y.o. female presents to the emergency department for left hand redness pain and swelling.  Patient states starting Thursday or Friday she began having some itching to the dorsal aspect of her left hand.  States she noticed redness and swelling to the area went to urgent care on Monday and was prescribed doxycycline.  Patient states starting doxycycline the redness has not improved much but the bump has become darker and larger so the patient came to the emergency department today for evaluation.  Denies any fever.  Physical Exam   Triage Vital Signs: ED Triage Vitals  Enc Vitals Group     BP 12/23/21 1821 (!) 111/91     Pulse Rate 12/23/21 1821 82     Resp 12/23/21 1821 18     Temp 12/23/21 1821 98.6 F (37 C)     Temp Source 12/23/21 1821 Oral     SpO2 12/23/21 1821 97 %     Weight 12/23/21 1823 229 lb (103.9 kg)     Height 12/23/21 1823 '5\' 5"'$  (1.651 m)     Head Circumference --      Peak Flow --      Pain Score 12/23/21 1822 8     Pain Loc --      Pain Edu? --      Excl. in [place]? --     Most recent vital signs: Vitals:   12/23/21 1821  BP: (!) 111/91  Pulse: 82  Resp: 18  Temp: 98.6 F (37 C)  SpO2: 97%    General: Awake, no distress.  CV:  Good peripheral perfusion.   Resp:  Normal effort.  Abd:  No distention.  Other:  Patient has approximate 2 cm diameter area of induration swelling and redness with some redness extending approximately 6 cm in diameter to the dorsal aspect of the hand.  Patient has marks on her forearm where she had lymphatic streaking and marked to the area of streaking which appears to have resolved somewhat.  Area in the center of the redness is most consistent with an abscess with pointing and fluctuance.   ED Results /  Procedures / Treatments   MEDICATIONS ORDERED IN ED: Medications  ketorolac (TORADOL) injection 60 mg (has no administration in time range)  lidocaine (PF) (XYLOCAINE) 1 % injection 5 mL (has no administration in time range)  clindamycin (CLEOCIN) capsule 300 mg (300 mg Oral Given 12/23/21 2036)     IMPRESSION / MDM / ASSESSMENT AND PLAN / ED COURSE  I reviewed the triage vital signs and the nursing notes.  Patient's presentation is most consistent with acute presentation with potential threat to life or bodily function.  Patient presents emergency department for what appears to be an abscess to the dorsal aspect of her hand with some mild surrounding cellulitis.  Patient has been taking doxycycline for the past 48 hours.  Patient has pointing to this area.  Used a small amount of Xylocaine to numb the area and a small incision drained approximately 3 cc of exudate.  Patient states it feels much better after drainage.  Suspect more likely a staph infection.  We will place the patient on clindamycin instead of doxycycline.  Discussed with the patient to closely monitor the area  for improvement if the redness or swelling worsens or the patient develops a fever she is to return to the emergency department.    INCISION AND DRAINAGE Performed by: Harvest Dark Consent: Verbal consent obtained. Risks and benefits: risks, benefits and alternatives were discussed Type: abscess  Body area: Dorsal aspect of left hand  Anesthesia: local infiltration  Incision was made with a scalpel.  Local anesthetic: lidocaine 1% wo epinephrine  Anesthetic total: 2 ml  Complexity: complex Blunt dissection to break up loculations  Drainage: purulent  Drainage amount: 3 cc   Patient tolerance: Patient tolerated the procedure well with no immediate complications.    FINAL CLINICAL IMPRESSION(S) / ED DIAGNOSES   Abscess Cellulitis  Rx / DC Orders   Discontinue doxycycline Start  clindamycin  Note:  This document was prepared using Dragon voice recognition software and may include unintentional dictation errors.   Harvest Dark, MD 12/23/21 2047

## 2021-12-23 NOTE — ED Triage Notes (Signed)
Patient arrives ambulatory by POV c/o spider bite to left hand that occurred on Friday. Patient went to UC on Monday and started on doxy. Reports swelling has improved but pain has increased.

## 2021-12-23 NOTE — Telephone Encounter (Signed)
[place] Night - Client Nonclinical Telephone Record  [facility] Client [facility] Night - Client Client Site [place] Provider Loura Pardon - MD Contact Type Call Who Is Calling Patient / Member / Family / Caregiver Caller Name [place] Phone Number [phone] Patient Name Evelyn Shepherd Patient DOB 1960/04/08 Call Type Message Only Information Provided Reason for Call Request to [facility] Appointment Initial Comment Caller states she is trying to cancel an appt for tomorrow. Patient request to speak to RN No Additional Comment Office hours provided. Disp. Time Disposition Final User 12/22/2021 11:51:43 PM General Information Provided Yes Cathlean Sauer Call Closed By: Cathlean Sauer Transaction Date/Time: 12/22/2021 11:49:57 PM (ET

## 2021-12-24 ENCOUNTER — Other Ambulatory Visit: Payer: Self-pay

## 2021-12-24 ENCOUNTER — Telehealth: Payer: Self-pay

## 2021-12-24 NOTE — Telephone Encounter (Signed)
Transition Care Management Follow-up Telephone Call Date of discharge and from where: 12/23/2021 [facility]  How have you been since you were released from the hospital? Patient is doing well after being treated at ED  Any questions or concerns? No  Items Reviewed: Did the pt receive and understand the discharge instructions provided? Yes  Medications obtained and verified? Yes  Other?  N/A Any new allergies since your discharge? No  Dietary orders reviewed? Yes Do you have support at home? Yes   Home Care and Equipment/Supplies: Were home health services ordered? not applicable If so, what is the name of the agency?   Has the agency set up a time to come to the patient's home? not applicable Were any new equipment or medical supplies ordered?   What is the name of the medical supply agency?  Were you able to get the supplies/equipment? not applicable Do you have any questions related to the use of the equipment or supplies? No  Functional Questionnaire: (I = Independent and D = Dependent) ADLs: I   Bathing/Dressing- I  Meal Prep- I  Eating- I    Maintaining continence- I  Transferring/Ambulation- I  Managing Meds- I  Follow up appointments reviewed:  PCP Hospital f/u appt confirmed? Yes  Scheduled to see Dr. Glori Bickers on 12/22 @ 8:30. [place] [facility] f/u appt confirmed?  N/A   Are transportation arrangements needed? No  If their condition worsens, is the pt aware to call PCP or go to the Emergency Dept.? Yes Was the patient provided with contact information for the PCP's office or ED? Yes Was to pt encouraged to call back with questions or concerns? Yes

## 2022-01-01 ENCOUNTER — Ambulatory Visit: Payer: 59 | Admitting: Family Medicine

## 2022-01-01 NOTE — Progress Notes (Deleted)
   Subjective:    Patient ID: Evelyn Shepherd, female    DOB: Aug 31, 1960, 61 y.o.   MRN: [id number]  HPI Pt presents for f/u of abscess /inf L hand   Seen on 12/13 in ER Sympt started with itching thurs las week on dorsal L hand  Then redness/swelling , went to UC and was px doxycycline  Became more red and she was seen in ER Found to have abscess with surrounding cellulitis  This was I and D and 3 cc of exudate noted Abx was changed to clindamycin  Ketorolac 60 mg given IM  Review of Systems     Objective:   Physical Exam        Assessment & Plan:

## 2022-01-13 ENCOUNTER — Ambulatory Visit (INDEPENDENT_AMBULATORY_CARE_PROVIDER_SITE_OTHER): Payer: 59

## 2022-01-13 DIAGNOSIS — E538 Deficiency of other specified B group vitamins: Secondary | ICD-10-CM | POA: Diagnosis not present

## 2022-01-13 MED ORDER — CYANOCOBALAMIN 1000 MCG/ML IJ SOLN
1000.0000 ug | Freq: Once | INTRAMUSCULAR | Status: AC
  Administered 2022-01-13: 1000 ug via INTRAMUSCULAR

## 2022-01-13 NOTE — Progress Notes (Signed)
Per orders of Dr. Marne Tower, injection of Vitamin B 12 given in left deltoid given by Takeshia Wenk. Patient tolerated injection well.  

## 2022-01-15 ENCOUNTER — Other Ambulatory Visit: Payer: Self-pay | Admitting: Family Medicine

## 2022-01-15 ENCOUNTER — Other Ambulatory Visit: Payer: Self-pay

## 2022-01-15 MED FILL — Atorvastatin Calcium Tab 20 MG (Base Equivalent): ORAL | 30 days supply | Qty: 30 | Fill #0 | Status: AC

## 2022-01-18 ENCOUNTER — Other Ambulatory Visit: Payer: Self-pay

## 2022-01-19 ENCOUNTER — Other Ambulatory Visit: Payer: Self-pay

## 2022-01-27 ENCOUNTER — Ambulatory Visit (INDEPENDENT_AMBULATORY_CARE_PROVIDER_SITE_OTHER): Payer: 59 | Admitting: Family Medicine

## 2022-01-27 DIAGNOSIS — R7303 Prediabetes: Secondary | ICD-10-CM

## 2022-01-27 DIAGNOSIS — E538 Deficiency of other specified B group vitamins: Secondary | ICD-10-CM

## 2022-01-27 NOTE — Progress Notes (Signed)
   Subjective:    Patient ID: Evelyn Shepherd, female    DOB: 1960/02/22, 62 y.o.   MRN: [id number]  HPI Pt presents for f/u of elevated blood sugar and chronic medical problems       Last visit A1c was up significantly  Lab Results  Component Value Date   HGBA1C 7.4 (H) 10/27/2021   Had been on steroids in the time prior  Inst to work on low glycemic diet   Review of Systems     Objective:   Physical Exam        Assessment & Plan:

## 2022-02-05 ENCOUNTER — Other Ambulatory Visit: Payer: Self-pay

## 2022-02-05 MED ORDER — CELECOXIB 100 MG PO CAPS
100.0000 mg | ORAL_CAPSULE | Freq: Two times a day (BID) | ORAL | 0 refills | Status: DC
  Filled 2022-02-05: qty 60, 30d supply, fill #0

## 2022-02-05 MED ORDER — TIZANIDINE HCL 4 MG PO TABS
4.0000 mg | ORAL_TABLET | Freq: Three times a day (TID) | ORAL | 0 refills | Status: DC | PRN
  Filled 2022-02-05: qty 45, 15d supply, fill #0

## 2022-02-13 ENCOUNTER — Other Ambulatory Visit: Payer: Self-pay | Admitting: Family Medicine

## 2022-02-14 ENCOUNTER — Other Ambulatory Visit: Payer: Self-pay | Admitting: Family Medicine

## 2022-02-14 ENCOUNTER — Other Ambulatory Visit: Payer: Self-pay

## 2022-02-15 ENCOUNTER — Other Ambulatory Visit: Payer: Self-pay

## 2022-02-16 ENCOUNTER — Ambulatory Visit (INDEPENDENT_AMBULATORY_CARE_PROVIDER_SITE_OTHER): Payer: 59

## 2022-02-16 ENCOUNTER — Other Ambulatory Visit: Payer: Self-pay

## 2022-02-16 DIAGNOSIS — E538 Deficiency of other specified B group vitamins: Secondary | ICD-10-CM | POA: Diagnosis not present

## 2022-02-16 MED ORDER — CYANOCOBALAMIN 1000 MCG/ML IJ SOLN
1000.0000 ug | Freq: Once | INTRAMUSCULAR | Status: AC
  Administered 2022-02-16: 1000 ug via INTRAMUSCULAR

## 2022-02-16 NOTE — Progress Notes (Signed)
Per orders of Dr. Loura Pardon, injection of Vitamin B 12 injection in right deltoid given by Ozzie Hoyle. Patient tolerated injection well.

## 2022-03-30 ENCOUNTER — Other Ambulatory Visit: Payer: Self-pay | Admitting: Family Medicine

## 2022-03-30 ENCOUNTER — Other Ambulatory Visit: Payer: Self-pay

## 2022-03-30 MED FILL — Atorvastatin Calcium Tab 20 MG (Base Equivalent): ORAL | 30 days supply | Qty: 30 | Fill #1 | Status: AC

## 2022-03-31 ENCOUNTER — Other Ambulatory Visit: Payer: Self-pay

## 2022-03-31 MED ORDER — VITAMIN D (ERGOCALCIFEROL) 1.25 MG (50000 UNIT) PO CAPS
50000.0000 [IU] | ORAL_CAPSULE | ORAL | 0 refills | Status: DC
  Filled 2022-03-31: qty 4, 28d supply, fill #0
  Filled 2022-05-17: qty 4, 28d supply, fill #1
  Filled 2022-06-14: qty 4, 28d supply, fill #2

## 2022-03-31 MED ORDER — ESCITALOPRAM OXALATE 20 MG PO TABS
20.0000 mg | ORAL_TABLET | Freq: Every day | ORAL | 0 refills | Status: DC
  Filled 2022-03-31: qty 30, 30d supply, fill #0
  Filled 2022-05-17: qty 30, 30d supply, fill #1
  Filled 2022-06-14: qty 30, 30d supply, fill #2

## 2022-03-31 MED ORDER — SUMATRIPTAN SUCCINATE 100 MG PO TABS
100.0000 mg | ORAL_TABLET | Freq: Once | ORAL | 0 refills | Status: DC
  Filled 2022-03-31: qty 9, 30d supply, fill #0
  Filled 2022-05-17: qty 9, 30d supply, fill #1
  Filled 2022-06-14: qty 9, 30d supply, fill #2

## 2022-03-31 NOTE — Telephone Encounter (Signed)
She no showed last visit  Please schedule f/u Then I will refill

## 2022-03-31 NOTE — Telephone Encounter (Signed)
Last OV was 01/27/22,   Lexapro filled on 02/19/21 #90 tabs/ 1 refill Imitrex filled on 02/19/21 #27 tabs/ 5 refills  Vit D filled on 07/15/21 #12 tabs/ 0 refills

## 2022-03-31 NOTE — Telephone Encounter (Signed)
Patient scheduled.

## 2022-04-05 ENCOUNTER — Ambulatory Visit: Payer: 59 | Admitting: Family Medicine

## 2022-04-05 VITALS — BP 88/52 | HR 75 | Temp 97.7°F | Ht 65.0 in | Wt 217.0 lb

## 2022-04-05 DIAGNOSIS — E559 Vitamin D deficiency, unspecified: Secondary | ICD-10-CM

## 2022-04-05 DIAGNOSIS — R7303 Prediabetes: Secondary | ICD-10-CM

## 2022-04-05 DIAGNOSIS — E7800 Pure hypercholesterolemia, unspecified: Secondary | ICD-10-CM

## 2022-04-05 DIAGNOSIS — E538 Deficiency of other specified B group vitamins: Secondary | ICD-10-CM | POA: Diagnosis not present

## 2022-04-05 DIAGNOSIS — E039 Hypothyroidism, unspecified: Secondary | ICD-10-CM | POA: Diagnosis not present

## 2022-04-05 DIAGNOSIS — R5382 Chronic fatigue, unspecified: Secondary | ICD-10-CM | POA: Diagnosis not present

## 2022-04-05 LAB — COMPREHENSIVE METABOLIC PANEL
ALT: 19 U/L (ref 0–35)
AST: 19 U/L (ref 0–37)
Albumin: 3.6 g/dL (ref 3.5–5.2)
Alkaline Phosphatase: 60 U/L (ref 39–117)
BUN: 20 mg/dL (ref 6–23)
CO2: 26 mEq/L (ref 19–32)
Calcium: 8.3 mg/dL — ABNORMAL LOW (ref 8.4–10.5)
Chloride: 107 mEq/L (ref 96–112)
Creatinine, Ser: 0.75 mg/dL (ref 0.40–1.20)
GFR: 85.85 mL/min (ref >60.00)
Glucose, Bld: 103 mg/dL — ABNORMAL HIGH (ref 70–99)
Potassium: 4 mEq/L (ref 3.5–5.1)
Sodium: 139 mEq/L (ref 135–145)
Total Bilirubin: 0.4 mg/dL (ref 0.2–1.2)
Total Protein: 5.3 g/dL — ABNORMAL LOW (ref 6.0–8.3)

## 2022-04-05 LAB — LDL CHOLESTEROL, DIRECT: Direct LDL: 140 mg/dL

## 2022-04-05 LAB — LIPID PANEL
Cholesterol: 218 mg/dL — ABNORMAL HIGH (ref 0–200)
HDL: 51.5 mg/dL (ref >39.00)
NonHDL: 166.63
Total CHOL/HDL Ratio: 4
Triglycerides: 211 mg/dL — ABNORMAL HIGH (ref 0.0–149.0)
VLDL: 42.2 mg/dL — ABNORMAL HIGH (ref 0.0–40.0)

## 2022-04-05 LAB — HEMOGLOBIN A1C: Hgb A1c MFr Bld: 6 % (ref 4.6–6.5)

## 2022-04-05 NOTE — Assessment & Plan Note (Signed)
More than usual  Likely multi factorial   Supplementing B12 and D Thyroid labs today  Mood seems stable  Enc weight bearing exercise

## 2022-04-05 NOTE — Assessment & Plan Note (Addendum)
Last B12 level was 271 Shots monthly Missed one early this mo/ given today More tired than usual  Level with lab today  Will likely have her start some oral B12

## 2022-04-05 NOTE — Assessment & Plan Note (Signed)
D level today  Last time : Last vitamin D Lab Results  Component Value Date   VD25OH 15.23 (L) 10/27/2021    Now on high dose ergocalciferol  Recommended she get 2000 iu D3 otc and take daily also

## 2022-04-05 NOTE — Assessment & Plan Note (Signed)
Last A1c was up to 7.4 This was with steroids in the 3 mo prior  Off steroids now   Eating low glycemic diet Losing weight  Commended  Enc more exercise  Lab today

## 2022-04-05 NOTE — Progress Notes (Signed)
Subjective:    Patient ID: Evelyn Shepherd, female    DOB: 1960-05-27, 62 y.o.   MRN: [id number]  HPI Pr presents for f/u of chronic health problems    Wt Readings from Last 3 Encounters:  04/05/22 217 lb (98.4 kg)  12/23/21 229 lb (103.9 kg)  10/27/21 229 lb 2 oz (103.9 kg)   36.11 kg/m  Feeling tired  Is more awake at night   Is working on weight loss Slow but steady   Too tired to add exercise     Prediabetes Lab Results  Component Value Date   HGBA1C 7.4 (H) 10/27/2021   Had some steroids in the 3 months prior   No ice cream  On wt watchers  If bread she eats a lower carb version   Snacks- popcorn with no butter Vegetables/raw   1/2 cup of mixed nuts daily  Pintos for protein   Hyperlipidemia  Lab Results  Component Value Date   CHOL 207 (H) 10/27/2021   HDL 50.30 10/27/2021   LDLCALC 128 (H) 10/27/2021   LDLDIRECT 187.8 10/22/2011   TRIG 147.0 10/27/2021   CHOLHDL 4 10/27/2021   Taking atorvastatin  Taking it more reliably also   Eating much better  No butter or oil No ice cream   Last vitamin D Lab Results  Component Value Date   VD25OH 15.23 (L) 10/27/2021   Weekly high dose right now  Lab Results  Component Value Date   VITAMINB12 271 10/27/2021  Due for a shot  Keeping up better   Hypothyroidism in the past  Pt has no clinical changes No change in energy level/ hair or skin/ edema and no tremor Lab Results  Component Value Date   TSH 3.47 05/06/2020   No medications Due for labs  Very tired   Patient Active Problem List   Diagnosis Date Noted   Sinus pressure 08/12/2021   Acute recurrent maxillary sinusitis 08/06/2021   Urinary incontinence 01/30/2021   Fibromyalgia 01/30/2021   Need for immunization against influenza 11/04/2020   Vitamin D deficiency 05/06/2020   Hip pain 05/01/2019   Muscle cramping 05/01/2019   Bariatric surgery status 05/01/2019   Urinary frequency 11/23/2018   Screening mammogram, encounter  for 11/23/2018   B12 deficiency 11/23/2018   Vertigo 07/17/2018   Former smoker 06/08/2017   History of shingles 01/22/2016   Lumbar pain 12/26/2013   Fatigue 11/07/2013   Encounter for routine gynecological examination 04/11/2013   Colon cancer screening 02/13/2013   Screening for breast cancer 04/13/2011   Special screening for malignant neoplasms, colon 04/13/2011   Hypothyroidism 08/11/2007   LOW BACK PAIN, CHRONIC 07/14/2006   Prediabetes 07/06/2006   Hyperlipidemia 07/06/2006   Depression, recurrent ([facility]) 07/06/2006   Migraine without aura 07/06/2006   GERD 07/06/2006   Past Medical History:  Diagnosis Date   History of depression    History of gastroesophageal reflux (GERD)    History of hyperlipidemia    History of migraine    History of obesity    lap band surgery   History of pneumonia 2005   Past Surgical History:  Procedure Laterality Date   AUGMENTATION MAMMAPLASTY Bilateral 1991   BREAST ENHANCEMENT SURGERY     CESAREAN SECTION     DENTAL SURGERY     LAPAROSCOPIC GASTRIC BANDING  07/31/07   LUMBAR [place] SURGERY     TONGUE SURGERY  03/2005   lesion removal   Social History   Tobacco Use   Smoking  status: Some Days    Packs/day: 1.50    Years: 12.00    Additional pack years: 0.00    Total pack years: 18.00    Types: Cigarettes    Last attempt to quit: 09/25/2017    Years since quitting: 4.5   Smokeless tobacco: Never   Tobacco comments:    Smokes 1 cigarette daily   Vaping Use   Vaping Use: Never used  Substance Use Topics   Alcohol use: Yes    Alcohol/week: 0.0 standard drinks of alcohol    Comment: occasional on monthly basis   Drug use: No   Family History  Problem Relation Age of Onset   Kidney cancer Mother    Diabetes Mother    Heart attack Mother    Pneumonia Father        died   Lung cancer Father    Depression Brother        commited suicide   Alcohol abuse Brother    Breast cancer Paternal Aunt    Breast cancer Paternal  Aunt    Early menopause Other        family   Colon cancer Neg Hx    Pancreatic cancer Neg Hx    Rectal cancer Neg Hx    Stomach cancer Neg Hx    Allergies  Allergen Reactions   Meloxicam     REACTION: unspecified   Sulfonamide Derivatives     REACTION: unspecified   Current Outpatient Medications on File Prior to Visit  Medication Sig Dispense Refill   albuterol (VENTOLIN HFA) 108 (90 Base) MCG/ACT inhaler Inhale 1-2 puffs into the lungs every 6 (six) hours as needed (cough). 6.7 g 0   atorvastatin (LIPITOR) 20 MG tablet Take 1 tablet (20 mg total) by mouth daily. 90 tablet 0   celecoxib (CELEBREX) 100 MG capsule Take 1 capsule (100 mg total) by mouth 2 (two) times daily with a meal. 60 capsule 0   escitalopram (LEXAPRO) 20 MG tablet Take 1 tablet (20 mg total) by mouth daily. 90 tablet 0   fluticasone (FLONASE) 50 MCG/ACT nasal spray Spray two sprays in each nostril once daily 16 g 11   ibuprofen (ADVIL) 800 MG tablet Take 1 tablet (800 mg total) by mouth every 8 (eight) hours as needed (with food). 30 tablet 3   ketoconazole (NIZORAL) 2 % cream Apply topically daily. 60 g 0   Magnesium Citrate 125 MG CAPS Take by mouth in the morning and at bedtime.     meclizine (ANTIVERT) 25 MG tablet Take 1 tablet (25 mg total) by mouth as needed for dizziness. 30 tablet 0   mometasone (ELOCON) 0.1 % lotion Apply to itchy ears twice daily for 12 days.  May repeat as needed for itchy ears 60 mL 4   tiZANidine (ZANAFLEX) 4 MG tablet Take 1 tablet (4 mg total) by mouth every 8 (eight) hours as needed. 45 tablet 0   Vitamin D, Ergocalciferol, (DRISDOL) 1.25 MG (50000 UNIT) CAPS capsule Take 1 capsule (50,000 Units total) by mouth every 7 (seven) days. 12 capsule 0   meloxicam (MOBIC) 15 MG tablet Take 1 tablet (15 mg total) by mouth daily. (Patient not taking: Reported on 04/05/2022) 30 tablet 0   SUMAtriptan (IMITREX) 100 MG tablet Take 1 tablet (100 mg total) by mouth once for 1 dose for headache.  May repeat in 2 hours if headache persists or recurs. 27 tablet 0   tiZANidine (ZANAFLEX) 2 MG tablet Take 1 tablet (2  mg total) by mouth every 8 (eight) hours (Patient not taking: Reported on 04/05/2022) 45 tablet 0   tiZANidine (ZANAFLEX) 4 MG tablet Take 1 tablet (4 mg total) by mouth every 8 (eight) hours as needed for 15 days. (Patient not taking: Reported on 04/05/2022) 45 tablet 0   No current facility-administered medications on file prior to visit.     Review of Systems  Constitutional:  Positive for activity change and fatigue. Negative for appetite change, fever and unexpected weight change.  HENT:  Negative for congestion, ear pain, rhinorrhea, sinus pressure and sore throat.   Eyes:  Negative for pain, redness and visual disturbance.  Respiratory:  Negative for cough, shortness of breath and wheezing.   Cardiovascular:  Negative for chest pain and palpitations.  Gastrointestinal:  Negative for abdominal pain, blood in stool, constipation and diarrhea.  Endocrine: Negative for polydipsia and polyuria.  Genitourinary:  Negative for dysuria, frequency and urgency.  Musculoskeletal:  Negative for arthralgias, back pain and myalgias.  Skin:  Negative for pallor and rash.  Allergic/Immunologic: Negative for environmental allergies.  Neurological:  Negative for dizziness, syncope and headaches.  Hematological:  Negative for adenopathy. Does not bruise/bleed easily.  Psychiatric/Behavioral:  Negative for decreased concentration and dysphoric mood. The patient is not nervous/anxious.        Objective:   Physical Exam Constitutional:      General: She is not in acute distress.    Appearance: Normal appearance. She is well-developed. She is obese. She is not ill-appearing or diaphoretic.  HENT:     Head: Normocephalic and atraumatic.     Mouth/Throat:     Mouth: Mucous membranes are moist.  Eyes:     General: No scleral icterus.    Conjunctiva/sclera: Conjunctivae normal.      Pupils: Pupils are equal, round, and reactive to light.  Neck:     Thyroid: No thyromegaly.     Vascular: No carotid bruit or JVD.  Cardiovascular:     Rate and Rhythm: Normal rate and regular rhythm.     Heart sounds: Normal heart sounds.     No gallop.  Pulmonary:     Effort: Pulmonary effort is normal. No respiratory distress.     Breath sounds: Normal breath sounds. No wheezing or rales.  Abdominal:     General: There is no distension or abdominal bruit.     Palpations: Abdomen is soft.  Musculoskeletal:     Cervical back: Normal range of motion and neck supple.     Right lower leg: No edema.     Left lower leg: No edema.  Lymphadenopathy:     Cervical: No cervical adenopathy.  Skin:    General: Skin is warm and dry.     Coloration: Skin is not pale.     Findings: No rash.  Neurological:     Mental Status: She is alert.     Sensory: No sensory deficit.     Motor: No weakness or tremor.     Coordination: Coordination normal.     Gait: Gait normal.     Deep Tendon Reflexes: Reflexes are normal and symmetric. Reflexes normal.  Psychiatric:        Mood and Affect: Mood normal.           Assessment & Plan:   Problem List Items Addressed This Visit       Endocrine   Hypothyroidism    TSH and FT4 today  Feels more tired than usual No supplementation currently  Relevant Orders   TSH   T4, free     Other   B12 deficiency    Last B12 level was 271 Shots monthly Missed one early this mo/ given today More tired than usual  Level with lab today  Will likely have her start some oral B12      Relevant Orders   Vitamin B12   Fatigue - Primary    More than usual  Likely multi factorial   Supplementing B12 and D Thyroid labs today  Mood seems stable  Enc weight bearing exercise       Hyperlipidemia   Relevant Orders   Comprehensive metabolic panel   Lipid panel   Prediabetes    Last A1c was up to 7.4 This was with steroids in the 3 mo prior   Off steroids now   Eating low glycemic diet Losing weight  Commended  Enc more exercise  Lab today      Relevant Orders   Hemoglobin A1c   Vitamin D deficiency    D level today  Last time : Last vitamin D Lab Results  Component Value Date   VD25OH 15.23 (L) 10/27/2021   Now on high dose ergocalciferol  Recommended she get 2000 iu D3 otc and take daily also        Relevant Orders   VITAMIN D 25 Hydroxy (Vit-D Deficiency, Fractures)

## 2022-04-05 NOTE — Patient Instructions (Addendum)
Get some over the counter B12  You may need [phone] mcg daily in addition to the shots We will check a level today and give you a shot   Also some vitamin D3 daily  2000 iu daily    We will check that level also     If you can push through the fatigue   Add some strength training to your routine, this is important for bone and brain health and can reduce your risk of falls and help your body use insulin properly and regulate weight  Light weights, exercise bands , and internet videos are a good way to start  Yoga (chair or regular), machines , floor exercises or a gym with machines are also good options     Keep up the good work with diet and weight loss

## 2022-04-05 NOTE — Assessment & Plan Note (Signed)
TSH and FT4 today  Feels more tired than usual No supplementation currently

## 2022-04-06 ENCOUNTER — Encounter: Payer: Self-pay | Admitting: Family Medicine

## 2022-04-06 DIAGNOSIS — E7800 Pure hypercholesterolemia, unspecified: Secondary | ICD-10-CM

## 2022-04-06 LAB — T4, FREE: Free T4: 0.87 ng/dL (ref 0.60–1.60)

## 2022-04-06 LAB — VITAMIN D 25 HYDROXY (VIT D DEFICIENCY, FRACTURES): VITD: 15.63 ng/mL — ABNORMAL LOW (ref 30.00–100.00)

## 2022-04-06 LAB — VITAMIN B12: Vitamin B-12: 263 pg/mL (ref 211–911)

## 2022-04-06 LAB — TSH: TSH: 1.9 u[IU]/mL (ref 0.35–5.50)

## 2022-04-06 MED ORDER — CYANOCOBALAMIN 1000 MCG/ML IJ SOLN: 1000.0000 ug | Freq: Once | INTRAMUSCULAR | Status: DC

## 2022-04-06 MED ORDER — CYANOCOBALAMIN 1000 MCG/ML IJ SOLN
1000.0000 ug | Freq: Once | INTRAMUSCULAR | Status: AC
  Administered 2022-04-05: 1000 ug via INTRAMUSCULAR

## 2022-04-06 NOTE — Addendum Note (Signed)
Addended by: Pat Kocher on: 04/06/2022 07:05 AM   Modules accepted: Orders

## 2022-04-08 ENCOUNTER — Other Ambulatory Visit: Payer: Self-pay

## 2022-04-08 MED ORDER — ROSUVASTATIN CALCIUM 10 MG PO TABS
10.0000 mg | ORAL_TABLET | Freq: Every day | ORAL | 1 refills | Status: DC
  Filled 2022-04-08: qty 30, 30d supply, fill #0
  Filled 2022-05-17: qty 30, 30d supply, fill #1

## 2022-04-14 ENCOUNTER — Other Ambulatory Visit: Payer: Self-pay

## 2022-04-14 MED ORDER — DIAZEPAM 5 MG PO TABS
5.0000 mg | ORAL_TABLET | ORAL | 0 refills | Status: DC
  Filled 2022-04-14: qty 2, 1d supply, fill #0

## 2022-05-17 ENCOUNTER — Other Ambulatory Visit (INDEPENDENT_AMBULATORY_CARE_PROVIDER_SITE_OTHER): Payer: 59

## 2022-05-17 DIAGNOSIS — E7800 Pure hypercholesterolemia, unspecified: Secondary | ICD-10-CM | POA: Diagnosis not present

## 2022-05-17 LAB — LIPID PANEL
Cholesterol: 158 mg/dL (ref 0–200)
HDL: 55.6 mg/dL (ref >39.00)
LDL Cholesterol: 80 mg/dL (ref 0–99)
NonHDL: 102.85
Total CHOL/HDL Ratio: 3
Triglycerides: 115 mg/dL (ref 0.0–149.0)
VLDL: 23 mg/dL (ref 0.0–40.0)

## 2022-05-17 LAB — ALT: ALT: 20 U/L (ref 0–35)

## 2022-05-17 LAB — AST: AST: 17 U/L (ref 0–37)

## 2022-05-18 ENCOUNTER — Other Ambulatory Visit: Payer: Self-pay

## 2022-05-19 ENCOUNTER — Ambulatory Visit (INDEPENDENT_AMBULATORY_CARE_PROVIDER_SITE_OTHER): Payer: 59

## 2022-05-19 DIAGNOSIS — E538 Deficiency of other specified B group vitamins: Secondary | ICD-10-CM | POA: Diagnosis not present

## 2022-05-19 MED ORDER — CYANOCOBALAMIN 1000 MCG/ML IJ SOLN
1000.0000 ug | Freq: Once | INTRAMUSCULAR | Status: AC
Start: 2022-05-19 — End: 2022-05-19
  Administered 2022-05-19: 1000 ug via INTRAMUSCULAR

## 2022-05-19 NOTE — Progress Notes (Signed)
Per orders of Dr. Milinda Antis, injection of monthly B12 1000 mcg/ml in Left Quadricep Lateralis by patient and trained by Eual Fines, CMA. Patient tolerated injection well.  Talked pt through the process of giving an injection. Then let her do it to herself. Pt felt comfortable in giving herself the shot in her legs. She has a new job and it has made it difficult foe her to get to the office for her injections.  She would like medication and syringes/needles to [facility].

## 2022-06-09 ENCOUNTER — Other Ambulatory Visit: Payer: Self-pay

## 2022-06-09 ENCOUNTER — Telehealth: Payer: Self-pay | Admitting: Family Medicine

## 2022-06-09 MED ORDER — CYANOCOBALAMIN 1000 MCG/ML IJ SOLN
1000.0000 ug | INTRAMUSCULAR | 5 refills | Status: DC
  Filled 2022-06-09: qty 1, 30d supply, fill #0
  Filled 2022-07-13: qty 1, 30d supply, fill #1
  Filled 2022-08-13: qty 1, 30d supply, fill #2
  Filled 2022-09-22: qty 1, 30d supply, fill #3
  Filled 2022-10-19: qty 1, 30d supply, fill #4
  Filled 2022-11-22: qty 1, 30d supply, fill #5

## 2022-06-09 NOTE — Telephone Encounter (Signed)
See note on 05/19/22, pt was educated on how to give herself the injections.

## 2022-06-09 NOTE — Telephone Encounter (Signed)
I sent it  Let me know if there are any problems

## 2022-06-09 NOTE — Telephone Encounter (Signed)
Patient contacted the office regarding B12 injections. Says her last B12 was May 8, she was trained on this day to give them to herself at home. Patient requested the supplies for this injection to be sent to [facility]. Patient says she has contacted this pharmacy and was told that they did not have these for her. I checked meds list as well and did not see these on there. Patient wanted an update regarding this and was hoping it could be sent in. Please advise, thank you.

## 2022-06-10 ENCOUNTER — Other Ambulatory Visit: Payer: Self-pay

## 2022-06-10 MED ORDER — "SYRINGE 21G X 1"" 3 ML MISC"
5 refills | Status: DC
  Filled 2022-06-10 – 2023-06-02 (×12): qty 1, fill #0

## 2022-06-10 MED ORDER — DIAZEPAM 5 MG PO TABS
5.0000 mg | ORAL_TABLET | ORAL | 0 refills | Status: DC | PRN
  Filled 2022-06-10: qty 2, 1d supply, fill #0

## 2022-06-10 NOTE — Addendum Note (Signed)
Addended by: Shon Millet on: 06/10/2022 08:45 AM   Modules accepted: Orders

## 2022-06-14 ENCOUNTER — Other Ambulatory Visit: Payer: Self-pay | Admitting: Family Medicine

## 2022-06-14 ENCOUNTER — Other Ambulatory Visit: Payer: Self-pay

## 2022-06-15 ENCOUNTER — Other Ambulatory Visit: Payer: Self-pay

## 2022-06-15 ENCOUNTER — Other Ambulatory Visit: Payer: Self-pay | Admitting: Family Medicine

## 2022-06-15 MED ORDER — MAGNESIUM CITRATE 125 MG PO CAPS
1.0000 | ORAL_CAPSULE | Freq: Two times a day (BID) | ORAL | 3 refills | Status: DC
  Filled 2022-06-15 – 2023-06-02 (×5): qty 180, fill #0

## 2022-06-15 MED ORDER — DIAZEPAM 5 MG PO TABS
5.0000 mg | ORAL_TABLET | ORAL | 0 refills | Status: DC
  Filled 2022-06-15: qty 2, 1d supply, fill #0

## 2022-06-15 MED FILL — Rosuvastatin Calcium Tab 10 MG: ORAL | 30 days supply | Qty: 30 | Fill #0 | Status: AC

## 2022-06-17 ENCOUNTER — Other Ambulatory Visit: Payer: Self-pay

## 2022-07-13 ENCOUNTER — Other Ambulatory Visit: Payer: Self-pay | Admitting: Family Medicine

## 2022-07-13 ENCOUNTER — Other Ambulatory Visit: Payer: Self-pay

## 2022-07-13 MED FILL — Rosuvastatin Calcium Tab 10 MG: ORAL | 30 days supply | Qty: 30 | Fill #1 | Status: AC

## 2022-07-13 NOTE — Telephone Encounter (Signed)
Last OV was on 04/05/22.    Imitrex last filled on 03/31/22 #27 tabs/ 0 refills   Vit D last filled on 03/31/22 #12 caps/ 0 refill     

## 2022-07-14 ENCOUNTER — Other Ambulatory Visit: Payer: Self-pay

## 2022-07-14 MED FILL — Ergocalciferol Cap 1.25 MG (50000 Unit): ORAL | 28 days supply | Qty: 4 | Fill #0 | Status: AC

## 2022-07-14 MED FILL — Sumatriptan Succinate Tab 100 MG: ORAL | 15 days supply | Qty: 9 | Fill #0 | Status: AC

## 2022-07-14 NOTE — Telephone Encounter (Signed)
Last OV was on 04/05/22.    Imitrex last filled on 03/31/22 #27 tabs/ 0 refills   Vit D last filled on 03/31/22 #12 caps/ 0 refill

## 2022-07-15 ENCOUNTER — Other Ambulatory Visit: Payer: Self-pay

## 2022-07-20 ENCOUNTER — Other Ambulatory Visit: Payer: Self-pay

## 2022-08-13 ENCOUNTER — Other Ambulatory Visit: Payer: Self-pay

## 2022-08-13 ENCOUNTER — Other Ambulatory Visit: Payer: Self-pay | Admitting: Family Medicine

## 2022-08-13 MED FILL — Escitalopram Oxalate Tab 20 MG (Base Equiv): ORAL | 30 days supply | Qty: 30 | Fill #0 | Status: AC

## 2022-08-13 MED FILL — Rosuvastatin Calcium Tab 10 MG: ORAL | 30 days supply | Qty: 30 | Fill #2 | Status: AC

## 2022-08-13 MED FILL — Ergocalciferol Cap 1.25 MG (50000 Unit): ORAL | 28 days supply | Qty: 4 | Fill #1 | Status: AC

## 2022-08-13 MED FILL — Ibuprofen Tab 800 MG: ORAL | 10 days supply | Qty: 30 | Fill #0 | Status: AC

## 2022-08-13 MED FILL — Sumatriptan Succinate Tab 100 MG: ORAL | 15 days supply | Qty: 9 | Fill #1 | Status: AC

## 2022-08-13 NOTE — Telephone Encounter (Signed)
LOV: 04/05/22  Lexapro: last refill 03/31/22 #90 w/ no refills   Ibuprofen: last refill 07/15/2021 #30 w/ 3 refills   NOV: Not schdeuled

## 2022-08-22 ENCOUNTER — Ambulatory Visit
Admission: RE | Admit: 2022-08-22 | Discharge: 2022-08-22 | Disposition: A | Discharge status: Home / Self Care | Payer: 59 | Source: Ambulatory Visit | Attending: Emergency Medicine | Admitting: Emergency Medicine

## 2022-08-22 VITALS — BP 93/62 | HR 85 | Temp 98.8°F | Resp 18

## 2022-08-22 DIAGNOSIS — U071 COVID-19: Secondary | ICD-10-CM

## 2022-08-22 MED ORDER — NIRMATRELVIR/RITONAVIR (PAXLOVID)TABLET: 3.0000 | ORAL_TABLET | Freq: Two times a day (BID) | ORAL | 0 refills | Status: AC

## 2022-08-22 NOTE — ED Provider Notes (Signed)
[name]    CSN: [id number] Arrival date & time: 08/22/22  1142      History   Chief Complaint Chief Complaint  Patient presents with   Generalized Body Aches    Tested positive for Covid - Entered by patient    HPI Evelyn Shepherd is a 62 y.o. female.  Patient presents with 2-day history of bodyaches, headache, generalized malaise.  Treating with ibuprofen.  She and her husband both tested positive for COVID at home yesterday evening.  She denies fever, cough, shortness of breath, chest pain, or other symptoms.  Her medical history includes prediabetes, hypothyroidism, migraine headaches, GERD, chronic back pain, depression, fibromyalgia.  The history is provided by the patient and medical records.    Past Medical History:  Diagnosis Date   History of depression    History of gastroesophageal reflux (GERD)    History of hyperlipidemia    History of migraine    History of obesity    lap band surgery   History of pneumonia 2005    Patient Active Problem List   Diagnosis Date Noted   Sinus pressure 08/12/2021   Acute recurrent maxillary sinusitis 08/06/2021   Urinary incontinence 01/30/2021   Fibromyalgia 01/30/2021   Need for immunization against influenza 11/04/2020   Vitamin D deficiency 05/06/2020   Hip pain 05/01/2019   Muscle cramping 05/01/2019   Bariatric surgery status 05/01/2019   Urinary frequency 11/23/2018   Screening mammogram, encounter for 11/23/2018   B12 deficiency 11/23/2018   Vertigo 07/17/2018   Former smoker 06/08/2017   History of shingles 01/22/2016   Lumbar pain 12/26/2013   Fatigue 11/07/2013   Encounter for routine gynecological examination 04/11/2013   Colon cancer screening 02/13/2013   Screening for breast cancer 04/13/2011   Special screening for malignant neoplasms, colon 04/13/2011   Hypothyroidism 08/11/2007   LOW BACK PAIN, CHRONIC 07/14/2006   Prediabetes 07/06/2006   Hyperlipidemia 07/06/2006   Depression,  recurrent ([facility]) 07/06/2006   Migraine without aura 07/06/2006   GERD 07/06/2006    Past Surgical History:  Procedure Laterality Date   AUGMENTATION MAMMAPLASTY Bilateral 1991   BREAST ENHANCEMENT SURGERY     CESAREAN SECTION     DENTAL SURGERY     LAPAROSCOPIC GASTRIC BANDING  07/31/07   LUMBAR DISC SURGERY     TONGUE SURGERY  03/2005   lesion removal    OB History   No obstetric history on file.      Home Medications    Prior to Admission medications   Medication Sig Start Date End Date Taking? Authorizing Provider  nirmatrelvir/ritonavir (PAXLOVID) 20 x 150 MG & 10 x 100MG  TABS Take 3 tablets by mouth 2 (two) times daily for 5 days. Take nirmatrelvir (150 mg) two tablets twice daily for 5 days and ritonavir (100 mg) one tablet twice daily for 5 days. 08/22/22 08/27/22 Yes Mickie Bail, NP  albuterol (VENTOLIN HFA) 108 (90 Base) MCG/ACT inhaler Inhale 1-2 puffs into the lungs every 6 (six) hours as needed (cough). 09/14/20   Boddu, Belenda Cruise, FNP  celecoxib (CELEBREX) 100 MG capsule Take 1 capsule (100 mg total) by mouth 2 (two) times daily with a meal. 02/05/22     cyanocobalamin (VITAMIN B12) 1000 MCG/ML injection Inject 1 mL (1,000 mcg total) into the muscle every 30 (thirty) days. 06/09/22   Tower, Audrie Gallus, MD  diazepam (VALIUM) 5 MG tablet Take 1 tablet (5 mg total) by mouth as needed for 1 day. 06/10/22  diazepam (VALIUM) 5 MG tablet Take 1 tablet (5 mg total) by mouth 60 minutes prior to procedure. May repeat in 30 minutes if needed. 06/15/22     escitalopram (LEXAPRO) 20 MG tablet Take 1 tablet (20 mg total) by mouth daily. 08/13/22   Tower, Audrie Gallus, MD  fluticasone Aleda Grana) 50 MCG/ACT nasal spray Spray two sprays in each nostril once daily 09/09/21     ibuprofen (ADVIL) 800 MG tablet Take 1 tablet (800 mg total) by mouth every 8 (eight) hours as needed (with food). 08/13/22   Tower, Audrie Gallus, MD  ketoconazole (NIZORAL) 2 % cream Apply topically daily. 07/15/21   Tower, Audrie Gallus, MD   Magnesium Citrate 125 MG CAPS Take 1 capsule by mouth in the morning and at bedtime. 06/15/22   Tower, Audrie Gallus, MD  meclizine (ANTIVERT) 25 MG tablet Take 1 tablet (25 mg total) by mouth as needed for dizziness. 07/15/21   Tower, Audrie Gallus, MD  meloxicam (MOBIC) 15 MG tablet Take 1 tablet (15 mg total) by mouth daily. Patient not taking: Reported on 04/05/2022 10/09/21     mometasone (ELOCON) 0.1 % lotion Apply to itchy ears twice daily for 12 days.  May repeat as needed for itchy ears 09/09/21     rosuvastatin (CRESTOR) 10 MG tablet Take 1 tablet (10 mg total) by mouth daily. 06/15/22   Tower, Audrie Gallus, MD  SUMAtriptan (IMITREX) 100 MG tablet Take 1 tablet (100 mg total) by mouth once as needed for up to 1 dose for headache, can repeat dose in 2 hours if needed, maximum dose of 2 tablets in one day 07/14/22   Tower, Audrie Gallus, MD  Syringe/Needle, Disp, (SYRINGE 3CC/21GX1") 21G X 1" 3 ML MISC [place] with B12 injections 06/10/22   Tower, Audrie Gallus, MD  tiZANidine (ZANAFLEX) 2 MG tablet Take 1 tablet (2 mg total) by mouth every 8 (eight) hours Patient not taking: Reported on 04/05/2022 12/18/21     tiZANidine (ZANAFLEX) 4 MG tablet Take 1 tablet (4 mg total) by mouth every 8 (eight) hours as needed for 15 days. Patient not taking: Reported on 04/05/2022 10/28/21     tiZANidine (ZANAFLEX) 4 MG tablet Take 1 tablet (4 mg total) by mouth every 8 (eight) hours as needed. 02/05/22     Vitamin D, Ergocalciferol, (DRISDOL) 1.25 MG (50000 UNIT) CAPS capsule Take 1 capsule (50,000 Units total) by mouth every 7 (seven) days. 07/14/22   Tower, Audrie Gallus, MD    Family History Family History  Problem Relation Age of Onset   Kidney cancer Mother    Diabetes Mother    Heart attack Mother    Pneumonia Father        died   Lung cancer Father    Depression Brother        commited suicide   Alcohol abuse Brother    Breast cancer Paternal Aunt    Breast cancer Paternal Aunt    Early menopause Other        family   Colon cancer Neg Hx     Pancreatic cancer Neg Hx    Rectal cancer Neg Hx    Stomach cancer Neg Hx     Social History Social History   Tobacco Use   Smoking status: Some Days    Current packs/day: 0.00    Average packs/day: 1.5 packs/day for 12.0 years (18.0 ttl pk-yrs)    Types: Cigarettes    Start date: 09/25/2005    Last attempt to quit:  09/25/2017    Years since quitting: 4.9   Smokeless tobacco: Never   Tobacco comments:    Smokes 1 cigarette daily   Vaping Use   Vaping status: Never Used  Substance Use Topics   Alcohol use: Yes    Alcohol/week: 0.0 standard drinks of alcohol    Comment: occasional on monthly basis   Drug use: No     Allergies   Codeine, Meloxicam, and Sulfonamide derivatives   Review of Systems Review of Systems  Constitutional:  Positive for fatigue. Negative for chills and fever.  HENT:  Negative for ear pain and sore throat.   Respiratory:  Negative for cough and shortness of breath.   Cardiovascular:  Negative for chest pain and palpitations.  Neurological:  Positive for headaches. Negative for dizziness, weakness and numbness.     Physical Exam Triage Vital Signs ED Triage Vitals  Encounter Vitals Group     BP 08/22/22 1158 (!) 88/51     Systolic BP Percentile --      Diastolic BP Percentile --      Pulse Rate 08/22/22 1150 85     Resp 08/22/22 1150 18     Temp 08/22/22 1150 98.8 F (37.1 C)     Temp src --      SpO2 08/22/22 1150 96 %     Weight --      Height --      Head Circumference --      Peak Flow --      Pain Score 08/22/22 1202 0     Pain Loc --      Pain Education --      Exclude from Growth Chart --    No data found.  Updated Vital Signs BP 93/62 (BP Location: Left Arm)   Pulse 85   Temp 98.8 F (37.1 C)   Resp 18   SpO2 96%   Visual Acuity Right Eye Distance:   Left Eye Distance:   Bilateral Distance:    Right Eye Near:   Left Eye Near:    Bilateral Near:     Physical Exam Vitals and nursing note reviewed.   Constitutional:      General: She is not in acute distress.    Appearance: She is well-developed.  HENT:     Right Ear: Tympanic membrane normal.     Left Ear: Tympanic membrane normal.     Nose: Nose normal.     Mouth/Throat:     Mouth: Mucous membranes are moist.     Pharynx: Oropharynx is clear.  Cardiovascular:     Rate and Rhythm: Normal rate and regular rhythm.     Heart sounds: Normal heart sounds.  Pulmonary:     Effort: Pulmonary effort is normal. No respiratory distress.     Breath sounds: Normal breath sounds.  Musculoskeletal:     Cervical back: Neck supple.  Skin:    General: Skin is warm and dry.  Neurological:     Mental Status: She is alert.  Psychiatric:        Mood and Affect: Mood normal.        Behavior: Behavior normal.      UC Treatments / Results  Labs (all labs ordered are listed, but only abnormal results are displayed) Labs Reviewed - No data to display  EKG   Radiology No results found.  Procedures Procedures (including critical care time)  Medications Ordered in UC Medications - No data to display  Initial Impression /  Assessment and Plan / UC Course  I have reviewed the triage vital signs and the nursing notes.  Pertinent labs & imaging results that were available during my care of the patient were reviewed by me and considered in my medical decision making (see chart for details).   Positive COVID test at home, COVID.  Patient declines PCR test.  She and her husband tested positive for COVID yesterday evening.  She has been symptomatic for 2 days.  Treating with Paxlovid.  GFR 85 on 04/05/2022.  Discussed the side effects of Paxlovid, including dysgeusia, diarrhea, myalgias, hypertension.  Also discussed the possibility of rebound COVID.  Instructed patient to notify her PCP that she is COVID positive and taking Paxlovid.  ED precautions discussed.  Education provided on COVID.  Patient agrees to plan of care.    Final Clinical  Impressions(s) / UC Diagnoses   Final diagnoses:  Positive self-administered antigen test for COVID-19  COVID-19     Discharge Instructions      Take the Paxlovid as directed.  Take Tylenol as needed for fever or discomfort.  Rest and keep yourself hydrated.     Go to the emergency department if you have shortness of breath or other concerning symptoms.    Call your primary care provider to let them know that you are COVID positive and taking Paxlovid.         ED Prescriptions     Medication Sig Dispense Auth. Provider   nirmatrelvir/ritonavir (PAXLOVID) 20 x 150 MG & 10 x 100MG  TABS Take 3 tablets by mouth 2 (two) times daily for 5 days. Take nirmatrelvir (150 mg) two tablets twice daily for 5 days and ritonavir (100 mg) one tablet twice daily for 5 days. 30 tablet Mickie Bail, NP      PDMP not reviewed this encounter.   Mickie Bail, NP 08/22/22 1241

## 2022-08-22 NOTE — Discharge Instructions (Addendum)
 Take the Paxlovid as directed.  Take Tylenol as needed for fever or discomfort.  Rest and keep yourself hydrated.      Go to the emergency department if you have shortness of breath or other concerning symptoms.    Call your primary care provider to let them know that you are COVID positive and taking Paxlovid.

## 2022-08-22 NOTE — ED Triage Notes (Signed)
Patient presents to UC for HA since Thursday and generalized body aches since yesterday. Tested positive for COVID yesterday. Treating symptoms with ibuprofen.

## 2022-08-24 ENCOUNTER — Other Ambulatory Visit: Payer: Self-pay

## 2022-08-24 MED ORDER — DIAZEPAM 10 MG PO TABS
10.0000 mg | ORAL_TABLET | Freq: Once | ORAL | 0 refills | Status: AC
  Filled 2022-08-24: qty 1, 1d supply, fill #0

## 2022-09-22 MED FILL — Sumatriptan Succinate Tab 100 MG: ORAL | 15 days supply | Qty: 9 | Fill #2 | Status: AC

## 2022-09-22 MED FILL — Escitalopram Oxalate Tab 20 MG (Base Equiv): ORAL | 30 days supply | Qty: 30 | Fill #1 | Status: AC

## 2022-09-22 MED FILL — Ergocalciferol Cap 1.25 MG (50000 Unit): ORAL | 28 days supply | Qty: 4 | Fill #2 | Status: AC

## 2022-09-22 MED FILL — Ibuprofen Tab 800 MG: ORAL | 10 days supply | Qty: 30 | Fill #1 | Status: AC

## 2022-09-22 MED FILL — Rosuvastatin Calcium Tab 10 MG: ORAL | 30 days supply | Qty: 30 | Fill #3 | Status: AC

## 2022-09-23 ENCOUNTER — Other Ambulatory Visit: Payer: Self-pay

## 2022-10-19 ENCOUNTER — Other Ambulatory Visit: Payer: Self-pay

## 2022-10-20 ENCOUNTER — Encounter: Payer: Self-pay | Admitting: Family Medicine

## 2022-10-20 ENCOUNTER — Other Ambulatory Visit: Payer: Self-pay

## 2022-10-20 ENCOUNTER — Ambulatory Visit: Payer: 59 | Admitting: Family Medicine

## 2022-10-20 VITALS — BP 120/70 | HR 76 | Temp 98.6°F | Ht 65.0 in | Wt 219.0 lb

## 2022-10-20 DIAGNOSIS — R202 Paresthesia of skin: Secondary | ICD-10-CM

## 2022-10-20 DIAGNOSIS — L299 Pruritus, unspecified: Secondary | ICD-10-CM

## 2022-10-20 LAB — CBC WITH DIFFERENTIAL/PLATELET
Basophils Absolute: 0 10*3/uL (ref 0.0–0.1)
Basophils Relative: 0.9 % (ref 0.0–3.0)
Eosinophils Absolute: 0.1 10*3/uL (ref 0.0–0.7)
Eosinophils Relative: 2.7 % (ref 0.0–5.0)
HCT: 38.6 % (ref 36.0–46.0)
Hemoglobin: 12.9 g/dL (ref 12.0–15.0)
Lymphocytes Relative: 31.5 % (ref 12.0–46.0)
Lymphs Abs: 1.6 10*3/uL (ref 0.7–4.0)
MCHC: 33.3 g/dL (ref 30.0–36.0)
MCV: 88.7 fL (ref 78.0–100.0)
Monocytes Absolute: 0.3 10*3/uL (ref 0.1–1.0)
Monocytes Relative: 6.2 % (ref 3.0–12.0)
Neutro Abs: 3 10*3/uL (ref 1.4–7.7)
Neutrophils Relative %: 58.7 % (ref 43.0–77.0)
Platelets: 191 10*3/uL (ref 150.0–400.0)
RBC: 4.35 Mil/uL (ref 3.87–5.11)
RDW: 13.6 % (ref 11.5–15.5)
WBC: 5.2 10*3/uL (ref 4.0–10.5)

## 2022-10-20 LAB — COMPREHENSIVE METABOLIC PANEL
ALT: 17 U/L (ref 0–35)
AST: 14 U/L (ref 0–37)
Albumin: 3.8 g/dL (ref 3.5–5.2)
Alkaline Phosphatase: 53 U/L (ref 39–117)
BUN: 19 mg/dL (ref 6–23)
CO2: 29 meq/L (ref 19–32)
Calcium: 9.3 mg/dL (ref 8.4–10.5)
Chloride: 106 meq/L (ref 96–112)
Creatinine, Ser: 0.7 mg/dL (ref 0.40–1.20)
GFR: 92.91 mL/min (ref >60.00)
Glucose, Bld: 121 mg/dL — ABNORMAL HIGH (ref 70–99)
Potassium: 4.3 meq/L (ref 3.5–5.1)
Sodium: 141 meq/L (ref 135–145)
Total Bilirubin: 0.5 mg/dL (ref 0.2–1.2)
Total Protein: 5.2 g/dL — ABNORMAL LOW (ref 6.0–8.3)

## 2022-10-20 LAB — TSH: TSH: 1.95 u[IU]/mL (ref 0.35–5.50)

## 2022-10-20 MED ORDER — PREGABALIN 25 MG PO CAPS
25.0000 mg | ORAL_CAPSULE | Freq: Every evening | ORAL | 0 refills | Status: DC | PRN
  Filled 2022-10-20: qty 30, 18d supply, fill #0

## 2022-10-20 MED ORDER — TRIAMCINOLONE ACETONIDE 0.1 % EX CREA
1.0000 | TOPICAL_CREAM | Freq: Two times a day (BID) | CUTANEOUS | 0 refills | Status: DC
  Filled 2022-10-20: qty 30, 30d supply, fill #0

## 2022-10-20 NOTE — Progress Notes (Signed)
Patient ID: Evelyn Shepherd, female    DOB: 03/01/1960, 62 y.o.   MRN: [id number]  This visit was conducted in person.  BP 120/70 (BP Location: Left Arm, Patient Position: Sitting, Cuff Size: Normal)   Pulse 76   Temp 98.6 F (37 C) (Oral)   Ht 5\' 5"  (1.651 m)   Wt 219 lb (99.3 kg)   SpO2 96%   BMI 36.44 kg/m    CC:  Chief Complaint  Patient presents with   Rash    Itching     Subjective:   HPI: Evelyn Shepherd is a 62 y.o. female patient of Dr. Milinda Antis with history of prediabetes, recurrent depression, fibromyalgia, past bariatric surgery presenting on 10/20/2022 for Rash (Itching )   Reviewed recent urgent care note for COVID from August 22, 2022   She reports new onset bilateral arm itching over 1 to 2 months.  She has discussed this with her PCP in the past when it started.   Had similar last year.. treated with capsaicin.. thought could be from nerve.  Thought possibly due to neuralgia. Got better but has now returned in last 1-2 month.  Causing her trouble falling asleep.  Tingling, shooting sharp pain in upper arms,  itching in back of arms. [place].  Spread to upper back.  No numbness.  No rash except excoriations.  Has history of pinched nerve in neck pain.Marland Kitchen getting  steroid injection in cervical spine in last month.    Started Crestor in March 2024  No new lotions, no new detergents.  Has been trying lidocaine roll-on and capacasin.  Relevant past medical, surgical, family and social history reviewed and updated as indicated. Interim medical history since our last visit reviewed. Allergies and medications reviewed and updated. Outpatient Medications Prior to Visit  Medication Sig Dispense Refill   albuterol (VENTOLIN HFA) 108 (90 Base) MCG/ACT inhaler Inhale 1-2 puffs into the lungs every 6 (six) hours as needed (cough). 6.7 g 0   cyanocobalamin (VITAMIN B12) 1000 MCG/ML injection Inject 1 mL (1,000 mcg total) into the muscle every 30 (thirty) days. 1 mL 5    escitalopram (LEXAPRO) 20 MG tablet Take 1 tablet (20 mg total) by mouth daily. 90 tablet 2   fluticasone (FLONASE) 50 MCG/ACT nasal spray Spray two sprays in each nostril once daily 16 g 11   ibuprofen (ADVIL) 800 MG tablet Take 1 tablet (800 mg total) by mouth every 8 (eight) hours as needed (with food). 30 tablet 3   ketoconazole (NIZORAL) 2 % cream Apply topically daily. 60 g 0   Magnesium Citrate 125 MG CAPS Take 1 capsule by mouth in the morning and at bedtime. 180 capsule 3   meclizine (ANTIVERT) 25 MG tablet Take 1 tablet (25 mg total) by mouth as needed for dizziness. 30 tablet 0   mometasone (ELOCON) 0.1 % lotion Apply to itchy ears twice daily for 12 days.  May repeat as needed for itchy ears 60 mL 4   rosuvastatin (CRESTOR) 10 MG tablet Take 1 tablet (10 mg total) by mouth daily. 90 tablet 3   SUMAtriptan (IMITREX) 100 MG tablet Take 1 tablet (100 mg total) by mouth once as needed for up to 1 dose for headache, can repeat dose in 2 hours if needed, maximum dose of 2 tablets in one day 27 tablet 2   Syringe/Needle, Disp, (SYRINGE 3CC/21GX1") 21G X 1" 3 ML MISC [place] with B12 injections 1 each 5   tiZANidine (ZANAFLEX) 2 MG  tablet Take 1 tablet (2 mg total) by mouth every 8 (eight) hours 45 tablet 0   tiZANidine (ZANAFLEX) 4 MG tablet Take 1 tablet (4 mg total) by mouth every 8 (eight) hours as needed for 15 days. 45 tablet 0   Vitamin D, Ergocalciferol, (DRISDOL) 1.25 MG (50000 UNIT) CAPS capsule Take 1 capsule (50,000 Units total) by mouth every 7 (seven) days. 12 capsule 0   celecoxib (CELEBREX) 100 MG capsule Take 1 capsule (100 mg total) by mouth 2 (two) times daily with a meal. (Patient not taking: Reported on 10/20/2022) 60 capsule 0   diazepam (VALIUM) 5 MG tablet Take 1 tablet (5 mg total) by mouth as needed for 1 day. (Patient not taking: Reported on 10/20/2022) 2 tablet 0   diazepam (VALIUM) 5 MG tablet Take 1 tablet (5 mg total) by mouth 60 minutes prior to procedure. May repeat in 30  minutes if needed. (Patient not taking: Reported on 10/20/2022) 2 tablet 0   meloxicam (MOBIC) 15 MG tablet Take 1 tablet (15 mg total) by mouth daily. (Patient not taking: Reported on 10/20/2022) 30 tablet 0   tiZANidine (ZANAFLEX) 4 MG tablet Take 1 tablet (4 mg total) by mouth every 8 (eight) hours as needed. (Patient not taking: Reported on 10/20/2022) 45 tablet 0   No facility-administered medications prior to visit.     Per HPI unless specifically indicated in ROS section below Review of Systems  Constitutional:  Negative for fatigue and fever.  HENT:  Negative for congestion.   Eyes:  Negative for pain.  Respiratory:  Negative for cough and shortness of breath.   Cardiovascular:  Negative for chest pain, palpitations and leg swelling.  Gastrointestinal:  Negative for abdominal pain.  Genitourinary:  Negative for dysuria and vaginal bleeding.  Musculoskeletal:  Positive for neck pain. Negative for back pain.  Neurological:  Negative for syncope, light-headedness and headaches.  Psychiatric/Behavioral:  Negative for dysphoric mood.    Objective:  BP 120/70 (BP Location: Left Arm, Patient Position: Sitting, Cuff Size: Normal)   Pulse 76   Temp 98.6 F (37 C) (Oral)   Ht 5\' 5"  (1.651 m)   Wt 219 lb (99.3 kg)   SpO2 96%   BMI 36.44 kg/m   Wt Readings from Last 3 Encounters:  10/20/22 219 lb (99.3 kg)  04/05/22 217 lb (98.4 kg)  12/23/21 229 lb (103.9 kg)      Physical Exam Constitutional:      General: She is not in acute distress.    Appearance: Normal appearance. She is well-developed. She is not ill-appearing or toxic-appearing.  HENT:     Head: Normocephalic.     Right Ear: Hearing, tympanic membrane, ear canal and external ear normal. Tympanic membrane is not erythematous, retracted or bulging.     Left Ear: Hearing, tympanic membrane, ear canal and external ear normal. Tympanic membrane is not erythematous, retracted or bulging.     Nose: No mucosal edema or  rhinorrhea.     Right Sinus: No maxillary sinus tenderness or frontal sinus tenderness.     Left Sinus: No maxillary sinus tenderness or frontal sinus tenderness.     Mouth/Throat:     Mouth: Oropharynx is clear and moist and mucous membranes are normal.     Pharynx: Uvula midline.  Eyes:     General: Lids are normal. Lids are everted, no foreign bodies appreciated.     Extraocular Movements: EOM normal.     Conjunctiva/sclera: Conjunctivae normal.  Pupils: Pupils are equal, round, and reactive to light.  Neck:     Thyroid: No thyroid mass or thyromegaly.     Vascular: No carotid bruit.     Trachea: Trachea normal.  Cardiovascular:     Rate and Rhythm: Normal rate and regular rhythm.     Pulses: Normal pulses.     Heart sounds: Normal heart sounds, S1 normal and S2 normal. No murmur heard.    No friction rub. No gallop.  Pulmonary:     Effort: Pulmonary effort is normal. No tachypnea or respiratory distress.     Breath sounds: Normal breath sounds. No decreased breath sounds, wheezing, rhonchi or rales.  Abdominal:     General: Bowel sounds are normal.     Palpations: Abdomen is soft.     Tenderness: There is no abdominal tenderness.  Musculoskeletal:     Cervical back: Neck supple. Muscular tenderness present. No spinous process tenderness. Decreased range of motion.  Skin:    General: Skin is warm, dry and intact.     Findings: No rash.     Comments: Prominent hair follicles on posterior arms, dry skin  Neurological:     Mental Status: She is alert.  Psychiatric:        Mood and Affect: Mood is not anxious or depressed.        Speech: Speech normal.        Behavior: Behavior normal. Behavior is cooperative.        Thought Content: Thought content normal.        Cognition and Memory: Cognition and memory normal.        Judgment: Judgment normal.       Results for orders placed or performed in visit on 05/17/22  Lipid panel  Result Value Ref Range   Cholesterol 158  0 - 200 mg/dL   Triglycerides 188.4 0.0 - 149.0 mg/dL   HDL 16.60 >63.01 mg/dL   VLDL 60.1 0.0 - 09.3 mg/dL   LDL Cholesterol 80 0 - 99 mg/dL   Total CHOL/HDL Ratio 3    NonHDL 102.85   AST  Result Value Ref Range   AST 17 0 - 37 U/L  ALT  Result Value Ref Range   ALT 20 0 - 35 U/L    Assessment and Plan  Pruritus -     Comprehensive metabolic panel -     TSH -     CBC with Differential/Platelet  Tingling of both upper extremities  Other orders -     Pregabalin; Take 1 capsule (25 mg total) by mouth at bedtime as needed. Can increase to  twice daily or 2 tabs at bedtime  after 1 week  Dispense: 30 capsule; Refill: 0 -     Triamcinolone Acetonide; Apply 1 Application topically 2 (two) times daily.  Dispense: 30 g; Refill: 0  Acute itching in bilateral arms with associated tingling, burning and sharp shooting pain in arms.  She is currently being treated for certain for goal radiculopathy by orthopedics with steroid injections.  Pain in neck is tolerably controlled at this point. No rash present. Will evaluate with labs to assure no new thyroid, blood disorder, liver or kidney issue causing pruritus. Will treat with topical triamcinolone 0.1 percent twice daily to help with itching if from an allergic etiology.  Encouraged her to moisturize skin with nonalcohol containing moisturizing cream after bathing to avoid dry skin. Given unusual neuropathic description/nature of itching/arm pain and associated cervical radiculopathy, will also  try a trial of Lyrica 25 mg p.o. nightly at bedtime.  She has been instructed that she can try this twice daily or increase the dose at bedtime if needed. Return and ER precautions provided  No follow-ups on file.   Kerby Nora, MD

## 2022-11-12 ENCOUNTER — Other Ambulatory Visit: Payer: Self-pay | Admitting: Family Medicine

## 2022-11-14 ENCOUNTER — Other Ambulatory Visit: Payer: Self-pay

## 2022-11-14 ENCOUNTER — Other Ambulatory Visit: Payer: Self-pay | Admitting: Family Medicine

## 2022-11-15 MED FILL — Ergocalciferol Cap 1.25 MG (50000 Unit): ORAL | 28 days supply | Qty: 4 | Fill #0 | Status: AC

## 2022-11-15 NOTE — Telephone Encounter (Signed)
Please ask her how much lyrica she is using so I can refill  Dr Ermalene Searing prescription noted she could go up on it to twice daily if needed   How are her symptoms?

## 2022-11-15 NOTE — Telephone Encounter (Signed)
Refill request for  Vitamin D, Ergocalciferol, (DRISDOL) 1.25 MG (50000 UNIT) Cap capsule   LOV - 10/20/22 Next OV - not scheduled Last refill - 07/14/22 #12/0 Last Lab was 15.63 Next lab appt not scheduled

## 2022-11-16 ENCOUNTER — Other Ambulatory Visit: Payer: Self-pay

## 2022-11-16 ENCOUNTER — Other Ambulatory Visit: Payer: Self-pay | Admitting: Family Medicine

## 2022-11-16 MED FILL — Pregabalin Cap 25 MG: ORAL | 30 days supply | Qty: 30 | Fill #0 | Status: AC

## 2022-11-16 MED FILL — Triamcinolone Acetonide Cream 0.1%: CUTANEOUS | 30 days supply | Qty: 30 | Fill #0 | Status: AC

## 2022-11-16 NOTE — Telephone Encounter (Signed)
Pt does want to try med 2 caps at bedtime. She was only given 30 tabs so she has been taking it once daily but wants PCP to send in Rx saying 2 caps at bedtime, and refill cream. Pt said she is still itchy so she wants to try 2 pills but if she starts taking 2 pills daily and sxs are not improving she will call back and schedule a f/u

## 2022-11-16 NOTE — Telephone Encounter (Signed)
Duplicate request

## 2022-11-16 NOTE — Telephone Encounter (Signed)
Done  Hope this helps

## 2022-11-18 ENCOUNTER — Other Ambulatory Visit: Payer: Self-pay

## 2022-11-22 ENCOUNTER — Other Ambulatory Visit: Payer: Self-pay

## 2022-11-22 MED FILL — Escitalopram Oxalate Tab 20 MG (Base Equiv): ORAL | 30 days supply | Qty: 30 | Fill #2 | Status: AC

## 2022-11-22 MED FILL — Ibuprofen Tab 800 MG: ORAL | 10 days supply | Qty: 30 | Fill #2 | Status: AC

## 2022-11-22 MED FILL — Sumatriptan Succinate Tab 100 MG: ORAL | 15 days supply | Qty: 9 | Fill #3 | Status: AC

## 2022-11-22 MED FILL — Rosuvastatin Calcium Tab 10 MG: ORAL | 30 days supply | Qty: 30 | Fill #4 | Status: AC

## 2022-11-26 ENCOUNTER — Other Ambulatory Visit: Payer: Self-pay

## 2022-12-01 ENCOUNTER — Other Ambulatory Visit: Payer: Self-pay

## 2022-12-01 ENCOUNTER — Telehealth: Payer: Self-pay | Admitting: Family Medicine

## 2022-12-01 MED ORDER — PREGABALIN 25 MG PO CAPS
25.0000 mg | ORAL_CAPSULE | Freq: Two times a day (BID) | ORAL | 3 refills | Status: DC
  Filled 2022-12-01 – 2022-12-13 (×2): qty 60, 30d supply, fill #0
  Filled 2022-12-28 – 2023-01-17 (×2): qty 60, 30d supply, fill #1
  Filled 2023-02-24: qty 60, 30d supply, fill #2
  Filled 2023-04-09: qty 60, 30d supply, fill #3

## 2022-12-01 NOTE — Telephone Encounter (Signed)
Dr Ermalene Searing gave her prescription for daily with option to go up to bid or two daily for arm itching and tingling (cervical radiculopathy)  I will re send and change directions to bid Thanks for the heads up

## 2022-12-01 NOTE — Telephone Encounter (Signed)
Patient called in stating that pharmacy only gave her 30 pills instead of 60 of pregabalin (LYRICA) 25 MG capsule ,and she only have 1 week left of pills.She said that she spoke with the pharmacy about this and they said that is was written to dispense only 30 pills.

## 2022-12-01 NOTE — Telephone Encounter (Signed)
Called patient reviewed all information and repeated back to me. Will call if any questions.  ? ?

## 2022-12-01 NOTE — Telephone Encounter (Signed)
Looks like #60 was prescribed but it looks like directions say 1 cap at bedtime PRN so that's why they may only have given her 30 to match up with directions. Please review and advise

## 2022-12-06 ENCOUNTER — Other Ambulatory Visit: Payer: Self-pay

## 2022-12-13 ENCOUNTER — Other Ambulatory Visit: Payer: Self-pay

## 2022-12-28 ENCOUNTER — Other Ambulatory Visit: Payer: Self-pay | Admitting: Family Medicine

## 2022-12-28 ENCOUNTER — Other Ambulatory Visit: Payer: Self-pay

## 2022-12-28 MED ORDER — TRIAMCINOLONE ACETONIDE 0.1 % EX CREA
1.0000 | TOPICAL_CREAM | Freq: Two times a day (BID) | CUTANEOUS | 1 refills | Status: DC | PRN
  Filled 2022-12-28: qty 30, 30d supply, fill #0
  Filled 2023-10-29: qty 30, 30d supply, fill #1

## 2022-12-28 MED FILL — Cyanocobalamin Inj 1000 MCG/ML: INTRAMUSCULAR | 30 days supply | Qty: 1 | Fill #0 | Status: AC

## 2022-12-28 MED FILL — Escitalopram Oxalate Tab 20 MG (Base Equiv): ORAL | 30 days supply | Qty: 30 | Fill #3 | Status: AC

## 2022-12-28 MED FILL — Rosuvastatin Calcium Tab 10 MG: ORAL | 30 days supply | Qty: 30 | Fill #5 | Status: AC

## 2022-12-28 MED FILL — Ergocalciferol Cap 1.25 MG (50000 Unit): ORAL | 28 days supply | Qty: 4 | Fill #1 | Status: AC

## 2022-12-28 MED FILL — Sumatriptan Succinate Tab 100 MG: ORAL | 15 days supply | Qty: 9 | Fill #4 | Status: AC

## 2022-12-28 MED FILL — Ibuprofen Tab 800 MG: ORAL | 10 days supply | Qty: 30 | Fill #3 | Status: AC

## 2022-12-28 NOTE — Telephone Encounter (Signed)
Last filled on 11/16/22 # 30 g with 0 refills   Last OV was with Dr. Ermalene Searing on 10/20/22 for itching

## 2022-12-29 ENCOUNTER — Other Ambulatory Visit: Payer: Self-pay

## 2022-12-30 ENCOUNTER — Other Ambulatory Visit: Payer: Self-pay

## 2023-01-17 MED FILL — Cyanocobalamin Inj 1000 MCG/ML: INTRAMUSCULAR | 30 days supply | Qty: 1 | Fill #1 | Status: AC

## 2023-01-18 ENCOUNTER — Other Ambulatory Visit: Payer: Self-pay

## 2023-01-20 ENCOUNTER — Other Ambulatory Visit: Payer: Self-pay

## 2023-01-21 ENCOUNTER — Other Ambulatory Visit: Payer: Self-pay

## 2023-02-24 ENCOUNTER — Other Ambulatory Visit: Payer: Self-pay

## 2023-02-24 MED FILL — Cyanocobalamin Inj 1000 MCG/ML: INTRAMUSCULAR | 30 days supply | Qty: 1 | Fill #2 | Status: AC

## 2023-02-24 MED FILL — Ergocalciferol Cap 1.25 MG (50000 Unit): ORAL | 28 days supply | Qty: 4 | Fill #2 | Status: AC

## 2023-02-24 MED FILL — Rosuvastatin Calcium Tab 10 MG: ORAL | 30 days supply | Qty: 30 | Fill #6 | Status: AC

## 2023-02-25 ENCOUNTER — Other Ambulatory Visit: Payer: Self-pay

## 2023-02-27 ENCOUNTER — Encounter: Payer: Self-pay | Admitting: *Deleted

## 2023-02-27 ENCOUNTER — Ambulatory Visit
Admission: EM | Admit: 2023-02-27 | Discharge: 2023-02-27 | Disposition: A | Discharge status: Home / Self Care | Payer: 59 | Attending: Emergency Medicine | Admitting: Emergency Medicine

## 2023-02-27 DIAGNOSIS — Z720 Tobacco use: Secondary | ICD-10-CM | POA: Diagnosis not present

## 2023-02-27 DIAGNOSIS — J22 Unspecified acute lower respiratory infection: Secondary | ICD-10-CM

## 2023-02-27 DIAGNOSIS — J449 Chronic obstructive pulmonary disease, unspecified: Secondary | ICD-10-CM

## 2023-02-27 MED ORDER — ALBUTEROL SULFATE HFA 108 (90 BASE) MCG/ACT IN AERS: 2.0000 | INHALATION_SPRAY | Freq: Four times a day (QID) | RESPIRATORY_TRACT | 0 refills | Status: DC | PRN

## 2023-02-27 MED ORDER — PREDNISONE 50 MG PO TABS
50.0000 mg | ORAL_TABLET | Freq: Every day | ORAL | 0 refills | Status: AC
Start: 2023-02-27 — End: 2023-03-04

## 2023-02-27 NOTE — Discharge Instructions (Signed)
 Take prednisone and albuterol as prescribed Stop vaping Follow up with PCP, probably need sleep study Go to ER immediately or call 9-1-1 if you you develop chest pain, shortness of breath, palpitations, or worsening issues
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
## 2023-03-01 NOTE — Progress Notes (Unsigned)
 Virtual Visit via Video Note  I connected with [name] on 03/02/23 at  9:00 AM EST by a video enabled telemedicine application and verified that I am speaking with the correct person using two identifiers.  Patient Location: Home Provider Location: Office/Clinic  I discussed the limitations, risks, security, and privacy concerns of performing an evaluation and management service by video and the availability of in person appointments. I also discussed with the patient that there may be a patient responsible charge related to this service. The patient expressed understanding and agreed to proceed.  Parties involved in encounter  Patient: Evelyn Shepherd   Provider:  Roxy Manns MD   Subjective: PCP: Evelyn Pimple, MD  Chief Complaint  Patient presents with   UC Follow Up  OSA, wheezing HPI Pt presents for follow up from urgent care visit on 2/16 She was seen for wheezing and shortness of breath for [redacted] weeks along with day time somnolence  Noted she uses a vape Was prescription prednisone and albuterol  Encouraged follow up with pcp to discuss sleep eval for possible OSA   Is improved  Not much wheezing now at all / prednisone helped  A little cough when she lay down at night or wakes up with cough  Sleeps with head of bed raises   Nodding off a lot during the day Stays very tired / especially driving / can fall asleep at a red light  Snores  Has woken herself up snoring  Gets restless sleep and wakes up un refreshed   No throat swelling   Her sternum stays sore  No GERD symptoms    History of obesity with past bariatric surgery  Last weight in chart 240 with bmi of 39.94 on 2/16  Vapes now and then since Sunday  Is close to being done with it  Nicotine in the vape  Wants to stop     She did have sleep study in 2015 with Dr Shelle Iron  Noted: mild obstructive sleep apnea/hypopnea syndrome, with an AHI of 11 events per hour and oxygen desaturation as low as  80%. Treatment for this degree of sleep apnea can include a trial of weight loss alone, upper airway surgery, dental appliance, and also CPAP. Clinical correlation is suggested.   Never was treated   Lab Results  Component Value Date   HGBA1C 6.0 04/05/2022   HGBA1C 7.4 (H) 10/27/2021   HGBA1C 6.2 05/06/2020      ROS: Per HPI Review of Systems  Constitutional:  Positive for malaise/fatigue. Negative for fever.  HENT:  Negative for sore throat.   Respiratory:  Positive for cough and wheezing.        Cough and shortness of breath much improved Has a sore spot near xyphoid process when she presses on it   Snoring   Cardiovascular:  Negative for chest pain.  Gastrointestinal:  Negative for heartburn.  Neurological:  Negative for dizziness, sensory change, speech change, focal weakness and headaches.     Current Outpatient Medications:    albuterol (VENTOLIN HFA) 108 (90 Base) MCG/ACT inhaler, Inhale 2 puffs into the lungs every 6 (six) hours as needed (cough)., Disp: 6.7 g, Rfl: 0   cyanocobalamin (VITAMIN B12) 1000 MCG/ML injection, Inject 1 mL (1,000 mcg total) into the muscle every 30 (thirty) days., Disp: 1 mL, Rfl: 5   escitalopram (LEXAPRO) 20 MG tablet, Take 1 tablet (20 mg total) by mouth daily., Disp: 90 tablet, Rfl: 2   fluticasone (FLONASE)  50 MCG/ACT nasal spray, Spray two sprays in each nostril once daily, Disp: 16 g, Rfl: 11   ibuprofen (ADVIL) 800 MG tablet, Take 1 tablet (800 mg total) by mouth every 8 (eight) hours as needed (with food)., Disp: 30 tablet, Rfl: 3   ketoconazole (NIZORAL) 2 % cream, Apply topically daily., Disp: 60 g, Rfl: 0   Magnesium Citrate 125 MG CAPS, Take 1 capsule by mouth in the morning and at bedtime., Disp: 180 capsule, Rfl: 3   meclizine (ANTIVERT) 25 MG tablet, Take 1 tablet (25 mg total) by mouth as needed for dizziness., Disp: 30 tablet, Rfl: 0   mometasone (ELOCON) 0.1 % lotion, Apply to itchy ears twice daily for 12 days.  May repeat as  needed for itchy ears, Disp: 60 mL, Rfl: 4   predniSONE (DELTASONE) 50 MG tablet, Take 1 tablet (50 mg total) by mouth daily with breakfast for 5 days., Disp: 5 tablet, Rfl: 0   pregabalin (LYRICA) 25 MG capsule, Take 1 capsule (25 mg total) by mouth 2 (two) times daily., Disp: 60 capsule, Rfl: 3   rosuvastatin (CRESTOR) 10 MG tablet, Take 1 tablet (10 mg total) by mouth daily., Disp: 90 tablet, Rfl: 3   SUMAtriptan (IMITREX) 100 MG tablet, Take 1 tablet (100 mg total) by mouth once as needed for up to 1 dose for headache, can repeat dose in 2 hours if needed, maximum dose of 2 tablets in one day, Disp: 27 tablet, Rfl: 2   Syringe/Needle, Disp, (SYRINGE 3CC/21GX1") 21G X 1" 3 ML MISC, Use with B12 injections, Disp: 1 each, Rfl: 5   tiZANidine (ZANAFLEX) 2 MG tablet, Take 1 tablet (2 mg total) by mouth every 8 (eight) hours, Disp: 45 tablet, Rfl: 0   tiZANidine (ZANAFLEX) 4 MG tablet, Take 1 tablet (4 mg total) by mouth every 8 (eight) hours as needed for 15 days., Disp: 45 tablet, Rfl: 0   triamcinolone cream (KENALOG) 0.1 %, Apply 1 Application topically 2 (two) times daily as needed (to affected areas)., Disp: 30 g, Rfl: 1   Vitamin D, Ergocalciferol, (DRISDOL) 1.25 MG (50000 UNIT) CAPS capsule, Take 1 capsule (50,000 Units total) by mouth every 7 (seven) days., Disp: 12 capsule, Rfl: 1  Observations/Objective: There were no vitals filed for this visit. Physical Exam Patient appears well, in no distress Weight is baseline  No facial swelling or asymmetry Normal voice-not hoarse and no slurred speech No obvious tremor or mobility impairment Moving neck and UEs normally Able to hear the call well  No cough or shortness of breath during interview  Talkative and mentally sharp with no cognitive changes No skin changes on face or neck , no rash or pallor Affect is normal   Assessment and Plan: OSA (obstructive sleep apnea) Assessment & Plan: Tested in 2015- mild and no treatment  Reviewed  that note and sleep study interpretation with pt today  Now weight is up significantly and snoring/ day time somnolence is worse (in fact nods off easily)  This is no doubt making it hard to loose weight and stay active   Referral done to pulmonary to touch base and plan testing   Discussed risks of sleep apnea incl car accidents, cva, Mi  Pt voices understanding and wants to be treated   Encouraged weight loss efforts  ? If GLP-1 may be covered in future for sleep apnea   Orders: -     Ambulatory referral to Pulmonology  Vapes nicotine containing substance Assessment & Plan:  Has cut down significantly since she had exacerbation of copd and thinks she can quit on her own  Suggested nicotine replacement/gum if needed and other strategies  Disc in detail risks of smoking and possible outcomes including copd, vascular/ heart disease, cancer , respiratory and sinus infections as well as osteoporosis  Pt voices understanding    OBESITY, MORBID Assessment & Plan: Discussed how this problem influences overall health and the risks it imposes  Reviewed plan for weight loss with lower calorie diet (via better food choices (lower glycemic and portion control) along with exercise building up to or more than 30 minutes 5 days per week including some aerobic activity and strength training   Weight has gradually increased since her bariatric surgery   Likely has OSA (will plan to get eval)  ? If GLP-1 may be approved for that diagnosis -she would be interested    Chronic obstructive pulmonary disease, unspecified COPD type (HCC) Assessment & Plan: Reviewed UC note and plan from 02/27/23 -seen for 2 wk of wheezing and shortness of breath in setting of nicotine vape use  Treated with prednisone and albuterol  Now much improved    Pt has cut back on nicotine vape with intent to quit  Discussed options like nicotine gum   Her recent exacerbation is much improved with prednisone and  albuterol mdi      Follow Up Instructions: No follow-ups on file.  I placed a referral to pulmonary to evaluate you for sleep apnea   Please let us know if you don't hear in 1-2 weeks to set that up   Please do not drive drowsy  Take care of yourself  Work on weight loss the best you can   Keep working on quitting vape/nicotine as well   If wheezing returns or worsens let us know     I discussed the assessment and treatment plan with the patient. The patient was provided an opportunity to ask questions, and all were answered. The patient agreed with the plan and demonstrated an understanding of the instructions.   The patient was advised to call back or seek an in-person evaluation if the symptoms worsen or if the condition fails to improve as anticipated.  The above assessment and management plan was discussed with the patient. The patient verbalized understanding of and has agreed to the management plan.   Roxy Manns, MD

## 2023-03-02 ENCOUNTER — Encounter: Payer: Self-pay | Admitting: Family Medicine

## 2023-03-02 ENCOUNTER — Telehealth (INDEPENDENT_AMBULATORY_CARE_PROVIDER_SITE_OTHER): Payer: 59 | Admitting: Family Medicine

## 2023-03-02 DIAGNOSIS — Z720 Tobacco use: Secondary | ICD-10-CM | POA: Diagnosis not present

## 2023-03-02 DIAGNOSIS — G4733 Obstructive sleep apnea (adult) (pediatric): Secondary | ICD-10-CM

## 2023-03-02 DIAGNOSIS — J449 Chronic obstructive pulmonary disease, unspecified: Secondary | ICD-10-CM | POA: Diagnosis not present

## 2023-03-02 NOTE — Patient Instructions (Addendum)
 I placed a referral to pulmonary to evaluate you for sleep apnea   Please let us know if you don't hear in 1-2 weeks to set that up   Please do not drive drowsy  Take care of yourself  Work on weight loss the best you can   Keep working on quitting vape/nicotine as well   If wheezing returns or worsens let us know    Schedule health maintenance visit when you can to get caught up on regular care and prevention

## 2023-03-02 NOTE — Assessment & Plan Note (Addendum)
 Reviewed UC note and plan from 02/27/23 -seen for 2 wk of wheezing and shortness of breath in setting of nicotine vape use  Treated with prednisone and albuterol  Now much improved    Pt has cut back on nicotine vape with intent to quit  Discussed options like nicotine gum   Her recent exacerbation is much improved with prednisone and albuterol mdi

## 2023-03-02 NOTE — Assessment & Plan Note (Signed)
 Discussed how this problem influences overall health and the risks it imposes  Reviewed plan for weight loss with lower calorie diet (via better food choices (lower glycemic and portion control) along with exercise building up to or more than 30 minutes 5 days per week including some aerobic activity and strength training   Weight has gradually increased since her bariatric surgery   Likely has OSA (will plan to get eval)  ? If GLP-1 may be approved for that diagnosis -she would be interested

## 2023-03-02 NOTE — Assessment & Plan Note (Addendum)
 Has cut down significantly since she had exacerbation of copd and thinks she can quit on her own  Suggested nicotine replacement/gum if needed and other strategies  Disc in detail risks of smoking and possible outcomes including copd, vascular/ heart disease, cancer , respiratory and sinus infections as well as osteoporosis  Pt voices understanding

## 2023-03-02 NOTE — Assessment & Plan Note (Addendum)
 Tested in 2015- mild and no treatment  Reviewed that note and sleep study interpretation with pt today  Now weight is up significantly and snoring/ day time somnolence is worse (in fact nods off easily)  This is no doubt making it hard to loose weight and stay active   Referral done to pulmonary to touch base and plan testing   Discussed risks of sleep apnea incl car accidents, cva, Mi  Pt voices understanding and wants to be treated   Encouraged weight loss efforts  ? If GLP-1 may be covered in future for sleep apnea

## 2023-03-08 ENCOUNTER — Encounter: Payer: Self-pay | Admitting: Sleep Medicine

## 2023-03-08 ENCOUNTER — Ambulatory Visit: Payer: 59 | Admitting: Sleep Medicine

## 2023-03-08 VITALS — BP 118/60 | HR 71 | Temp 96.9°F | Ht 65.0 in | Wt 243.8 lb

## 2023-03-08 DIAGNOSIS — F419 Anxiety disorder, unspecified: Secondary | ICD-10-CM

## 2023-03-08 DIAGNOSIS — Z87891 Personal history of nicotine dependence: Secondary | ICD-10-CM

## 2023-03-08 DIAGNOSIS — G4733 Obstructive sleep apnea (adult) (pediatric): Secondary | ICD-10-CM

## 2023-03-08 DIAGNOSIS — Z6841 Body Mass Index (BMI) 40.0 and over, adult: Secondary | ICD-10-CM

## 2023-03-08 DIAGNOSIS — F5104 Psychophysiologic insomnia: Secondary | ICD-10-CM | POA: Diagnosis not present

## 2023-03-08 NOTE — Progress Notes (Signed)
 Name:Evelyn Shepherd MRN: [id number] DOB: [birth year]   CHIEF COMPLAINT:  EXCESSIVE DAYTIME SLEEPINESS   HISTORY OF PRESENT ILLNESS:  Evelyn Shepherd is a 63 y.o. w/ a h/o OSA, morbid obesity s/p bariatric surgery, anxiety and depression who presents for reassessment of OSA. Reports that she was initially diagnosed with mild to moderate OSA (AHI 10) in 2015 but did not proceed with CPAP therapy. Reports a 40 lb weight gain over the last several years. Reports c/o loud snoring and excessive daytime sleepiness which has been present for several years. Reports nocturnal awakenings due to unclear reasons, and has difficulty falling back to sleep. Admits morning headaches and dry mouth. Denies night sweats or dream enactment. Denies a family history of sleep apnea. Reports drowsy driving. Drinks 1 glass of tea daily, occasional alcohol use, vapes throughout the day, denies illicit drug use.    Bedtime 10:30-11 pm Sleep onset 30-60 mins Rise time 6:30 am   EPWORTH SLEEP SCORE 18    03/08/2023    8:00 AM  Results of the Epworth flowsheet  Sitting and reading 3  Watching TV 2  Sitting, inactive in a public place (e.g. a theatre or a meeting) 2  As a passenger in a car for an hour without a break 3  Lying down to rest in the afternoon when circumstances permit 3  Sitting and talking to someone 1  Sitting quietly after a lunch without alcohol 2  In a car, while stopped for a few minutes in traffic 2  Total score 18    PAST MEDICAL HISTORY :   has a past medical history of History of depression, History of gastroesophageal reflux (GERD), History of hyperlipidemia, History of migraine, History of obesity, and History of pneumonia (2005).  has a past surgical history that includes Cesarean section; Breast enhancement surgery; Lumbar disc surgery; Dental surgery; Tongue surgery (03/2005); Laparoscopic gastric banding (07/31/07); and Augmentation mammaplasty (Bilateral, 1991). Prior to Admission  medications   Medication Sig Start Date End Date Taking? Authorizing Provider  albuterol (VENTOLIN HFA) 108 (90 Base) MCG/ACT inhaler Inhale 2 puffs into the lungs every 6 (six) hours as needed (cough). 02/27/23  Yes Defelice, Para March, NP  cyanocobalamin (VITAMIN B12) 1000 MCG/ML injection Inject 1 mL (1,000 mcg total) into the muscle every 30 (thirty) days. 12/28/22  Yes Tower, Audrie Gallus, MD  escitalopram (LEXAPRO) 20 MG tablet Take 1 tablet (20 mg total) by mouth daily. 08/13/22  Yes Tower, Audrie Gallus, MD  fluticasone [facility]) 50 MCG/ACT nasal spray Spray two sprays in each nostril once daily 09/09/21  Yes   ibuprofen (ADVIL) 800 MG tablet Take 1 tablet (800 mg total) by mouth every 8 (eight) hours as needed (with food). 08/13/22  Yes Tower, Audrie Gallus, MD  ketoconazole (NIZORAL) 2 % cream Apply topically daily. 07/15/21  Yes Tower, Audrie Gallus, MD  Magnesium Citrate 125 MG CAPS Take 1 capsule by mouth in the morning and at bedtime. 06/15/22  Yes Tower, Audrie Gallus, MD  meclizine (ANTIVERT) 25 MG tablet Take 1 tablet (25 mg total) by mouth as needed for dizziness. 07/15/21  Yes Tower, Marne A, MD  mometasone (ELOCON) 0.1 % lotion Apply to itchy ears twice daily for 12 days.  May repeat as needed for itchy ears 09/09/21  Yes   pregabalin (LYRICA) 25 MG capsule Take 1 capsule (25 mg total) by mouth 2 (two) times daily. 12/01/22  Yes Tower, Audrie Gallus, MD  rosuvastatin (CRESTOR) 10 MG tablet  Take 1 tablet (10 mg total) by mouth daily. 06/15/22  Yes Tower, Audrie Gallus, MD  SUMAtriptan (IMITREX) 100 MG tablet Take 1 tablet (100 mg total) by mouth once as needed for up to 1 dose for headache, can repeat dose in 2 hours if needed, maximum dose of 2 tablets in one day 07/14/22  Yes Tower, Audrie Gallus, MD  Syringe/Needle, Disp, (SYRINGE 3CC/21GX1") 21G X 1" 3 ML MISC Use with B12 injections 06/10/22  Yes Tower, Audrie Gallus, MD  tiZANidine (ZANAFLEX) 2 MG tablet Take 1 tablet (2 mg total) by mouth every 8 (eight) hours 12/18/21  Yes   tiZANidine  (ZANAFLEX) 4 MG tablet Take 1 tablet (4 mg total) by mouth every 8 (eight) hours as needed for 15 days. 10/28/21  Yes   triamcinolone cream (KENALOG) 0.1 % Apply 1 Application topically 2 (two) times daily as needed (to affected areas). 12/28/22  Yes Tower, Audrie Gallus, MD  Vitamin D, Ergocalciferol, (DRISDOL) 1.25 MG (50000 UNIT) CAPS capsule Take 1 capsule (50,000 Units total) by mouth every 7 (seven) days. 11/15/22  Yes Tower, Audrie Gallus, MD   Allergies  Allergen Reactions   Codeine Itching   Meloxicam     REACTION: unspecified   Sulfonamide Derivatives     REACTION: unspecified    FAMILY HISTORY:  family history includes Alcohol abuse in her brother; Breast cancer in her paternal aunt and paternal aunt; Depression in her brother; Diabetes in her mother; Early menopause in an other family member; Heart attack in her mother; Kidney cancer in her mother; Lung cancer in her father; Pneumonia in her father. SOCIAL HISTORY:  reports that she quit smoking about 5 years ago. Her smoking use included cigarettes. She started smoking about 17 years ago. She has a 18 pack-year smoking history. She has never used smokeless tobacco. She reports current alcohol use. She reports that she does not use drugs.   Review of Systems:  Gen:  Denies  fever, sweats, chills weight loss  HEENT: Denies blurred vision, double vision, ear pain, eye pain, hearing loss, nose bleeds, sore throat Cardiac:  No dizziness, chest pain or heaviness, chest tightness,edema, No JVD Resp:   No cough, -sputum production, -shortness of breath,-wheezing, -hemoptysis,  Gi: Denies swallowing difficulty, stomach pain, nausea or vomiting, diarrhea, constipation, bowel incontinence Gu:  Denies bladder incontinence, burning urine Ext:   Denies Joint pain, stiffness or swelling Skin: Denies  skin rash, easy bruising or bleeding or hives Endoc:  Denies polyuria, polydipsia , polyphagia or weight change Psych:   Denies depression, insomnia or  hallucinations  Other:  All other systems negative  VITAL SIGNS: BP 118/60 (BP Location: Right Arm, Cuff Size: Large)   Pulse 71   Temp (!) 96.9 F (36.1 C)   Ht 5\' 5"  (1.651 m)   Wt 243 lb 12.8 oz (110.6 kg)   BMI 40.57 kg/m     Physical Examination:   General Appearance: No distress  EYES PERRLA, EOM intact.   NECK Supple, No JVD Mallampati III Pulmonary: normal breath sounds, No wheezing.  Cardiovascular Normal S1,S2.  No m/r/g.   Abdomen: Benign, Soft, non-tender. Skin:   warm, no rashes, no ecchymosis  Extremities: normal, no cyanosis, clubbing. Neuro:without focal findings,  speech normal  PSYCHIATRIC: Mood, affect within normal limits.   ASSESSMENT AND PLAN  OSA I suspect that OSA has worsened due to 40 lb weight gain. Discussed the consequences of untreated sleep apnea. Advised not to drive drowsy for safety of patient  and others. Will complete a home sleep study for reassessment of OSA and follow up to review results.    Morbid obesity Counseled patient on diet and lifestyle modification. Also discussed Tirzepatide as a weight loss option, patient will discuss further with her PCP.   Anxiety Stable, on current management. Following with PCP.   Insomnia Counseled patient on stimulus control and improving sleep hygiene practices.    MEDICATION ADJUSTMENTS/LABS AND TESTS ORDERED: Recommend Sleep Study   Patient  satisfied with Plan of action and management. All questions answered  Follow up to review HST results and treatment plan.   I spent a total of 30 minutes reviewing chart data, face-to-face evaluation with the patient, counseling and coordination of care as detailed above.    Tempie Hoist, M.D.  Sleep Medicine  Pulmonary & Critical Care Medicine

## 2023-03-08 NOTE — Patient Instructions (Signed)
 Evelyn Shepherd

## 2023-03-10 ENCOUNTER — Observation Stay: Payer: 59

## 2023-03-10 ENCOUNTER — Emergency Department: Payer: 59

## 2023-03-10 ENCOUNTER — Other Ambulatory Visit: Payer: Self-pay

## 2023-03-10 ENCOUNTER — Encounter: Payer: Self-pay | Admitting: Emergency Medicine

## 2023-03-10 ENCOUNTER — Ambulatory Visit
Admission: RE | Admit: 2023-03-10 | Discharge: 2023-03-10 | Disposition: A | Discharge status: Home / Self Care | Payer: 59 | Source: Ambulatory Visit | Attending: Emergency Medicine | Admitting: Emergency Medicine

## 2023-03-10 ENCOUNTER — Ambulatory Visit: Payer: Self-pay | Admitting: Family Medicine

## 2023-03-10 ENCOUNTER — Inpatient Hospital Stay
Admission: EM | Admit: 2023-03-10 | Discharge: 2023-03-15 | DRG: 202 | Disposition: A | Discharge status: Home / Self Care | Payer: 59 | Attending: Obstetrics and Gynecology | Admitting: Obstetrics and Gynecology

## 2023-03-10 ENCOUNTER — Ambulatory Visit (INDEPENDENT_AMBULATORY_CARE_PROVIDER_SITE_OTHER): Payer: 59

## 2023-03-10 VITALS — BP 91/60 | HR 92 | Temp 97.7°F | Resp 18

## 2023-03-10 DIAGNOSIS — G4733 Obstructive sleep apnea (adult) (pediatric): Secondary | ICD-10-CM | POA: Diagnosis present

## 2023-03-10 DIAGNOSIS — Z833 Family history of diabetes mellitus: Secondary | ICD-10-CM

## 2023-03-10 DIAGNOSIS — J449 Chronic obstructive pulmonary disease, unspecified: Secondary | ICD-10-CM | POA: Diagnosis present

## 2023-03-10 DIAGNOSIS — E1165 Type 2 diabetes mellitus with hyperglycemia: Secondary | ICD-10-CM | POA: Diagnosis present

## 2023-03-10 DIAGNOSIS — E1169 Type 2 diabetes mellitus with other specified complication: Secondary | ICD-10-CM | POA: Diagnosis present

## 2023-03-10 DIAGNOSIS — K219 Gastro-esophageal reflux disease without esophagitis: Secondary | ICD-10-CM | POA: Diagnosis present

## 2023-03-10 DIAGNOSIS — Z9884 Bariatric surgery status: Secondary | ICD-10-CM

## 2023-03-10 DIAGNOSIS — Z8249 Family history of ischemic heart disease and other diseases of the circulatory system: Secondary | ICD-10-CM

## 2023-03-10 DIAGNOSIS — M549 Dorsalgia, unspecified: Secondary | ICD-10-CM | POA: Diagnosis present

## 2023-03-10 DIAGNOSIS — Z79899 Other long term (current) drug therapy: Secondary | ICD-10-CM

## 2023-03-10 DIAGNOSIS — J211 Acute bronchiolitis due to human metapneumovirus: Secondary | ICD-10-CM | POA: Insufficient documentation

## 2023-03-10 DIAGNOSIS — M5410 Radiculopathy, site unspecified: Secondary | ICD-10-CM | POA: Insufficient documentation

## 2023-03-10 DIAGNOSIS — R7303 Prediabetes: Secondary | ICD-10-CM | POA: Diagnosis not present

## 2023-03-10 DIAGNOSIS — R062 Wheezing: Secondary | ICD-10-CM

## 2023-03-10 DIAGNOSIS — Z1152 Encounter for screening for COVID-19: Secondary | ICD-10-CM

## 2023-03-10 DIAGNOSIS — J9601 Acute respiratory failure with hypoxia: Secondary | ICD-10-CM | POA: Diagnosis present

## 2023-03-10 DIAGNOSIS — Z6841 Body Mass Index (BMI) 40.0 and over, adult: Secondary | ICD-10-CM

## 2023-03-10 DIAGNOSIS — E119 Type 2 diabetes mellitus without complications: Secondary | ICD-10-CM

## 2023-03-10 DIAGNOSIS — J205 Acute bronchitis due to respiratory syncytial virus: Principal | ICD-10-CM | POA: Diagnosis present

## 2023-03-10 DIAGNOSIS — F17290 Nicotine dependence, other tobacco product, uncomplicated: Secondary | ICD-10-CM | POA: Diagnosis present

## 2023-03-10 DIAGNOSIS — F32A Depression, unspecified: Secondary | ICD-10-CM | POA: Diagnosis present

## 2023-03-10 DIAGNOSIS — J440 Chronic obstructive pulmonary disease with acute lower respiratory infection: Secondary | ICD-10-CM | POA: Diagnosis present

## 2023-03-10 DIAGNOSIS — K222 Esophageal obstruction: Secondary | ICD-10-CM

## 2023-03-10 DIAGNOSIS — T380X5A Adverse effect of glucocorticoids and synthetic analogues, initial encounter: Secondary | ICD-10-CM | POA: Diagnosis present

## 2023-03-10 DIAGNOSIS — E039 Hypothyroidism, unspecified: Secondary | ICD-10-CM | POA: Diagnosis present

## 2023-03-10 DIAGNOSIS — E785 Hyperlipidemia, unspecified: Secondary | ICD-10-CM | POA: Diagnosis present

## 2023-03-10 DIAGNOSIS — R0602 Shortness of breath: Secondary | ICD-10-CM

## 2023-03-10 DIAGNOSIS — R052 Subacute cough: Secondary | ICD-10-CM

## 2023-03-10 DIAGNOSIS — F411 Generalized anxiety disorder: Secondary | ICD-10-CM | POA: Diagnosis present

## 2023-03-10 LAB — RESP PANEL BY RT-PCR (RSV, FLU A&B, COVID)  RVPGX2
Influenza A by PCR: NEGATIVE
Influenza B by PCR: NEGATIVE
Resp Syncytial Virus by PCR: POSITIVE — AB
SARS Coronavirus 2 by RT PCR: NEGATIVE

## 2023-03-10 LAB — BASIC METABOLIC PANEL
Anion gap: 9 (ref 5–15)
BUN: 22 mg/dL (ref 8–23)
CO2: 24 mmol/L (ref 22–32)
Calcium: 8.6 mg/dL — ABNORMAL LOW (ref 8.9–10.3)
Chloride: 100 mmol/L (ref 98–111)
Creatinine, Ser: 0.86 mg/dL (ref 0.44–1.00)
GFR, Estimated: >60 mL/min (ref >60)
Glucose, Bld: 198 mg/dL — ABNORMAL HIGH (ref 70–99)
Potassium: 3.7 mmol/L (ref 3.5–5.1)
Sodium: 133 mmol/L — ABNORMAL LOW (ref 135–145)

## 2023-03-10 LAB — CBC
HCT: 41.3 % (ref 36.0–46.0)
Hemoglobin: 13.6 g/dL (ref 12.0–15.0)
MCH: 30 pg (ref 26.0–34.0)
MCHC: 32.9 g/dL (ref 30.0–36.0)
MCV: 91.2 fL (ref 80.0–100.0)
Platelets: 147 10*3/uL — ABNORMAL LOW (ref 150–400)
RBC: 4.53 MIL/uL (ref 3.87–5.11)
RDW: 12.9 % (ref 11.5–15.5)
WBC: 6.2 10*3/uL (ref 4.0–10.5)
nRBC: 0 % (ref 0.0–0.2)

## 2023-03-10 LAB — HEMOGLOBIN A1C
Hgb A1c MFr Bld: 7.1 % — ABNORMAL HIGH (ref 4.8–5.6)
Mean Plasma Glucose: 157.07 mg/dL

## 2023-03-10 MED ORDER — POLYETHYLENE GLYCOL 3350 17 G PO PACK
17.0000 g | PACK | Freq: Every day | ORAL | Status: DC | PRN
  Administered 2023-03-15: 17 g via ORAL
  Filled 2023-03-10: qty 1

## 2023-03-10 MED ORDER — ACETAMINOPHEN 650 MG RE SUPP: 650.0000 mg | Freq: Four times a day (QID) | RECTAL | Status: DC | PRN

## 2023-03-10 MED ORDER — ESCITALOPRAM OXALATE 10 MG PO TABS
20.0000 mg | ORAL_TABLET | Freq: Every day | ORAL | Status: DC
Start: 2023-03-11 — End: 2023-03-15
  Administered 2023-03-11 – 2023-03-15 (×5): 20 mg via ORAL
  Filled 2023-03-10 (×5): qty 2

## 2023-03-10 MED ORDER — AZITHROMYCIN 250 MG PO TABS
ORAL_TABLET | ORAL | 0 refills | Status: DC
  Filled 2023-03-10: qty 6, 5d supply, fill #0

## 2023-03-10 MED ORDER — ONDANSETRON HCL 4 MG PO TABS: 4.0000 mg | ORAL_TABLET | Freq: Four times a day (QID) | ORAL | Status: DC | PRN

## 2023-03-10 MED ORDER — ONDANSETRON HCL 4 MG/2ML IJ SOLN
4.0000 mg | Freq: Four times a day (QID) | INTRAMUSCULAR | Status: DC | PRN
  Administered 2023-03-12: 4 mg via INTRAVENOUS
  Filled 2023-03-10: qty 2

## 2023-03-10 MED ORDER — PREDNISONE 20 MG PO TABS
40.0000 mg | ORAL_TABLET | Freq: Every day | ORAL | 0 refills | Status: DC
  Filled 2023-03-10: qty 8, 4d supply, fill #0

## 2023-03-10 MED ORDER — KETOROLAC TROMETHAMINE 15 MG/ML IJ SOLN
15.0000 mg | Freq: Once | INTRAMUSCULAR | Status: AC
Start: 2023-03-10 — End: 2023-03-10
  Administered 2023-03-10: 15 mg via INTRAVENOUS
  Filled 2023-03-10: qty 1

## 2023-03-10 MED ORDER — ALBUTEROL SULFATE (2.5 MG/3ML) 0.083% IN NEBU
2.5000 mg | INHALATION_SOLUTION | Freq: Once | RESPIRATORY_TRACT | Status: AC
  Administered 2023-03-10: 2.5 mg via RESPIRATORY_TRACT

## 2023-03-10 MED ORDER — HYDROCOD POLI-CHLORPHE POLI ER 10-8 MG/5ML PO SUER
5.0000 mL | Freq: Two times a day (BID) | ORAL | Status: DC | PRN
  Administered 2023-03-10 – 2023-03-15 (×6): 5 mL via ORAL
  Filled 2023-03-10 (×6): qty 5

## 2023-03-10 MED ORDER — PREGABALIN 25 MG PO CAPS
25.0000 mg | ORAL_CAPSULE | Freq: Two times a day (BID) | ORAL | Status: DC
  Administered 2023-03-10 – 2023-03-15 (×10): 25 mg via ORAL
  Filled 2023-03-10 (×10): qty 1

## 2023-03-10 MED ORDER — PREDNISONE 20 MG PO TABS
40.0000 mg | ORAL_TABLET | Freq: Every day | ORAL | Status: DC
  Administered 2023-03-11 – 2023-03-15 (×5): 40 mg via ORAL
  Filled 2023-03-10 (×5): qty 2

## 2023-03-10 MED ORDER — ALBUTEROL SULFATE (2.5 MG/3ML) 0.083% IN NEBU
5.0000 mg | INHALATION_SOLUTION | Freq: Once | RESPIRATORY_TRACT | Status: AC
  Administered 2023-03-10: 5 mg via RESPIRATORY_TRACT
  Filled 2023-03-10: qty 6

## 2023-03-10 MED ORDER — SODIUM CHLORIDE 0.9% FLUSH
3.0000 mL | Freq: Two times a day (BID) | INTRAVENOUS | Status: DC
  Administered 2023-03-10 – 2023-03-15 (×10): 3 mL via INTRAVENOUS

## 2023-03-10 MED ORDER — ROSUVASTATIN CALCIUM 10 MG PO TABS
10.0000 mg | ORAL_TABLET | Freq: Every day | ORAL | Status: DC
  Administered 2023-03-11 – 2023-03-15 (×5): 10 mg via ORAL
  Filled 2023-03-10 (×5): qty 1

## 2023-03-10 MED ORDER — IPRATROPIUM-ALBUTEROL 0.5-2.5 (3) MG/3ML IN SOLN
3.0000 mL | Freq: Four times a day (QID) | RESPIRATORY_TRACT | Status: DC
  Administered 2023-03-10 – 2023-03-11 (×3): 3 mL via RESPIRATORY_TRACT
  Filled 2023-03-10 (×3): qty 3

## 2023-03-10 MED ORDER — BENZONATATE 100 MG PO CAPS
100.0000 mg | ORAL_CAPSULE | Freq: Three times a day (TID) | ORAL | 0 refills | Status: DC | PRN
  Filled 2023-03-10: qty 30, 10d supply, fill #0

## 2023-03-10 MED ORDER — ENOXAPARIN SODIUM 60 MG/0.6ML IJ SOSY
0.5000 mg/kg | PREFILLED_SYRINGE | INTRAMUSCULAR | Status: DC
  Administered 2023-03-10 – 2023-03-14 (×5): 55 mg via SUBCUTANEOUS
  Filled 2023-03-10 (×5): qty 0.6

## 2023-03-10 MED ORDER — MAGNESIUM SULFATE 2 GM/50ML IV SOLN
2.0000 g | INTRAVENOUS | Status: AC
  Administered 2023-03-10: 2 g via INTRAVENOUS
  Filled 2023-03-10: qty 50

## 2023-03-10 MED ORDER — IPRATROPIUM-ALBUTEROL 0.5-2.5 (3) MG/3ML IN SOLN: 3.0000 mL | Freq: Four times a day (QID) | RESPIRATORY_TRACT | Status: DC

## 2023-03-10 MED ORDER — IPRATROPIUM-ALBUTEROL 0.5-2.5 (3) MG/3ML IN SOLN
9.0000 mL | Freq: Once | RESPIRATORY_TRACT | Status: AC
  Administered 2023-03-10: 9 mL via RESPIRATORY_TRACT
  Filled 2023-03-10: qty 3

## 2023-03-10 MED ORDER — ACETAMINOPHEN 325 MG PO TABS
650.0000 mg | ORAL_TABLET | Freq: Four times a day (QID) | ORAL | Status: DC | PRN
  Administered 2023-03-12 – 2023-03-15 (×5): 650 mg via ORAL
  Filled 2023-03-10 (×5): qty 2

## 2023-03-10 MED ORDER — METHYLPREDNISOLONE SODIUM SUCC 125 MG IJ SOLR
125.0000 mg | INTRAMUSCULAR | Status: AC
  Administered 2023-03-10: 125 mg via INTRAVENOUS
  Filled 2023-03-10: qty 2

## 2023-03-10 NOTE — Assessment & Plan Note (Addendum)
 Patient reports at least 4-5 weeks of cough/wheezing/shortness of breath that improved after she was started on a prednisone taper on the 16th.  Her current symptoms may be due to RSV, however that does not explain why it was so ongoing prior to that.  She has a extensive tobacco use history, so certainly high risk for COPD however will need for PFTs to make this diagnosis though.  Given her daily vape use, concern for lung injury remains.  Due to this, we will obtain a CT.  - CT chest without contrast pending - Recommended she discuss future PFTs with her pulmonologist

## 2023-03-10 NOTE — Assessment & Plan Note (Signed)
 Hyperglycemic at 198 today with history of prediabetes.  Last A1c 6.0% in May 2024.  - Repeat A1c pending

## 2023-03-10 NOTE — H&P (Signed)
 History and Physical    Patient: Evelyn Shepherd:[id number] DOB: [birth year] DOA: 03/10/2023 DOS: the patient was seen and examined on 03/10/2023 PCP: Judy Pimple, MD  Patient coming from: Home  Chief Complaint:  Chief Complaint  Patient presents with   Shortness of Breath   HPI: Evelyn Shepherd is a 63 y.o. female with medical history significant of OSA, morbid obesity with BMI of 40, lumbar radiculopathy, B12 deficiency, prediabetes, who presents to the ED due to shortness of breath.  Evelyn Shepherd states that for the last 1 month, Evelyn Shepherd has been having a nonproductive cough and shortness of breath with wheezing.  Evelyn Shepherd was seen at an urgent care and started on albuterol and prednisone.  Evelyn Shepherd states that the prednisone seemed to help her breathing the most and Evelyn Shepherd completed the taper on 2/20.  Then within 24-48 hours, Evelyn Shepherd began to experience wheezing/shortness of breath again.  Evelyn Shepherd states that this time it is a bit different as Evelyn Shepherd is also experiencing hot and cold chills, headache.  Evelyn Shepherd denies any chest pain or palpitations.  Evelyn Shepherd notes that her husband has also been feeling unwell for the last few weeks.  Evelyn Shepherd recently established with pulmonology and Evelyn Shepherd has ordered Evelyn Shepherd a home sleep study, but Evelyn Shepherd has not received it yet.  Evelyn Shepherd endorses daily vaping but is willing to quit.  ED course: On arrival to the ED, patient was normotensive at 127/74 with heart rate of 70.  He was saturating at 93% on room air.  Evelyn Shepherd was afebrile at 97.6.  Initial workup notable for platelets of 147, sodium 133, glucose 198, creatinine 0.86 with GFR above 60.  RSV PCR positive.  Chest x-ray with no active disease.  Patient started on albuterol, DuoNebs, magnesium, Toradol and Solu-Medrol.  TRH contacted for admission.  Review of Systems: As mentioned in the history of present illness. All other systems reviewed and are negative.  Past Medical History:  Diagnosis Date   History of depression    History of  gastroesophageal reflux (GERD)    History of hyperlipidemia    History of migraine    History of obesity    lap band surgery   History of pneumonia 2005   Past Surgical History:  Procedure Laterality Date   AUGMENTATION MAMMAPLASTY Bilateral 1991   BREAST ENHANCEMENT SURGERY     CESAREAN SECTION     DENTAL SURGERY     LAPAROSCOPIC GASTRIC BANDING  07/31/07   LUMBAR DISC SURGERY     TONGUE SURGERY  03/2005   lesion removal   Social History:  reports that Evelyn Shepherd quit smoking about 5 years ago. Her smoking use included cigarettes. Evelyn Shepherd started smoking about 17 years ago. Evelyn Shepherd has a 18 pack-year smoking history. Evelyn Shepherd has never used smokeless tobacco. Evelyn Shepherd reports current alcohol use. Evelyn Shepherd reports that Evelyn Shepherd does not use drugs.  Allergies  Allergen Reactions   Codeine Itching   Meloxicam     REACTION: unspecified   Sulfonamide Derivatives     REACTION: unspecified    Family History  Problem Relation Age of Onset   Kidney cancer Mother    Diabetes Mother    Heart attack Mother    Pneumonia Father        died   Lung cancer Father    Depression Brother        commited suicide   Alcohol abuse Brother    Breast cancer Paternal Aunt    Breast cancer Paternal Aunt  Early menopause Other        family   Colon cancer Neg Hx    Pancreatic cancer Neg Hx    Rectal cancer Neg Hx    Stomach cancer Neg Hx     Prior to Admission medications   Medication Sig Start Date End Date Taking? Authorizing Provider  benzonatate (TESSALON PERLES) 100 MG capsule Take 1 capsule (100 mg total) by mouth 3 (three) times daily as needed for cough. 03/10/23 03/09/24 Yes Sharman Cheek, MD  predniSONE (DELTASONE) 20 MG tablet Take 2 tablets (40 mg total) by mouth daily with breakfast for 4 days. 03/10/23 03/14/23 Yes Sharman Cheek, MD  albuterol (VENTOLIN HFA) 108 (90 Base) MCG/ACT inhaler Inhale 2 puffs into the lungs every 6 (six) hours as needed (cough). 02/27/23   Defelice, Para March, NP  azithromycin  (ZITHROMAX) 250 MG tablet Take 2 tablets (500 mg total) by mouth daily for 1 day, THEN 1 tablet (250 mg total) daily for 4 days. 03/10/23 03/15/23  Mickie Bail, NP  cyanocobalamin (VITAMIN B12) 1000 MCG/ML injection Inject 1 mL (1,000 mcg total) into the muscle every 30 (thirty) days. 12/28/22   Tower, Audrie Gallus, MD  escitalopram (LEXAPRO) 20 MG tablet Take 1 tablet (20 mg total) by mouth daily. 08/13/22   Tower, Audrie Gallus, MD  fluticasone Aleda Grana) 50 MCG/ACT nasal spray Spray two sprays in each nostril once daily 09/09/21     ibuprofen (ADVIL) 800 MG tablet Take 1 tablet (800 mg total) by mouth every 8 (eight) hours as needed (with food). 08/13/22   Tower, Audrie Gallus, MD  ketoconazole (NIZORAL) 2 % cream Apply topically daily. 07/15/21   Tower, Audrie Gallus, MD  Magnesium Citrate 125 MG CAPS Take 1 capsule by mouth in the morning and at bedtime. 06/15/22   Tower, Audrie Gallus, MD  meclizine (ANTIVERT) 25 MG tablet Take 1 tablet (25 mg total) by mouth as needed for dizziness. 07/15/21   Tower, Audrie Gallus, MD  mometasone (ELOCON) 0.1 % lotion Apply to itchy ears twice daily for 12 days.  May repeat as needed for itchy ears 09/09/21     pregabalin (LYRICA) 25 MG capsule Take 1 capsule (25 mg total) by mouth 2 (two) times daily. 12/01/22   Tower, Audrie Gallus, MD  rosuvastatin (CRESTOR) 10 MG tablet Take 1 tablet (10 mg total) by mouth daily. 06/15/22   Tower, Audrie Gallus, MD  SUMAtriptan (IMITREX) 100 MG tablet Take 1 tablet (100 mg total) by mouth once as needed for up to 1 dose for headache, can repeat dose in 2 hours if needed, maximum dose of 2 tablets in one day 07/14/22   Tower, Audrie Gallus, MD  Syringe/Needle, Disp, (SYRINGE 3CC/21GX1") 21G X 1" 3 ML MISC Use with B12 injections 06/10/22   Tower, Audrie Gallus, MD  tiZANidine (ZANAFLEX) 2 MG tablet Take 1 tablet (2 mg total) by mouth every 8 (eight) hours 12/18/21     tiZANidine (ZANAFLEX) 4 MG tablet Take 1 tablet (4 mg total) by mouth every 8 (eight) hours as needed for 15 days. 10/28/21      triamcinolone cream (KENALOG) 0.1 % Apply 1 Application topically 2 (two) times daily as needed (to affected areas). 12/28/22   Tower, Audrie Gallus, MD  Vitamin D, Ergocalciferol, (DRISDOL) 1.25 MG (50000 UNIT) CAPS capsule Take 1 capsule (50,000 Units total) by mouth every 7 (seven) days. 11/15/22   Judy Pimple, MD    Physical Exam: Vitals:   03/10/23 1430 03/10/23 1435 03/10/23  1440 03/10/23 1600  BP: 117/69   122/74  Pulse: 96 92 (!) 106   Resp: (!) 21 18 15 18   Temp:    98.8 F (37.1 C)  TempSrc:    Oral  SpO2: 93% (!) 87% 99% 100%  Weight:      Height:       Physical Exam Vitals and nursing note reviewed.  Constitutional:      General: Evelyn Shepherd is not in acute distress.    Appearance: Evelyn Shepherd is obese. Evelyn Shepherd is not toxic-appearing.  HENT:     Head: Normocephalic and atraumatic.  Cardiovascular:     Rate and Rhythm: Normal rate and regular rhythm.     Heart sounds: No murmur heard. Pulmonary:     Effort: Tachypnea present. No respiratory distress.     Breath sounds: Wheezing (Diffuse inspiratory and expiratory wheezing.  Inspiratory wheezing resolves with deep cough) and rhonchi (Diffuse) present. No decreased breath sounds or rales.  Abdominal:     Palpations: Abdomen is soft.     Tenderness: There is no abdominal tenderness.  Musculoskeletal:     Right lower leg: No edema.     Left lower leg: No edema.  Skin:    General: Skin is warm and dry.  Neurological:     General: No focal deficit present.     Mental Status: Evelyn Shepherd is alert and oriented to person, place, and time.  Psychiatric:        Mood and Affect: Mood normal.        Behavior: Behavior normal.    Data Reviewed: CBC with WBC of 6.2, hemoglobin of 13.6, platelets 147 BMP with sodium of 133, potassium 3.7, bicarb 24, glucose 198, BUN 22, creatinine 0.86 with GFR above 60 RSV PCR positive  EKG personally reviewed.  Sinus rhythm with rate of 74.  Nonspecific T wave changes, however no acute ischemic changes.  DG Chest  2 View Result Date: 03/10/2023 CLINICAL DATA:  Cough, dyspnea with exertion. EXAM: CHEST - 2 VIEW COMPARISON:  Same day. FINDINGS: The heart size and mediastinal contours are within normal limits. Both lungs are clear. The visualized skeletal structures are unremarkable. IMPRESSION: No active cardiopulmonary disease. Electronically Signed   By: Lupita Raider M.D.   On: 03/10/2023 12:22   DG Chest 2 View Result Date: 03/10/2023 CLINICAL DATA:  Cough for 1 month, dyspnea with exertion. EXAM: CHEST - 2 VIEW COMPARISON:  December 16, 2020. FINDINGS: The heart size and mediastinal contours are within normal limits. Both lungs are clear. The visualized skeletal structures are unremarkable. IMPRESSION: No active cardiopulmonary disease. Electronically Signed   By: Lupita Raider M.D.   On: 03/10/2023 12:20   There are no new results to review at this time.  Assessment and Plan:  * RSV bronchitis Patient is presenting with recent recurrence of cough/shortness of breath/wheezing that began approximately 3-4 days ago, with RSV PCR positive.  Patient's oxygen saturation dipped to 87% on ambulation.  Given concern for possible COPD, will continue with acute COPD exacerbation protocol.  - Continuous pulse oximetry - Continue supplemental oxygen as needed to maintain oxygen saturation above 88% - Supportive management with Tylenol, Tussionex, guaifenesin - DuoNebs every 6 hours - S/p Solu-Medrol 125 mg once - Start prednisone 40 mg daily tomorrow  Shortness of breath Patient reports at least 4-5 weeks of cough/wheezing/shortness of breath that improved after Evelyn Shepherd was started on a prednisone taper on the 16th.  Her current symptoms may be due to RSV,  however that does not explain why it was so ongoing prior to that.  Evelyn Shepherd has a extensive tobacco use history, so certainly high risk for COPD however will need for PFTs to make this diagnosis though.  Given her daily vape use, concern for lung injury remains.   Due to this, we will obtain a CT.  - CT chest without contrast pending - Recommended Evelyn Shepherd discuss future PFTs with her pulmonologist  OSA (obstructive sleep apnea) Suspected OSA with home study ordered.  Prediabetes Hyperglycemic at 198 today with history of prediabetes.  Last A1c 6.0% in May 2024.  - Repeat A1c pending  Radiculopathy - Continue home Lyrica  Depression - Continue home Lexapro  Hyperlipidemia - Continue home rosuvastatin  Advance Care Planning:   Code Status: Full Code   Consults: None  Family Communication: No family at bedside  Severity of Illness: The appropriate patient status for this patient is OBSERVATION. Observation status is judged to be reasonable and necessary in order to provide the required intensity of service to ensure the patient's safety. The patient's presenting symptoms, physical exam findings, and initial radiographic and laboratory data in the context of their medical condition is felt to place them at decreased risk for further clinical deterioration. Furthermore, it is anticipated that the patient will be medically stable for discharge from the hospital within 2 midnights of admission.   Author: Verdene Lennert, MD 03/10/2023 4:22 PM  For on call review [email or web address].

## 2023-03-10 NOTE — ED Notes (Signed)
Pt placed on 2L via [place] at this time . 

## 2023-03-10 NOTE — ED Notes (Signed)
 Called CCMD for central monitoring at this time

## 2023-03-10 NOTE — ED Triage Notes (Signed)
 Patient to ED via POV from UC for SOB. Pt states she is wheezing and O2 has been around 92% RA. Given breathing tx at [facility] and z-pack. Recently finished prednisone for same.

## 2023-03-10 NOTE — Plan of Care (Signed)

## 2023-03-10 NOTE — Telephone Encounter (Signed)
 Agree with ER disposition, thanks  Will watch for correspondence

## 2023-03-10 NOTE — Assessment & Plan Note (Signed)
 Continue home rosuvastatin

## 2023-03-10 NOTE — ED Provider Notes (Addendum)
 Evelyn Shepherd    CSN: [id number] Arrival date & time: 03/10/23  0849      History   Chief Complaint Chief Complaint  Patient presents with   Cough   Nasal Congestion   Headache    HPI Evelyn Shepherd is a 63 y.o. female.  Patient presents with 1 month history of congestion, cough, shortness of breath with exertion, headache, fatigue.  She last used her albuterol inhaler yesterday.  No fever or chest pain.  Patient was seen at this urgent care on 02/27/2023; diagnosed with COPD, Vapes nicotine containing substance, lower respiratory tract infection; treated with albuterol inhaler and prednisone.  Patient had a video visit with her PCP on 03/02/2023; diagnosed with OSA, Vapes nicotine containing substance, morbid obesity, COPD; referral to pulmonology made.  Patient was seen by pulmonology on 03/08/2023; diagnosed with OSA, morbid obesity, anxiety, psychophysiologic insomnia; scheduled for home sleep test.  The history is provided by the patient and medical records.    Past Medical History:  Diagnosis Date   History of depression    History of gastroesophageal reflux (GERD)    History of hyperlipidemia    History of migraine    History of obesity    lap band surgery   History of pneumonia 2005    Patient Active Problem List   Diagnosis Date Noted   OSA (obstructive sleep apnea) 03/02/2023   Vapes nicotine containing substance 02/27/2023   Chronic obstructive pulmonary disease (HCC) 02/27/2023   Sinus pressure 08/12/2021   Urinary incontinence 01/30/2021   Fibromyalgia 01/30/2021   Need for immunization against influenza 11/04/2020   Vitamin D deficiency 05/06/2020   Hip pain 05/01/2019   Muscle cramping 05/01/2019   Bariatric surgery status 05/01/2019   Urinary frequency 11/23/2018   Screening mammogram, encounter for 11/23/2018   B12 deficiency 11/23/2018   Vertigo 07/17/2018   Former smoker 06/08/2017   History of shingles 01/22/2016   Lumbar pain 12/26/2013    Fatigue 11/07/2013   Encounter for routine gynecological examination 04/11/2013   Colon cancer screening 02/13/2013   Screening for breast cancer 04/13/2011   Special screening for malignant neoplasms, colon 04/13/2011   Hypothyroidism 08/11/2007   OBESITY, MORBID 07/14/2006   LOW BACK PAIN, CHRONIC 07/14/2006   Prediabetes 07/06/2006   Hyperlipidemia 07/06/2006   Depression, recurrent (HCC) 07/06/2006   Migraine without aura 07/06/2006   GERD 07/06/2006    Past Surgical History:  Procedure Laterality Date   AUGMENTATION MAMMAPLASTY Bilateral 1991   BREAST ENHANCEMENT SURGERY     CESAREAN SECTION     DENTAL SURGERY     LAPAROSCOPIC GASTRIC BANDING  07/31/07   LUMBAR DISC SURGERY     TONGUE SURGERY  03/2005   lesion removal    OB History   No obstetric history on file.      Home Medications    Prior to Admission medications   Medication Sig Start Date End Date Taking? Authorizing Provider  albuterol (VENTOLIN HFA) 108 (90 Base) MCG/ACT inhaler Inhale 2 puffs into the lungs every 6 (six) hours as needed (cough). 02/27/23  Yes Defelice, Para March, NP  azithromycin (ZITHROMAX) 250 MG tablet Take 2 tablets (500 mg total) by mouth daily for 1 day, THEN 1 tablet (250 mg total) daily for 4 days. 03/10/23 03/15/23 Yes Mickie Bail, NP  cyanocobalamin (VITAMIN B12) 1000 MCG/ML injection Inject 1 mL (1,000 mcg total) into the muscle every 30 (thirty) days. 12/28/22  Yes Tower, Audrie Gallus, MD  escitalopram (LEXAPRO) 20  MG tablet Take 1 tablet (20 mg total) by mouth daily. 08/13/22  Yes Tower, Audrie Gallus, MD  fluticasone [facility]) 50 MCG/ACT nasal spray Spray two sprays in each nostril once daily 09/09/21  Yes   ibuprofen (ADVIL) 800 MG tablet Take 1 tablet (800 mg total) by mouth every 8 (eight) hours as needed (with food). 08/13/22  Yes Tower, Audrie Gallus, MD  pregabalin (LYRICA) 25 MG capsule Take 1 capsule (25 mg total) by mouth 2 (two) times daily. 12/01/22  Yes Tower, Audrie Gallus, MD  rosuvastatin  (CRESTOR) 10 MG tablet Take 1 tablet (10 mg total) by mouth daily. 06/15/22  Yes Tower, Audrie Gallus, MD  SUMAtriptan (IMITREX) 100 MG tablet Take 1 tablet (100 mg total) by mouth once as needed for up to 1 dose for headache, can repeat dose in 2 hours if needed, maximum dose of 2 tablets in one day 07/14/22  Yes Tower, Audrie Gallus, MD  Vitamin D, Ergocalciferol, (DRISDOL) 1.25 MG (50000 UNIT) CAPS capsule Take 1 capsule (50,000 Units total) by mouth every 7 (seven) days. 11/15/22  Yes Tower, Audrie Gallus, MD  benzonatate (TESSALON PERLES) 100 MG capsule Take 1 capsule (100 mg total) by mouth 3 (three) times daily as needed for cough. 03/10/23 03/09/24  Sharman Cheek, MD  ketoconazole (NIZORAL) 2 % cream Apply topically daily. 07/15/21   Tower, Audrie Gallus, MD  Magnesium Citrate 125 MG CAPS Take 1 capsule by mouth in the morning and at bedtime. 06/15/22   Tower, Audrie Gallus, MD  meclizine (ANTIVERT) 25 MG tablet Take 1 tablet (25 mg total) by mouth as needed for dizziness. 07/15/21   Tower, Audrie Gallus, MD  mometasone (ELOCON) 0.1 % lotion Apply to itchy ears twice daily for 12 days.  May repeat as needed for itchy ears 09/09/21     predniSONE (DELTASONE) 20 MG tablet Take 2 tablets (40 mg total) by mouth daily with breakfast for 4 days. 03/10/23 03/14/23  Sharman Cheek, MD  Syringe/Needle, Disp, (SYRINGE 3CC/21GX1") 21G X 1" 3 ML MISC Use with B12 injections 06/10/22   Tower, Audrie Gallus, MD  tiZANidine (ZANAFLEX) 2 MG tablet Take 1 tablet (2 mg total) by mouth every 8 (eight) hours 12/18/21     tiZANidine (ZANAFLEX) 4 MG tablet Take 1 tablet (4 mg total) by mouth every 8 (eight) hours as needed for 15 days. 10/28/21     triamcinolone cream (KENALOG) 0.1 % Apply 1 Application topically 2 (two) times daily as needed (to affected areas). 12/28/22   Tower, Audrie Gallus, MD    Family History Family History  Problem Relation Age of Onset   Kidney cancer Mother    Diabetes Mother    Heart attack Mother    Pneumonia Father        died   Lung  cancer Father    Depression Brother        commited suicide   Alcohol abuse Brother    Breast cancer Paternal Aunt    Breast cancer Paternal Aunt    Early menopause Other        family   Colon cancer Neg Hx    Pancreatic cancer Neg Hx    Rectal cancer Neg Hx    Stomach cancer Neg Hx     Social History Social History   Tobacco Use   Smoking status: Former    Current packs/day: 0.00    Average packs/day: 1.5 packs/day for 12.0 years (18.0 ttl pk-yrs)    Types: Cigarettes  Start date: 09/25/2005    Quit date: 09/25/2017    Years since quitting: 5.4   Smokeless tobacco: Never  Vaping Use   Vaping status: Every Day  Substance Use Topics   Alcohol use: Yes    Alcohol/week: 0.0 standard drinks of alcohol    Comment: occasional on monthly basis   Drug use: No     Allergies   Codeine, Meloxicam, and Sulfonamide derivatives   Review of Systems Review of Systems  Constitutional:  Positive for fatigue. Negative for chills and fever.  HENT:  Positive for congestion and rhinorrhea. Negative for ear pain and sore throat.   Respiratory:  Positive for cough, shortness of breath and wheezing.   Cardiovascular:  Negative for chest pain and palpitations.  Neurological:  Positive for headaches.     Physical Exam Triage Vital Signs ED Triage Vitals [03/10/23 0905]  Encounter Vitals Group     BP      Systolic BP Percentile      Diastolic BP Percentile      Pulse Rate 92     Resp 18     Temp 97.7 F (36.5 C)     Temp src      SpO2 94 %     Weight      Height      Head Circumference      Peak Flow      Pain Score      Pain Loc      Pain Education      Exclude from Growth Chart    No data found.  Updated Vital Signs BP 91/60 (BP Location: Left Arm)   Pulse 92   Temp 97.7 F (36.5 C)   Resp 18   SpO2 93%   Visual Acuity Right Eye Distance:   Left Eye Distance:   Bilateral Distance:    Right Eye Near:   Left Eye Near:    Bilateral Near:     Physical  Exam Constitutional:      General: She is not in acute distress. HENT:     Right Ear: Tympanic membrane normal.     Left Ear: Tympanic membrane normal.     Nose: Nose normal.     Mouth/Throat:     Mouth: Mucous membranes are moist.     Pharynx: Oropharynx is clear.  Cardiovascular:     Rate and Rhythm: Normal rate and regular rhythm.     Heart sounds: Normal heart sounds.  Pulmonary:     Effort: Pulmonary effort is normal. No respiratory distress.     Breath sounds: Wheezing present.  Neurological:     Mental Status: She is alert.      UC Treatments / Results  Labs (all labs ordered are listed, but only abnormal results are displayed) Labs Reviewed - No data to display   EKG   Radiology DG Chest 2 View Result Date: 03/10/2023 CLINICAL DATA:  Cough, dyspnea with exertion. EXAM: CHEST - 2 VIEW COMPARISON:  Same day. FINDINGS: The heart size and mediastinal contours are within normal limits. Both lungs are clear. The visualized skeletal structures are unremarkable. IMPRESSION: No active cardiopulmonary disease. Electronically Signed   By: Lupita Raider M.D.   On: 03/10/2023 12:22   DG Chest 2 View Result Date: 03/10/2023 CLINICAL DATA:  Cough for 1 month, dyspnea with exertion. EXAM: CHEST - 2 VIEW COMPARISON:  December 16, 2020. FINDINGS: The heart size and mediastinal contours are within normal limits. Both lungs are clear.  The visualized skeletal structures are unremarkable. IMPRESSION: No active cardiopulmonary disease. Electronically Signed   By: Lupita Raider M.D.   On: 03/10/2023 12:20    Procedures Procedures (including critical care time)  Medications Ordered in UC Medications  albuterol (PROVENTIL) (2.5 MG/3ML) 0.083% nebulizer solution 2.5 mg (2.5 mg Nebulization Given 03/10/23 0940)    Initial Impression / Assessment and Plan / UC Course  I have reviewed the triage vital signs and the nursing notes.  Pertinent labs & imaging results that were available  during my care of the patient were reviewed by me and considered in my medical decision making (see chart for details).    Subacute cough, shortness of breath, wheezing. CXR negative.  O2 sat 93 to 94% on room air upon arrival.  Albuterol nebulizer treatment given here.  Patient reports improvement in her shortness of breath.  O2 sat 98% while on nebulizer treatment then returned to 93% on room air.  Treating with Zithromax.  Discussed albuterol inhaler every 4-6 hours as needed.  Instructed patient to follow-up with her PCP tomorrow.  ED precautions given.    Final Clinical Impressions(s) / UC Diagnoses   Final diagnoses:  Subacute cough  Shortness of breath  Wheezing     Discharge Instructions      Your chest x-ray is pending.    Take the Zithromax as directed.    Follow up with your primary care provider tomorrow.  Go to the emergency department if you have worsening symptoms.        ED Prescriptions     Medication Sig Dispense Auth. Provider   azithromycin (ZITHROMAX) 250 MG tablet Take 2 tablets (500 mg total) by mouth daily for 1 day, THEN 1 tablet (250 mg total) daily for 4 days. 6 tablet Mickie Bail, NP      PDMP not reviewed this encounter.   Mickie Bail, NP 03/10/23 1244    Mickie Bail, NP 03/10/23 1341

## 2023-03-10 NOTE — Assessment & Plan Note (Signed)
 Continue home Lexapro

## 2023-03-10 NOTE — Progress Notes (Signed)
 PHARMACIST - PHYSICIAN COMMUNICATION  CONCERNING:  Enoxaparin (Lovenox) for DVT Prophylaxis    RECOMMENDATION: Patient was prescribed enoxaprin 40mg  q24 hours for VTE prophylaxis.   Filed Weights   03/10/23 1102  Weight: 108.9 kg (240 lb)    Body mass index is 39.94 kg/m.  Estimated Creatinine Clearance: 83.3 mL/min (by C-G formula based on SCr of 0.86 mg/dL).  Based on [facility] policy patient is candidate for enoxaparin 0.5mg /kg TBW SQ every 24 hours based on BMI being >30.  DESCRIPTION: Pharmacy has adjusted enoxaparin dose per [facility] policy.  Patient is now receiving enoxaparin 55 mg every 24 hours    Rockwell Alexandria, PharmD Clinical Pharmacist  03/10/2023 4:14 PM

## 2023-03-10 NOTE — Telephone Encounter (Addendum)
 Copied from CRM [id number]. Topic: Clinical - Red Word Triage >> Mar 10, 2023 10:08 AM Lorin Glass B wrote: Red Word that prompted transfer to Nurse Triage: Shortness of breath, chest pain, cough, wheezing, headache. States they just left urgent care and was told to schedule an appt with her PCP after chest xray looked abnormal.   Chief Complaint: SOB Symptoms: SOB, cough, chest soreness from coughing Disposition: [x] ED /[] Urgent Care (no appt availability in office) / [] Appointment(In office/virtual)/ []  [facility]/ [] Home Care/ [] Refused Recommended Disposition /[] [facility]/ []  Follow-up with PCP  Additional Notes: Patient stated that she has been having SOB, cough and headaches for 3 weeks. Patient also has chest soreness from coughing. Patient went to Urgent Care 1 week ago and was given a prescription for Albuterol inhaler and Prednisone. She completed the Prednisone and has been using the inhaler. Patient does not feel like the treatments have been helping. Patient returned to Urgent Care this morning due to worsening SOB and she received an Albuterol treatment. Patient stated her O2 was being monitored at Urgent Care and her levels did not increase much. She believes that her O2 reading ranged between 90-94. She also said that she had a x-ray today that showed something abnormal. Patient sounded like she was struggling to breathe while on the phone with this RN. She was pausing in between sentences. Patient is currently with her spouse. Patient wanted to schedule an appt. This RN advised patient to be seen in the ED instead as soon as possible. Patient verbalized understanding.  Reason for Disposition  [1] MODERATE difficulty breathing (e.g., speaks in phrases, SOB even at rest, pulse 100-120) AND [2] NEW-onset or WORSE than normal  Answer Assessment - Initial Assessment Questions 1. RESPIRATORY STATUS: "Describe your breathing?" (e.g., wheezing, shortness of breath, unable  to speak, severe coughing)      Wheezing, coughing, SOB even while at rest  2. ONSET: "When did this breathing problem begin?"      3 weeks ago   3. PATTERN "Does the difficult breathing come and go, or has it been constant since it started?"      Constant  4. SEVERITY: "How bad is your breathing?" (e.g., mild, moderate, severe)    - MILD: No SOB at rest, mild SOB with walking, speaks normally in sentences, can lie down, no retractions, pulse < 100.    - MODERATE: SOB at rest, SOB with minimal exertion and prefers to sit, cannot lie down flat, speaks in phrases, mild retractions, audible wheezing, pulse 100-120.    - SEVERE: Very SOB at rest, speaks in single words, struggling to breathe, sitting hunched forward, retractions, pulse > 120        5. LUNG HISTORY: "Do you have any history of lung disease?"  (e.g., pulmonary embolus, asthma, emphysema)     No  6. CAUSE: "What do you think is causing the breathing problem?"      Patient stated the urgent care facility told her that she has COPD   7. OTHER SYMPTOMS: "Do you have any other symptoms? (e.g., dizziness, runny nose, cough, chest pain, fever)     Chest soreness from coughing  8. O2 SATURATION MONITOR:  "Do you use an oxygen saturation monitor (pulse oximeter) at home?" If Yes, ask: "What is your reading (oxygen level) today?" "What is your usual oxygen saturation reading?" (e.g., 95%)       Patient had her O2 level checked at urgent care and she believes that  it was ranging from 90-94 but she is not certain  Protocols used: Breathing Difficulty-A-AH

## 2023-03-10 NOTE — Assessment & Plan Note (Signed)
 Continue home [place].

## 2023-03-10 NOTE — ED Provider Notes (Addendum)
 [facility] Provider Note    Event Date/Time   First MD Initiated Contact with Patient 03/10/23 1135     (approximate)   History   Chief Complaint: Shortness of Breath   HPI  Evelyn Shepherd is a 63 y.o. female with a history of GERD, OSA, COPD who was sent to the ED due to shortness of breath.  Patient reports she has been sick for the past month.  Finished a 5-day course of prednisone 1 week ago, and then symptoms have been worsening again.  Has cough, chills, body aches, headache, shortness of breath.  Cough is nonproductive.  Went to urgent care earlier today, was prescribed azithromycin.  Called her doctor to set a follow-up appointment, and they told her to come to the ED instead.          Physical Exam   Triage Vital Signs: ED Triage Vitals  Encounter Vitals Group     BP 03/10/23 1100 127/74     Systolic BP Percentile --      Diastolic BP Percentile --      Pulse Rate 03/10/23 1100 70     Resp 03/10/23 1100 20     Temp 03/10/23 1100 97.6 F (36.4 C)     Temp Source 03/10/23 1100 Oral     SpO2 03/10/23 1100 93 %     Weight 03/10/23 1102 240 lb (108.9 kg)     Height 03/10/23 1102 5\' 5"  (1.651 m)     Head Circumference --      Peak Flow --      Pain Score 03/10/23 1101 7     Pain Loc --      Pain Education --      Exclude from Growth Chart --     Most recent vital signs: Vitals:   03/10/23 1435 03/10/23 1440  BP:    Pulse: 92 (!) 106  Resp: 18 15  Temp:    SpO2: (!) 87% 99%    General: Awake, no distress.  CV:  Good peripheral perfusion.  Regular rate rhythm Resp:  Normal effort.  Diffuse expiratory wheezing and prolonged expiratory phase.  Inducible wheezing and coughing with FEV1 maneuver. Abd:  No distention.  Soft nontender Other:  No lower extremity edema   ED Results / Procedures / Treatments   Labs (all labs ordered are listed, but only abnormal results are displayed) Labs Reviewed  RESP PANEL BY RT-PCR (RSV,  FLU A&B, COVID)  [id number] - Abnormal; Notable for the following components:      Result Value   Resp Syncytial Virus by PCR POSITIVE (*)    All other components within normal limits  BASIC METABOLIC PANEL - Abnormal; Notable for the following components:   Sodium 133 (*)    Glucose, Bld 198 (*)    Calcium 8.6 (*)    All other components within normal limits  CBC - Abnormal; Notable for the following components:   Platelets 147 (*)    All other components within normal limits     EKG Interpreted by me Normal sinus rhythm rate of 74.  Normal axis and intervals.  Poor R wave progression.  Normal ST segments and T waves.  No ischemic changes.  No evidence of right heart strain   RADIOLOGY Chest x-ray interpreted by me, unremarkable.  No lobar consolidation edema or effusion.  Radiology report reviewed   PROCEDURES:  Procedures   MEDICATIONS ORDERED IN ED: Medications  ketorolac (TORADOL) 15 MG/ML  injection 15 mg (15 mg Intravenous Given 03/10/23 1208)  methylPREDNISolone sodium succinate (SOLU-MEDROL) 125 mg/2 mL injection 125 mg (125 mg Intravenous Given 03/10/23 1208)  ipratropium-albuterol (DUONEB) 0.5-2.5 (3) MG/3ML nebulizer solution 9 mL (9 mLs Nebulization Given 03/10/23 1202)  magnesium sulfate IVPB 2 g 50 mL (0 g Intravenous Stopped 03/10/23 1346)  albuterol (PROVENTIL) (2.5 MG/3ML) 0.083% nebulizer solution 5 mg (5 mg Nebulization Given 03/10/23 1355)     IMPRESSION / MDM / ASSESSMENT AND PLAN / ED COURSE  I reviewed the triage vital signs and the nursing notes.  DDx: COPD exacerbation, influenza, COVID, electrolyte derangement  Patient's presentation is most consistent with acute presentation with potential threat to life or bodily function.  Patient presents with cough and shortness of breath, found to have pronounced wheezing.  Took 500 mg azithromycin already today as prescribed by urgent care.  She is nontoxic, normal vital signs, low suspicion of ACS PE dissection  pericardial effusion.  Will give steroids magnesium bronchodilators and Toradol.   Clinical Course as of 03/10/23 1512  Thu Mar 10, 2023  1335 Symptoms improved.  Expiratory phase normalized with some mild residual end expiratory wheezing.  Has RSV bronchitis.  Vital signs are normal, oxygenation is normal.  Has azithromycin and albuterol that were recently prescribed.  Will add on prednisone, stable for discharge [PS]    Clinical Course User Index [PS] Sharman Cheek, MD   ----------------------------------------- 3:12 PM on 03/10/2023 ----------------------------------------- Case d/w hospitalist  FINAL CLINICAL IMPRESSION(S) / ED DIAGNOSES   Final diagnoses:  Acute bronchitis due to respiratory syncytial virus (RSV)  Acute respiratory failure with hypoxia (HCC)     Rx / DC Orders   ED Discharge Orders          Ordered    predniSONE (DELTASONE) 20 MG tablet  Daily with breakfast        03/10/23 1334    benzonatate (TESSALON PERLES) 100 MG capsule  3 times daily PRN        03/10/23 1334             Note:  This document was prepared using Dragon voice recognition software and may include unintentional dictation errors.   Sharman Cheek, MD 03/10/23 1428    Sharman Cheek, MD 03/10/23 [phone]

## 2023-03-10 NOTE — Progress Notes (Signed)
  Progress Note  CT personally reviewed. No evidence of opacities or nodules, potentially some mild traction bronchiectasis but will await official read. However, esophagus is extremely dilated up to 5 cm concerning for obstruction vs dysmotility. This places her at high risk for aspiration, and wonder if silent micro-aspiration may be contributing to her pulmonary symptoms.   - Modified barium swallow ordered   Author: Verdene Lennert, MD 03/10/2023 7:11 PM  For on call review [email or web address].

## 2023-03-10 NOTE — ED Notes (Signed)
 Notified CCMD of pt room change at this time.

## 2023-03-10 NOTE — ED Triage Notes (Signed)
 Cough, congestion, SOB with exertion, headache, fatigue, runny nose x 1 month. Pt was seen 2/16 and took prescribed medication but still having symptoms.

## 2023-03-10 NOTE — Assessment & Plan Note (Addendum)
 Patient is presenting with recent recurrence of cough/shortness of breath/wheezing that began approximately 3-4 days ago, with RSV PCR positive.  Patient's oxygen saturation dipped to 87% on ambulation.  Given concern for possible COPD, will continue with acute COPD exacerbation protocol.  - Continuous pulse oximetry - Continue supplemental oxygen as needed to maintain oxygen saturation above 88% - Supportive management with Tylenol, Tussionex, guaifenesin - DuoNebs every 6 hours - S/p Solu-Medrol 125 mg once - Start prednisone 40 mg daily tomorrow

## 2023-03-10 NOTE — Discharge Instructions (Addendum)
 Your chest x-ray is pending.    Take the Zithromax as directed.    Follow up with your primary care provider tomorrow.  Go to the emergency department if you have worsening symptoms.

## 2023-03-10 NOTE — ED Notes (Signed)
 Attempted to get an ambulatory O2 saturation at this time. Pt felt dizzy after walking for about 60 seconds. Pt sat back down and started coughing. When a good wave form appeared pt's O2 saturation 97%. MD notified.

## 2023-03-10 NOTE — Assessment & Plan Note (Signed)
 Suspected OSA with home study ordered.

## 2023-03-11 ENCOUNTER — Other Ambulatory Visit: Payer: Self-pay

## 2023-03-11 DIAGNOSIS — J205 Acute bronchitis due to respiratory syncytial virus: Secondary | ICD-10-CM | POA: Diagnosis not present

## 2023-03-11 DIAGNOSIS — E119 Type 2 diabetes mellitus without complications: Secondary | ICD-10-CM

## 2023-03-11 LAB — RESPIRATORY PANEL BY PCR
Adenovirus: NOT DETECTED
Bordetella Parapertussis: NOT DETECTED
Bordetella pertussis: NOT DETECTED
Chlamydophila pneumoniae: NOT DETECTED
Coronavirus 229E: NOT DETECTED
Coronavirus HKU1: NOT DETECTED
Coronavirus NL63: NOT DETECTED
Coronavirus OC43: NOT DETECTED
Influenza A: NOT DETECTED
Influenza B: NOT DETECTED
Metapneumovirus: DETECTED — AB
Mycoplasma pneumoniae: NOT DETECTED
Parainfluenza Virus 1: NOT DETECTED
Parainfluenza Virus 2: NOT DETECTED
Parainfluenza Virus 3: NOT DETECTED
Parainfluenza Virus 4: NOT DETECTED
Respiratory Syncytial Virus: DETECTED — AB
Rhinovirus / Enterovirus: NOT DETECTED

## 2023-03-11 LAB — HIV ANTIBODY (ROUTINE TESTING W REFLEX): HIV Screen 4th Generation wRfx: NONREACTIVE

## 2023-03-11 LAB — CBC
HCT: 42 % (ref 36.0–46.0)
Hemoglobin: 14.2 g/dL (ref 12.0–15.0)
MCH: 29.5 pg (ref 26.0–34.0)
MCHC: 33.8 g/dL (ref 30.0–36.0)
MCV: 87.3 fL (ref 80.0–100.0)
Platelets: 183 10*3/uL (ref 150–400)
RBC: 4.81 MIL/uL (ref 3.87–5.11)
RDW: 12.7 % (ref 11.5–15.5)
WBC: 11 10*3/uL — ABNORMAL HIGH (ref 4.0–10.5)
nRBC: 0 % (ref 0.0–0.2)

## 2023-03-11 LAB — BASIC METABOLIC PANEL
Anion gap: 11 (ref 5–15)
BUN: 19 mg/dL (ref 8–23)
CO2: 21 mmol/L — ABNORMAL LOW (ref 22–32)
Calcium: 8.8 mg/dL — ABNORMAL LOW (ref 8.9–10.3)
Chloride: 101 mmol/L (ref 98–111)
Creatinine, Ser: 0.82 mg/dL (ref 0.44–1.00)
GFR, Estimated: >60 mL/min (ref >60)
Glucose, Bld: 400 mg/dL — ABNORMAL HIGH (ref 70–99)
Potassium: 4.3 mmol/L (ref 3.5–5.1)
Sodium: 133 mmol/L — ABNORMAL LOW (ref 135–145)

## 2023-03-11 LAB — GLUCOSE, CAPILLARY
Glucose-Capillary: 382 mg/dL — ABNORMAL HIGH (ref 70–99)
Glucose-Capillary: 340 mg/dL — ABNORMAL HIGH (ref 70–99)
Glucose-Capillary: 272 mg/dL — ABNORMAL HIGH (ref 70–99)
Glucose-Capillary: 275 mg/dL — ABNORMAL HIGH (ref 70–99)

## 2023-03-11 MED ORDER — INSULIN ASPART 100 UNIT/ML IJ SOLN
0.0000 [IU] | Freq: Three times a day (TID) | INTRAMUSCULAR | Status: DC
  Administered 2023-03-11: 11 [IU] via SUBCUTANEOUS
  Administered 2023-03-11: 8 [IU] via SUBCUTANEOUS
  Administered 2023-03-12 (×2): 2 [IU] via SUBCUTANEOUS
  Administered 2023-03-12: 15 [IU] via SUBCUTANEOUS
  Administered 2023-03-13: 2 [IU] via SUBCUTANEOUS
  Administered 2023-03-13: 3 [IU] via SUBCUTANEOUS
  Administered 2023-03-13: 15 [IU] via SUBCUTANEOUS
  Administered 2023-03-14: 8 [IU] via SUBCUTANEOUS
  Administered 2023-03-14: 3 [IU] via SUBCUTANEOUS
  Administered 2023-03-15: 15 [IU] via SUBCUTANEOUS
  Administered 2023-03-15: 3 [IU] via SUBCUTANEOUS
  Filled 2023-03-11 (×11): qty 1

## 2023-03-11 MED ORDER — AZITHROMYCIN 500 MG PO TABS
250.0000 mg | ORAL_TABLET | Freq: Every day | ORAL | Status: AC
  Administered 2023-03-11 – 2023-03-14 (×4): 250 mg via ORAL
  Filled 2023-03-11 (×4): qty 1

## 2023-03-11 MED ORDER — INSULIN ASPART 100 UNIT/ML IJ SOLN
0.0000 [IU] | Freq: Every day | INTRAMUSCULAR | Status: DC
  Administered 2023-03-11 – 2023-03-13 (×3): 3 [IU] via SUBCUTANEOUS
  Administered 2023-03-14: 2 [IU] via SUBCUTANEOUS
  Filled 2023-03-11 (×4): qty 1

## 2023-03-11 MED ORDER — IPRATROPIUM-ALBUTEROL 0.5-2.5 (3) MG/3ML IN SOLN
3.0000 mL | Freq: Three times a day (TID) | RESPIRATORY_TRACT | Status: DC
  Administered 2023-03-11 – 2023-03-15 (×13): 3 mL via RESPIRATORY_TRACT
  Filled 2023-03-11 (×14): qty 3

## 2023-03-11 MED ORDER — ALUM & MAG HYDROXIDE-SIMETH 200-200-20 MG/5ML PO SUSP
30.0000 mL | ORAL | Status: DC | PRN
Start: 2023-03-11 — End: 2023-03-15
  Administered 2023-03-11 – 2023-03-13 (×2): 30 mL via ORAL
  Filled 2023-03-11 (×3): qty 30

## 2023-03-11 NOTE — Evaluation (Signed)
 Clinical/Bedside Swallow Evaluation Patient Details  Name: Evelyn Shepherd MRN: [id number] Date of Birth: Mar 21, 1960  Today's Date: 03/11/2023 Time: SLP Start Time (ACUTE ONLY): 1100 SLP Stop Time (ACUTE ONLY): 1200 SLP Time Calculation (min) (ACUTE ONLY): 60 min  Past Medical History:  Past Medical History:  Diagnosis Date   History of depression    History of gastroesophageal reflux (GERD)    History of hyperlipidemia    History of migraine    History of obesity    lap band surgery   History of pneumonia 2005   Past Surgical History:  Past Surgical History:  Procedure Laterality Date   AUGMENTATION MAMMAPLASTY Bilateral 1991   BREAST ENHANCEMENT SURGERY     CESAREAN SECTION     DENTAL SURGERY     LAPAROSCOPIC GASTRIC BANDING  07/31/07   LUMBAR DISC SURGERY     TONGUE SURGERY  03/2005   lesion removal   HPI:  Pt is a 63 y.o. female with medical history significant of OSA, morbid Obesity with BMI of 40, lumbar radiculopathy, B12 deficiency, prediabetes, Bariatric surgery status who presents to the ED due to shortness of breath. Pt admitted w/ RSV bronchitis.  OF NOTE: pt endorses s/s of Esophageal phase Dysmotility w/ Dilation(2006?) and REFLUX behaviors.    CT of Chest: Marked distension of the thoracic esophagus with significant  retained debris within the esophageal lumen. This may be due to functional obstruction due to the gastric band in place, and  follow-up nonemergent outpatient esophagram may be useful for  further evaluation.  2. Small hiatal hernia. Lungs/Pleura: No acute airspace disease, effusion, or pneumothorax.  Central airways are patent.    Assessment / Plan / Recommendation  Clinical Impression    Pt seen for BSE this morning. t awake, verbal and engaged appropriately in conversation w/ SLP, Husband present. A/O x4. Pt was able to describe h/o Esophageal phase Dysmotility and discomfort. Pt eager to learn any information to "get me better".  On [place] O2 support-  2L; afebrile, WBC not elevated.     OF NOTE: Pt endorses s/s of REFLUX at home; she does not take any OTC meds nor is on a PPI -- "I used to take something". She stated the Esophageal phase discomfort "bothers me during the night" and that it awakens her w/ "pain" at times. She endorsed hiccup/belching. CT of Chest: Marked distension of the thoracic esophagus with significant  retained debris within the esophageal lumen. This may be due to  functional obstruction due to the gastric band in place, and  follow-up nonemergent outpatient esophagram may be useful for  further evaluation.  2. Small hiatal hernia.". Pt has had lap band surgery in the past.    Pt appears to present w/ functional oropharyngeal phase swallowing w/ No overt oropharyngeal phase dysphagia appreciated during oral intake of trials; No neuromuscular swallowing deficits appreciated. Pt appears at reduced risk for aspiration/aspiration pneumonia from an oropharyngeal phase standpoint following general aspiration precautions.  HOWEVER, pt has a baseline presentation of REFLUX activity per her description, especially at night. She is not on a PPI. Noted the CT scan results this admit. ANY Esophageal phase Dysmotility or Regurgitation of Reflux material can increase risk for aspiration of the Reflux material during Retrograde flow thus impact Voicing and Pulmonary status.     Pt sat upright in bed and consumed several trials of thin liquids Via Cup(Pills w/ water also) and purees w/ No overt clinical s/s of aspiration noted; clear vocal quality b/t  trials, no cough, no decline in pulmonary status, no multiple swallows noted post initial pharyngeal swallow, no decline in O2 sats. Oral phase appeared [facility] for bolus management and timely A-P transfer/clearing of material. Mastication movements appeared appropriate. OM exam was [facility] for oral clearing; lingual/labial movements. No unilateral weakness. Speech clear.    Recommend continue a Regular diet  (moistened, cut-small foods) w/ thin liquids. General aspiration precautions. Rest Breaks during meals/oral intake to allow for Esophageal clearing. REFLUX precautions strongly recommended to lessen chance for Regurgitation(no carbonated drinks w/ meals, no straw use, etc); also HOB elevated at night when sleeping.  Consulted MD re: f/u w/ DG Esophageal, PPI.   Recommend pt f/u w/ GI for assessment/management of REFLUX and Esophageal phase Dysmotility; tx as indicated. Discussion and handouts given on REFLUX, impact of REFLUX on swallowing and breathing, PPI, behaviors to manage REFLUX, and foods/diet. MD to reconsult ST services if any new needs while admitted. NSG updated. Pt/Husband appreciative of Education information. SLP Visit Diagnosis: Dysphagia, unspecified (R13.10) (Esophageal phase Dysmotility)    Aspiration Risk  No limitations    Diet Recommendation   Thin;Age appropriate regular (cut small, moistened well) = continue a Regular diet (moistened, cut-small foods) w/ thin liquids. General aspiration precautions. Rest Breaks during meals/oral intake to allow for Esophageal clearing. REFLUX precautions strongly recommended to lessen chance for Regurgitation(no carbonated drinks w/ meals, no straw use, etc). Less breads and meats in the diet until seen by GI.   Medication Administration: Whole meds with liquid (vs in Puree)    Other  Recommendations Recommended Consults: Consider GI evaluation;Consider esophageal assessment Oral Care Recommendations: Oral care BID;Patient independent with oral care    Recommendations for follow up therapy are one component of a multi-disciplinary discharge planning process, led by the attending physician.  Recommendations may be updated based on patient status, additional functional criteria and insurance authorization.  Follow up Recommendations No SLP follow up      Assistance Recommended at Discharge  PRN  Functional Status Assessment Patient has  not had a recent decline in their functional status  Frequency and Duration  (n/a)   (n/a)       Prognosis Prognosis for improved oropharyngeal function: Good Barriers to Reach Goals: Time post onset;Severity of deficits Barriers/Prognosis Comment: Esophageal phase Dysmotility      Swallow Study   General Date of Onset: 03/10/23 HPI: Pt is a 63 y.o. female with medical history significant of OSA, morbid Obesity with BMI of 40, lumbar radiculopathy, B12 deficiency, prediabetes, Bariatric surgery status who presents to the ED due to shortness of breath. Pt admitted w/ RSV bronchitis.  OF NOTE: pt endorses s/s of Esophageal phase Dysmotility w/ Dilation(2006?) and REFLUX behaviors.   CT of Chest: Marked distension of the thoracic esophagus with significant  retained debris within the esophageal lumen. This may be due to  functional obstruction due to the gastric band in place, and  follow-up nonemergent outpatient esophagram may be useful for  further evaluation.  2. Small hiatal hernia. Lungs/Pleura: No acute airspace disease, effusion, or pneumothorax.  Central airways are patent. Type of Study: Bedside Swallow Evaluation Previous Swallow Assessment: none Diet Prior to this Study: Regular;Thin liquids (Level 0) Temperature Spikes Noted: No (wbc 11.0) Respiratory Status: Nasal cannula (2L) History of Recent Intubation: No Behavior/Cognition: Alert;Cooperative;Pleasant mood (x4) Oral Cavity Assessment: Within Functional Limits Oral Care Completed by SLP: Recent completion by staff Oral Cavity - Dentition: Adequate natural dentition Vision: Functional for self-feeding Self-Feeding Abilities: Able to  feed self Patient Positioning: Upright in bed Baseline Vocal Quality: Normal Volitional Cough: Strong Volitional Swallow: Able to elicit    Oral/Motor/Sensory Function Overall Oral Motor/Sensory Function: Within functional limits   Ice Chips Ice chips: Not tested   Thin Liquid Thin Liquid:  Within functional limits Presentation: Cup;Self Fed (multiple swallows; single swallows) Other Comments: w/ pill swallowing also    Nectar Thick Nectar Thick Liquid: Not tested   Honey Thick Honey Thick Liquid: Not tested   Puree Puree: Within functional limits Presentation: Self Fed;Spoon (~4 ozs)   Solid     Solid: Not tested Other Comments: declined problems        Jerilynn Som, MS, CCC-SLP Speech Language Pathologist Rehab Services; [facility] [phone] (ascom) Braeson Rupe 03/11/2023,2:21 PM

## 2023-03-11 NOTE — Plan of Care (Signed)

## 2023-03-11 NOTE — Progress Notes (Signed)
 PROGRESS NOTE    Evelyn Shepherd  RUE:[id number] DOB: [birth year] DOA: 03/10/2023 PCP: Judy Pimple, MD  Outpatient Specialists: pulm    Brief Narrative:   From admission h and p  Evelyn Shepherd is a 63 y.o. female with medical history significant of OSA, morbid obesity with BMI of 40, lumbar radiculopathy, B12 deficiency, prediabetes, who presents to the ED due to shortness of breath.   Evelyn Shepherd states that for the last 1 month, she has been having a nonproductive cough and shortness of breath with wheezing.  She was seen at an urgent care and started on albuterol and prednisone.  She states that the prednisone seemed to help her breathing the most and she completed the taper on 2/20.  Then within 24-48 hours, she began to experience wheezing/shortness of breath again.  She states that this time it is a bit different as she is also experiencing hot and cold chills, headache.  She denies any chest pain or palpitations.  She notes that her husband has also been feeling unwell for the last few weeks.  She recently established with pulmonology and she has ordered Evelyn Shepherd a home sleep study, but she has not received it yet.  She endorses daily vaping but is willing to quit.  Assessment & Plan:   Principal Problem:   RSV bronchitis Active Problems:   Shortness of breath   OSA (obstructive sleep apnea)   Prediabetes   Hypothyroidism   Hyperlipidemia   OBESITY, MORBID   Depression   Bariatric surgery status   Chronic obstructive pulmonary disease (HCC)   Radiculopathy   T2DM (type 2 diabetes mellitus) (HCC)  # RSV bronchitis CT no acute findings. Possible baseline copd - continue steroids - continue azithromycin for anti-inflammatory benefit given possible underlying copd - continue breathing treatments - f/u rvp  # Acute hypoxic respiratory failure 80s in the ER - continue [place] o2, wean as able  # Elevated A1c Hx one prior, here 7.1 but has been on steroids recently. Glucose  markedly elevated this morning - SSI for now  # History bariatric surgery # Distention of thoracic esophagus Will ask GI to review  # OSA - cpap at bedtime  # GAD - home lexapro, gabapentin  # Obesity Noted   DVT prophylaxis: lovenox Code Status: full Family Communication: none at bedside  Level of care: Med-Surg Status is: Observation    Consultants:  none  Procedures: None   Antimicrobials:  azithromycin    Subjective: Reports some improvement in breathing but still cough and sob  Objective: Vitals:   03/11/23 0150 03/11/23 0407 03/11/23 0739 03/11/23 0852  BP:  132/85  116/75  Pulse:  95  (!) 102  Resp:  18  20  Temp:  97.8 F (36.6 C)  97.7 F (36.5 C)  TempSrc:  Oral  Oral  SpO2: 96% 97% 96% 93%  Weight:      Height:        Intake/Output Summary (Last 24 hours) at 03/11/2023 1019 Last data filed at 03/10/2023 1928 Gross per 24 hour  Intake 530 ml  Output --  Net 530 ml   Filed Weights   03/10/23 1102  Weight: 108.9 kg    Examination:  General exam: Appears calm and comfortable  Respiratory system: rhonchi, faint exp wheeze Cardiovascular system: S1 & S2 heard, RRR.  Gastrointestinal system: Abdomen is nondistended, soft and nontender. No organomegaly or masses felt. Normal bowel sounds heard. Central nervous system: Alert and oriented.  No focal neurological deficits. Extremities: Symmetric 5 x 5 power. Skin: No rashes, lesions or ulcers Psychiatry: Judgement and insight appear normal. Mood & affect appropriate.     Data Reviewed: I have personally reviewed following labs and imaging studies  CBC: Recent Labs  Lab 03/10/23 1104 03/11/23 0416  WBC 6.2 11.0*  HGB 13.6 14.2  HCT 41.3 42.0  MCV 91.2 87.3  PLT 147* 183   Basic Metabolic Panel: Recent Labs  Lab 03/10/23 1104 03/11/23 0416  NA 133* 133*  K 3.7 4.3  CL 100 101  CO2 24 21*  GLUCOSE 198* 400*  BUN 22 19  CREATININE 0.86 0.82  CALCIUM 8.6* 8.8*    GFR: Estimated Creatinine Clearance: 87.4 mL/min (by C-G formula based on SCr of 0.82 mg/dL). Liver Function Tests: No results for input(s): "AST", "ALT", "ALKPHOS", "BILITOT", "PROT", "ALBUMIN" in the last 168 hours. No results for input(s): "LIPASE", "AMYLASE" in the last 168 hours. No results for input(s): "AMMONIA" in the last 168 hours. Coagulation Profile: No results for input(s): "INR", "PROTIME" in the last 168 hours. Cardiac Enzymes: No results for input(s): "CKTOTAL", "CKMB", "CKMBINDEX", "TROPONINI" in the last 168 hours. BNP (last 3 results) No results for input(s): "PROBNP" in the last 8760 hours. HbA1C: Recent Labs    03/10/23 1104  HGBA1C 7.1*   CBG: Recent Labs  Lab 03/11/23 1016  GLUCAP 382*   Lipid Profile: No results for input(s): "CHOL", "HDL", "LDLCALC", "TRIG", "CHOLHDL", "LDLDIRECT" in the last 72 hours. Thyroid Function Tests: No results for input(s): "TSH", "T4TOTAL", "FREET4", "T3FREE", "THYROIDAB" in the last 72 hours. Anemia Panel: No results for input(s): "VITAMINB12", "FOLATE", "FERRITIN", "TIBC", "IRON", "RETICCTPCT" in the last 72 hours. Urine analysis:    Component Value Date/Time   COLORURINE YELLOW (A) 09/25/2018 2014   APPEARANCEUR CLOUDY (A) 09/25/2018 2014   APPEARANCEUR Clear 09/20/2017 1349   LABSPEC 1.018 09/25/2018 2014   PHURINE 7.0 09/25/2018 2014   GLUCOSEU NEGATIVE 09/25/2018 2014   HGBUR LARGE (A) 09/25/2018 2014   BILIRUBINUR Negative 01/30/2021 0915   BILIRUBINUR Negative 09/20/2017 1349   KETONESUR NEGATIVE 09/25/2018 2014   PROTEINUR Negative 01/30/2021 0915   PROTEINUR 100 (A) 09/25/2018 2014   UROBILINOGEN 0.2 01/30/2021 0915   UROBILINOGEN 0.2 03/16/2013 1819   NITRITE Negative 01/30/2021 0915   NITRITE POSITIVE (A) 09/25/2018 2014   LEUKOCYTESUR Negative 01/30/2021 0915   LEUKOCYTESUR MODERATE (A) 09/25/2018 2014   Sepsis Labs: @LABRCNTIP (procalcitonin:4,lacticidven:4)  ) Recent Results (from the past  240 hours)  Resp panel by RT-PCR (RSV, Flu A&B, Covid) Anterior Nasal Swab     Status: Abnormal   Collection Time: 03/10/23 12:13 PM   Specimen: Anterior Nasal Swab  Result Value Ref Range Status   SARS Coronavirus 2 by RT PCR NEGATIVE NEGATIVE Final    Comment: (NOTE) SARS-CoV-2 target nucleic acids are NOT DETECTED.  The SARS-CoV-2 RNA is generally detectable in upper respiratory specimens during the acute phase of infection. The lowest concentration of SARS-CoV-2 viral copies this assay can detect is 138 copies/mL. A negative result does not preclude SARS-Cov-2 infection and should not be used as the sole basis for treatment or other patient management decisions. A negative result may occur with  improper specimen collection/handling, submission of specimen other than nasopharyngeal swab, presence of viral mutation(s) within the areas targeted by this assay, and inadequate number of viral copies(<138 copies/mL). A negative result must be combined with clinical observations, patient history, and epidemiological information. The expected result is Negative.  Fact Sheet  for Patients:  BloggerCourse.com  Fact Sheet for Healthcare Providers:  SeriousBroker.it  This test is no t yet approved or cleared by the [place] FDA and  has been authorized for detection and/or diagnosis of SARS-CoV-2 by FDA under an Emergency Use Authorization (EUA). This EUA will remain  in effect (meaning this test can be used) for the duration of the COVID-19 declaration under Section 564(b)(1) of the Act, 21 U.S.C.section 360bbb-3(b)(1), unless the authorization is terminated  or revoked sooner.       Influenza A by PCR NEGATIVE NEGATIVE Final   Influenza B by PCR NEGATIVE NEGATIVE Final    Comment: (NOTE) The Xpert Xpress SARS-CoV-2/FLU/RSV plus assay is intended as an aid in the diagnosis of influenza from Nasopharyngeal swab specimens  and should not be used as a sole basis for treatment. Nasal washings and aspirates are unacceptable for Xpert Xpress SARS-CoV-2/FLU/RSV testing.  Fact Sheet for Patients: BloggerCourse.com  Fact Sheet for Healthcare Providers: SeriousBroker.it  This test is not yet approved or cleared by the [place] FDA and has been authorized for detection and/or diagnosis of SARS-CoV-2 by FDA under an Emergency Use Authorization (EUA). This EUA will remain in effect (meaning this test can be used) for the duration of the COVID-19 declaration under Section 564(b)(1) of the Act, 21 U.S.C. section 360bbb-3(b)(1), unless the authorization is terminated or revoked.     Resp Syncytial Virus by PCR POSITIVE (A) NEGATIVE Final    Comment: (NOTE) Fact Sheet for Patients: BloggerCourse.com  Fact Sheet for Healthcare Providers: SeriousBroker.it  This test is not yet approved or cleared by the [place] FDA and has been authorized for detection and/or diagnosis of SARS-CoV-2 by FDA under an Emergency Use Authorization (EUA). This EUA will remain in effect (meaning this test can be used) for the duration of the COVID-19 declaration under Section 564(b)(1) of the Act, 21 U.S.C. section 360bbb-3(b)(1), unless the authorization is terminated or revoked.  Performed at [facility], [address] [place], [place] [address]          Radiology Studies: CT CHEST WO CONTRAST Result Date: 03/10/2023 CLINICAL DATA:  Short of breath, cough and chills, body aches EXAM: CT CHEST WITHOUT CONTRAST TECHNIQUE: Multidetector CT imaging of the chest was performed following the standard protocol without IV contrast. RADIATION DOSE REDUCTION: This exam was performed according to the departmental dose-optimization program which includes automated exposure control, adjustment of the mA and/or kV  according to patient size and/or use of iterative reconstruction technique. COMPARISON:  03/10/2023 FINDINGS: Cardiovascular: Unenhanced imaging of the heart is unremarkable without pericardial effusion. Normal caliber of the thoracic aorta. Evaluation of the vascular lumen is limited without IV contrast. Mediastinum/Nodes: There is diffuse distension of the thoracic esophagus, with marked retained debris within the esophageal lumen. Postsurgical changes are seen from prior gastric band procedure, with small hiatal hernia. Given the distension and debris within the esophagus, there may be functional obstruction at the gastric band site. Further evaluation with nonemergent outpatient esophagram may be useful. The thyroid and trachea are unremarkable.  No pathologic adenopathy. Lungs/Pleura: No acute airspace disease, effusion, or pneumothorax. Central airways are patent. Upper Abdomen: Gastric band as described above. No other acute upper abdominal findings. Musculoskeletal: No acute or destructive bony abnormalities. Reconstructed images demonstrate no additional findings. IMPRESSION: 1. Marked distension of the thoracic esophagus with significant retained debris within the esophageal lumen. This may be due to functional obstruction due to the gastric band in place, and follow-up nonemergent  outpatient esophagram may be useful for further evaluation. 2. Small hiatal hernia. 3. No acute intrathoracic process. Electronically Signed   By: Sharlet Salina M.D.   On: 03/10/2023 20:12   DG Chest 2 View Result Date: 03/10/2023 CLINICAL DATA:  Cough, dyspnea with exertion. EXAM: CHEST - 2 VIEW COMPARISON:  Same day. FINDINGS: The heart size and mediastinal contours are within normal limits. Both lungs are clear. The visualized skeletal structures are unremarkable. IMPRESSION: No active cardiopulmonary disease. Electronically Signed   By: Lupita Raider M.D.   On: 03/10/2023 12:22   DG Chest 2 View Result Date:  03/10/2023 CLINICAL DATA:  Cough for 1 month, dyspnea with exertion. EXAM: CHEST - 2 VIEW COMPARISON:  December 16, 2020. FINDINGS: The heart size and mediastinal contours are within normal limits. Both lungs are clear. The visualized skeletal structures are unremarkable. IMPRESSION: No active cardiopulmonary disease. Electronically Signed   By: Lupita Raider M.D.   On: 03/10/2023 12:20        Scheduled Meds:  enoxaparin (LOVENOX) injection  0.5 mg/kg Subcutaneous Q24H   escitalopram  20 mg Oral Daily   insulin aspart  0-15 Units Subcutaneous TID WC   insulin aspart  0-5 Units Subcutaneous QHS   ipratropium-albuterol  3 mL Nebulization Q6H   predniSONE  40 mg Oral Q breakfast   pregabalin  25 mg Oral BID   rosuvastatin  10 mg Oral Daily   sodium chloride flush  3 mL Intravenous Q12H   Continuous Infusions:   LOS: 0 days     Silvano Bilis, MD [facility]   If 7PM-7AM, please contact night-coverage [email or web address] Password [facility] 03/11/2023, 10:19 AM

## 2023-03-11 NOTE — Inpatient Diabetes Management (Addendum)
 Inpatient Diabetes Program Recommendations  AACE/ADA: New Consensus Statement on Inpatient Glycemic Control (2015)  Target Ranges:  Prepandial:   less than 140 mg/dL      Peak postprandial:   less than 180 mg/dL (1-2 hours)      Critically ill patients:  140 - 180 mg/dL   Lab Results  Component Value Date   GLUCAP 187 (H) 08/01/2007   HGBA1C 7.1 (H) 03/10/2023    Review of Glycemic Control  Latest Reference Range & Units 03/10/23 11:04 03/11/23 04:16  Glucose 70 - 99 mg/dL 604 (H) 540 (H)  (H): Data is abnormally high Diabetes history: preDM vs Type 2DM? Outpatient Diabetes medications: none; Prednisone taper Current orders for Inpatient glycemic control: Novolog 0-15 units TID & HS Solumedrol 125 mg x1, tapered to Prednisone 40 mg QD  Inpatient Diabetes Program Recommendations:    In the setting of steroids: Consider adding Novolog 4 units TID (assuming patient consuming >50% of meals). Will plan to see.  Secure chat sent to MD with recs and noted A1C. New onset ? However, patient has been on Prednisone taper.   Addendum: Spoke with patient regarding PreDM vs possible new onset DM vs steroid induced diabetes.  Reviewed patient's current A1c of 7.1%. Explained what a A1c is and what it measures. Also reviewed goal A1c with patient, importance of good glucose control @ home, and blood sugar goals. Reviewed patho of DM, role of pancreas, survival skills, interventions, importance of follow up, vascular changes, risk for infection, impact of steroids, importance of being mindful of CHO intake, and other commorbidities.  Patient has a meter. Reviewed recommended frequency. Patient has a planned visit with PCP next Friday.  Denies drinking sugary beverages. Reviewed alternatives and importance of protein.  Patient's mother has diabetes, thus patient is familiar with insulin pen and sliding scale. Briefly reviewed. No additional questions.    Thanks, Lujean Rave, MSN,  R[facility] Diabetes Coordinator [phone] (8a-5p)

## 2023-03-12 DIAGNOSIS — Z8249 Family history of ischemic heart disease and other diseases of the circulatory system: Secondary | ICD-10-CM | POA: Diagnosis not present

## 2023-03-12 DIAGNOSIS — R0602 Shortness of breath: Secondary | ICD-10-CM | POA: Diagnosis present

## 2023-03-12 DIAGNOSIS — Z79899 Other long term (current) drug therapy: Secondary | ICD-10-CM | POA: Diagnosis not present

## 2023-03-12 DIAGNOSIS — J9601 Acute respiratory failure with hypoxia: Secondary | ICD-10-CM | POA: Diagnosis present

## 2023-03-12 DIAGNOSIS — Z6841 Body Mass Index (BMI) 40.0 and over, adult: Secondary | ICD-10-CM | POA: Diagnosis not present

## 2023-03-12 DIAGNOSIS — G4733 Obstructive sleep apnea (adult) (pediatric): Secondary | ICD-10-CM | POA: Diagnosis present

## 2023-03-12 DIAGNOSIS — J440 Chronic obstructive pulmonary disease with acute lower respiratory infection: Secondary | ICD-10-CM | POA: Diagnosis present

## 2023-03-12 DIAGNOSIS — Z9884 Bariatric surgery status: Secondary | ICD-10-CM | POA: Diagnosis not present

## 2023-03-12 DIAGNOSIS — E039 Hypothyroidism, unspecified: Secondary | ICD-10-CM | POA: Diagnosis present

## 2023-03-12 DIAGNOSIS — J211 Acute bronchiolitis due to human metapneumovirus: Secondary | ICD-10-CM | POA: Diagnosis present

## 2023-03-12 DIAGNOSIS — T380X5A Adverse effect of glucocorticoids and synthetic analogues, initial encounter: Secondary | ICD-10-CM | POA: Diagnosis present

## 2023-03-12 DIAGNOSIS — E785 Hyperlipidemia, unspecified: Secondary | ICD-10-CM | POA: Diagnosis present

## 2023-03-12 DIAGNOSIS — M549 Dorsalgia, unspecified: Secondary | ICD-10-CM | POA: Diagnosis present

## 2023-03-12 DIAGNOSIS — E1165 Type 2 diabetes mellitus with hyperglycemia: Secondary | ICD-10-CM | POA: Diagnosis present

## 2023-03-12 DIAGNOSIS — F17290 Nicotine dependence, other tobacco product, uncomplicated: Secondary | ICD-10-CM | POA: Diagnosis present

## 2023-03-12 DIAGNOSIS — Z1152 Encounter for screening for COVID-19: Secondary | ICD-10-CM | POA: Diagnosis not present

## 2023-03-12 DIAGNOSIS — Z833 Family history of diabetes mellitus: Secondary | ICD-10-CM | POA: Diagnosis not present

## 2023-03-12 DIAGNOSIS — M5410 Radiculopathy, site unspecified: Secondary | ICD-10-CM | POA: Diagnosis present

## 2023-03-12 DIAGNOSIS — J205 Acute bronchitis due to respiratory syncytial virus: Secondary | ICD-10-CM | POA: Diagnosis present

## 2023-03-12 DIAGNOSIS — K219 Gastro-esophageal reflux disease without esophagitis: Secondary | ICD-10-CM | POA: Diagnosis present

## 2023-03-12 DIAGNOSIS — F32A Depression, unspecified: Secondary | ICD-10-CM | POA: Diagnosis present

## 2023-03-12 DIAGNOSIS — F411 Generalized anxiety disorder: Secondary | ICD-10-CM | POA: Diagnosis present

## 2023-03-12 LAB — GLUCOSE, CAPILLARY
Glucose-Capillary: 160 mg/dL — ABNORMAL HIGH (ref 70–99)
Glucose-Capillary: 175 mg/dL — ABNORMAL HIGH (ref 70–99)
Glucose-Capillary: 230 mg/dL — ABNORMAL HIGH (ref 70–99)
Glucose-Capillary: 264 mg/dL — ABNORMAL HIGH (ref 70–99)
Glucose-Capillary: 319 mg/dL — ABNORMAL HIGH (ref 70–99)
Glucose-Capillary: 346 mg/dL — ABNORMAL HIGH (ref 70–99)
Glucose-Capillary: 351 mg/dL — ABNORMAL HIGH (ref 70–99)

## 2023-03-12 LAB — BASIC METABOLIC PANEL
Anion gap: 6 (ref 5–15)
BUN: 27 mg/dL — ABNORMAL HIGH (ref 8–23)
CO2: 27 mmol/L (ref 22–32)
Calcium: 8.4 mg/dL — ABNORMAL LOW (ref 8.9–10.3)
Chloride: 106 mmol/L (ref 98–111)
Creatinine, Ser: 0.75 mg/dL (ref 0.44–1.00)
GFR, Estimated: >60 mL/min (ref >60)
Glucose, Bld: 262 mg/dL — ABNORMAL HIGH (ref 70–99)
Potassium: 4.4 mmol/L (ref 3.5–5.1)
Sodium: 139 mmol/L (ref 135–145)

## 2023-03-12 NOTE — Plan of Care (Signed)
 Pt has cough with come dyspnea with exertion. Pt on Droplet precautions for Flu A. Pt receiving Azithromycin PO and prednisone PO.  Problem: Education: Goal: Knowledge of General Education information will improve Description: Including pain rating scale, medication(s)/side effects and non-pharmacologic comfort measures Outcome: Progressing   Problem: Health Behavior/Discharge Planning: Goal: Ability to manage health-related needs will improve Outcome: Progressing   Problem: Clinical Measurements: Goal: Ability to maintain clinical measurements within normal limits will improve Outcome: Progressing Goal: Will remain free from infection Outcome: Progressing Goal: Diagnostic test results will improve Outcome: Progressing Goal: Respiratory complications will improve Outcome: Progressing Goal: Cardiovascular complication will be avoided Outcome: Progressing   Problem: Activity: Goal: Risk for activity intolerance will decrease Outcome: Progressing   Problem: Nutrition: Goal: Adequate nutrition will be maintained Outcome: Progressing   Problem: Coping: Goal: Level of anxiety will decrease Outcome: Progressing   Problem: Elimination: Goal: Will not experience complications related to bowel motility Outcome: Progressing Goal: Will not experience complications related to urinary retention Outcome: Progressing   Problem: Pain Managment: Goal: General experience of comfort will improve and/or be controlled Outcome: Progressing   Problem: Safety: Goal: Ability to remain free from injury will improve Outcome: Progressing   Problem: Skin Integrity: Goal: Risk for impaired skin integrity will decrease Outcome: Progressing   Problem: Education: Goal: Ability to describe self-care measures that may prevent or decrease complications (Diabetes Survival Skills Education) will improve Outcome: Progressing Goal: Individualized Educational Video(s) Outcome: Progressing   Problem:  Coping: Goal: Ability to adjust to condition or change in health will improve Outcome: Progressing   Problem: Fluid Volume: Goal: Ability to maintain a balanced intake and output will improve Outcome: Progressing   Problem: Health Behavior/Discharge Planning: Goal: Ability to identify and utilize available resources and services will improve Outcome: Progressing Goal: Ability to manage health-related needs will improve Outcome: Progressing   Problem: Metabolic: Goal: Ability to maintain appropriate glucose levels will improve Outcome: Progressing   Problem: Nutritional: Goal: Maintenance of adequate nutrition will improve Outcome: Progressing Goal: Progress toward achieving an optimal weight will improve Outcome: Progressing   Problem: Skin Integrity: Goal: Risk for impaired skin integrity will decrease Outcome: Progressing   Problem: Tissue Perfusion: Goal: Adequacy of tissue perfusion will improve Outcome: Progressing

## 2023-03-12 NOTE — Plan of Care (Signed)

## 2023-03-12 NOTE — Progress Notes (Signed)
 PROGRESS NOTE    Evelyn Shepherd  ZOX:[id number] DOB: [birth year] DOA: 03/10/2023 PCP: Judy Pimple, MD  Outpatient Specialists: pulm    Brief Narrative:   From admission h and p  Evelyn Shepherd is a 63 y.o. female with medical history significant of OSA, morbid obesity with BMI of 40, lumbar radiculopathy, B12 deficiency, prediabetes, who presents to the ED due to shortness of breath.   Evelyn Shepherd states that for the last 1 month, she has been having a nonproductive cough and shortness of breath with wheezing.  She was seen at an urgent care and started on albuterol and prednisone.  She states that the prednisone seemed to help her breathing the most and she completed the taper on 2/20.  Then within 24-48 hours, she began to experience wheezing/shortness of breath again.  She states that this time it is a bit different as she is also experiencing hot and cold chills, headache.  She denies any chest pain or palpitations.  She notes that her husband has also been feeling unwell for the last few weeks.  She recently established with pulmonology and she has ordered Evelyn Shepherd a home sleep study, but she has not received it yet.  She endorses daily vaping but is willing to quit.  Assessment & Plan:   Principal Problem:   RSV bronchitis Active Problems:   Shortness of breath   OSA (obstructive sleep apnea)   Prediabetes   Hypothyroidism   Hyperlipidemia   OBESITY, MORBID   Depression   Bariatric surgery status   Chronic obstructive pulmonary disease (HCC)   Radiculopathy  # RSV bronchitis # Metapneumovirus bronchitis Non-con CT no acute findings. Possible baseline copd. Remains dyspneic - continue steroids - continue azithromycin for anti-inflammatory benefit given possible underlying copd - continue breathing treatments  # Acute hypoxic respiratory failure 80s in the ER, 88 today off oxygen, remains symptomatic - continue [place] o2, wean as able  # Elevated A1c Hx one prior, here  7.1 but has been on steroids recently. Glucose markedly elevated yesterday likely 2/2 steroids, improving today - SSI for now  # History bariatric surgery # Distention of thoracic esophagus GI advises endoscopy but best to wait until after recovers from current respiratory illness, provided no signs acute obstruction.  - outpt GI referral  # OSA - cpap at bedtime  # GAD - home lexapro, gabapentin  # Obesity Noted   DVT prophylaxis: lovenox Code Status: full Family Communication: husband updated @ bedside 3/1  Level of care: Med-Surg Status is: Observation    Consultants:  none  Procedures: None   Antimicrobials:  azithromycin    Subjective: Reports some improvement in breathing but still cough and sob  Objective: Vitals:   03/11/23 2026 03/12/23 0405 03/12/23 0718 03/12/23 0730  BP:  96/78  97/65  Pulse:  72  64  Resp:  16  16  Temp:  98.1 F (36.7 C)  (!) 97.2 F (36.2 C)  TempSrc:      SpO2: 96% 97% 97% 95%  Weight:      Height:        Intake/Output Summary (Last 24 hours) at 03/12/2023 1224 Last data filed at 03/12/2023 0900 Gross per 24 hour  Intake 360 ml  Output --  Net 360 ml   Filed Weights   03/10/23 1102  Weight: 108.9 kg    Examination:  General exam: Appears calm and comfortable  Respiratory system: rhonchi, faint exp wheeze Cardiovascular system: S1 & S2  heard, RRR.  Gastrointestinal system: Abdomen is nondistended, soft and nontender. No organomegaly or masses felt. Normal bowel sounds heard. Central nervous system: Alert and oriented. No focal neurological deficits. Extremities: Symmetric 5 x 5 power. Skin: No rashes, lesions or ulcers Psychiatry: Judgement and insight appear normal. Mood & affect appropriate.     Data Reviewed: I have personally reviewed following labs and imaging studies  CBC: Recent Labs  Lab 03/10/23 1104 03/11/23 0416  WBC 6.2 11.0*  HGB 13.6 14.2  HCT 41.3 42.0  MCV 91.2 87.3  PLT 147* 183    Basic Metabolic Panel: Recent Labs  Lab 03/10/23 1104 03/11/23 0416 03/12/23 0425  NA 133* 133* 139  K 3.7 4.3 4.4  CL 100 101 106  CO2 24 21* 27  GLUCOSE 198* 400* 262*  BUN 22 19 27*  CREATININE 0.86 0.82 0.75  CALCIUM 8.6* 8.8* 8.4*   GFR: Estimated Creatinine Clearance: 89.6 mL/min (by C-G formula based on SCr of 0.75 mg/dL). Liver Function Tests: No results for input(s): "AST", "ALT", "ALKPHOS", "BILITOT", "PROT", "ALBUMIN" in the last 168 hours. No results for input(s): "LIPASE", "AMYLASE" in the last 168 hours. No results for input(s): "AMMONIA" in the last 168 hours. Coagulation Profile: No results for input(s): "INR", "PROTIME" in the last 168 hours. Cardiac Enzymes: No results for input(s): "CKTOTAL", "CKMB", "CKMBINDEX", "TROPONINI" in the last 168 hours. BNP (last 3 results) No results for input(s): "PROBNP" in the last 8760 hours. HbA1C: Recent Labs    03/10/23 1104  HGBA1C 7.1*   CBG: Recent Labs  Lab 03/11/23 2016 03/12/23 0003 03/12/23 0558 03/12/23 0734 03/12/23 1139  GLUCAP 275* 319* 230* 175* 160*   Lipid Profile: No results for input(s): "CHOL", "HDL", "LDLCALC", "TRIG", "CHOLHDL", "LDLDIRECT" in the last 72 hours. Thyroid Function Tests: No results for input(s): "TSH", "T4TOTAL", "FREET4", "T3FREE", "THYROIDAB" in the last 72 hours. Anemia Panel: No results for input(s): "VITAMINB12", "FOLATE", "FERRITIN", "TIBC", "IRON", "RETICCTPCT" in the last 72 hours. Urine analysis:    Component Value Date/Time   COLORURINE YELLOW (A) 09/25/2018 2014   APPEARANCEUR CLOUDY (A) 09/25/2018 2014   APPEARANCEUR Clear 09/20/2017 1349   LABSPEC 1.018 09/25/2018 2014   PHURINE 7.0 09/25/2018 2014   GLUCOSEU NEGATIVE 09/25/2018 2014   HGBUR LARGE (A) 09/25/2018 2014   BILIRUBINUR Negative 01/30/2021 0915   BILIRUBINUR Negative 09/20/2017 1349   KETONESUR NEGATIVE 09/25/2018 2014   PROTEINUR Negative 01/30/2021 0915   PROTEINUR 100 (A) 09/25/2018  2014   UROBILINOGEN 0.2 01/30/2021 0915   UROBILINOGEN 0.2 03/16/2013 1819   NITRITE Negative 01/30/2021 0915   NITRITE POSITIVE (A) 09/25/2018 2014   LEUKOCYTESUR Negative 01/30/2021 0915   LEUKOCYTESUR MODERATE (A) 09/25/2018 2014   Sepsis Labs: @LABRCNTIP (procalcitonin:4,lacticidven:4)  ) Recent Results (from the past 240 hours)  Resp panel by RT-PCR (RSV, Flu A&B, Covid) Anterior Nasal Swab     Status: Abnormal   Collection Time: 03/10/23 12:13 PM   Specimen: Anterior Nasal Swab  Result Value Ref Range Status   SARS Coronavirus 2 by RT PCR NEGATIVE NEGATIVE Final    Comment: (NOTE) SARS-CoV-2 target nucleic acids are NOT DETECTED.  The SARS-CoV-2 RNA is generally detectable in upper respiratory specimens during the acute phase of infection. The lowest concentration of SARS-CoV-2 viral copies this assay can detect is 138 copies/mL. A negative result does not preclude SARS-Cov-2 infection and should not be used as the sole basis for treatment or other patient management decisions. A negative result may occur with  improper specimen  collection/handling, submission of specimen other than nasopharyngeal swab, presence of viral mutation(s) within the areas targeted by this assay, and inadequate number of viral copies(<138 copies/mL). A negative result must be combined with clinical observations, patient history, and epidemiological information. The expected result is Negative.  Fact Sheet for Patients:  BloggerCourse.com  Fact Sheet for Healthcare Providers:  SeriousBroker.it  This test is no t yet approved or cleared by the [place] FDA and  has been authorized for detection and/or diagnosis of SARS-CoV-2 by FDA under an Emergency Use Authorization (EUA). This EUA will remain  in effect (meaning this test can be used) for the duration of the COVID-19 declaration under Section 564(b)(1) of the Act, 21 U.S.C.section  360bbb-3(b)(1), unless the authorization is terminated  or revoked sooner.       Influenza A by PCR NEGATIVE NEGATIVE Final   Influenza B by PCR NEGATIVE NEGATIVE Final    Comment: (NOTE) The Xpert Xpress SARS-CoV-2/FLU/RSV plus assay is intended as an aid in the diagnosis of influenza from Nasopharyngeal swab specimens and should not be used as a sole basis for treatment. Nasal washings and aspirates are unacceptable for Xpert Xpress SARS-CoV-2/FLU/RSV testing.  Fact Sheet for Patients: BloggerCourse.com  Fact Sheet for Healthcare Providers: SeriousBroker.it  This test is not yet approved or cleared by the [place] FDA and has been authorized for detection and/or diagnosis of SARS-CoV-2 by FDA under an Emergency Use Authorization (EUA). This EUA will remain in effect (meaning this test can be used) for the duration of the COVID-19 declaration under Section 564(b)(1) of the Act, 21 U.S.C. section 360bbb-3(b)(1), unless the authorization is terminated or revoked.     Resp Syncytial Virus by PCR POSITIVE (A) NEGATIVE Final    Comment: (NOTE) Fact Sheet for Patients: BloggerCourse.com  Fact Sheet for Healthcare Providers: SeriousBroker.it  This test is not yet approved or cleared by the [place] FDA and has been authorized for detection and/or diagnosis of SARS-CoV-2 by FDA under an Emergency Use Authorization (EUA). This EUA will remain in effect (meaning this test can be used) for the duration of the COVID-19 declaration under Section 564(b)(1) of the Act, 21 U.S.C. section 360bbb-3(b)(1), unless the authorization is terminated or revoked.  Performed at [facility], [address] [place], [place] [address]   Respiratory (~20 pathogens) panel by PCR     Status: Abnormal   Collection Time: 03/11/23 10:50 AM   Specimen: Nasopharyngeal Swab; Respiratory   Result Value Ref Range Status   Adenovirus NOT DETECTED NOT DETECTED Final   Coronavirus 229E NOT DETECTED NOT DETECTED Final    Comment: (NOTE) The Coronavirus on the Respiratory Panel, DOES NOT test for the novel  Coronavirus (2019 nCoV)    Coronavirus HKU1 NOT DETECTED NOT DETECTED Final   Coronavirus NL63 NOT DETECTED NOT DETECTED Final   Coronavirus OC43 NOT DETECTED NOT DETECTED Final   Metapneumovirus DETECTED (A) NOT DETECTED Final   Rhinovirus / Enterovirus NOT DETECTED NOT DETECTED Final   Influenza A NOT DETECTED NOT DETECTED Final   Influenza B NOT DETECTED NOT DETECTED Final   Parainfluenza Virus 1 NOT DETECTED NOT DETECTED Final   Parainfluenza Virus 2 NOT DETECTED NOT DETECTED Final   Parainfluenza Virus 3 NOT DETECTED NOT DETECTED Final   Parainfluenza Virus 4 NOT DETECTED NOT DETECTED Final   Respiratory Syncytial Virus DETECTED (A) NOT DETECTED Final   Bordetella pertussis NOT DETECTED NOT DETECTED Final   Bordetella Parapertussis NOT DETECTED NOT DETECTED Final  Chlamydophila pneumoniae NOT DETECTED NOT DETECTED Final   Mycoplasma pneumoniae NOT DETECTED NOT DETECTED Final    Comment: Performed at [facility], [address] [place], [place] [address]         Radiology Studies: CT CHEST WO CONTRAST Result Date: 03/10/2023 CLINICAL DATA:  Short of breath, cough and chills, body aches EXAM: CT CHEST WITHOUT CONTRAST TECHNIQUE: Multidetector CT imaging of the chest was performed following the standard protocol without IV contrast. RADIATION DOSE REDUCTION: This exam was performed according to the departmental dose-optimization program which includes automated exposure control, adjustment of the mA and/or kV according to patient size and/or use of iterative reconstruction technique. COMPARISON:  03/10/2023 FINDINGS: Cardiovascular: Unenhanced imaging of the heart is unremarkable without pericardial effusion. Normal caliber of the thoracic aorta. Evaluation of  the vascular lumen is limited without IV contrast. Mediastinum/Nodes: There is diffuse distension of the thoracic esophagus, with marked retained debris within the esophageal lumen. Postsurgical changes are seen from prior gastric band procedure, with small hiatal hernia. Given the distension and debris within the esophagus, there may be functional obstruction at the gastric band site. Further evaluation with nonemergent outpatient esophagram may be useful. The thyroid and trachea are unremarkable.  No pathologic adenopathy. Lungs/Pleura: No acute airspace disease, effusion, or pneumothorax. Central airways are patent. Upper Abdomen: Gastric band as described above. No other acute upper abdominal findings. Musculoskeletal: No acute or destructive bony abnormalities. Reconstructed images demonstrate no additional findings. IMPRESSION: 1. Marked distension of the thoracic esophagus with significant retained debris within the esophageal lumen. This may be due to functional obstruction due to the gastric band in place, and follow-up nonemergent outpatient esophagram may be useful for further evaluation. 2. Small hiatal hernia. 3. No acute intrathoracic process. Electronically Signed   By: Sharlet Salina M.D.   On: 03/10/2023 20:12        Scheduled Meds:  azithromycin  250 mg Oral Daily   enoxaparin (LOVENOX) injection  0.5 mg/kg Subcutaneous Q24H   escitalopram  20 mg Oral Daily   insulin aspart  0-15 Units Subcutaneous TID WC   insulin aspart  0-5 Units Subcutaneous QHS   ipratropium-albuterol  3 mL Nebulization TID   predniSONE  40 mg Oral Q breakfast   pregabalin  25 mg Oral BID   rosuvastatin  10 mg Oral Daily   sodium chloride flush  3 mL Intravenous Q12H   Continuous Infusions:   LOS: 0 days     Silvano Bilis, MD [facility]   If 7PM-7AM, please contact night-coverage [email or web address] Password [facility] 03/12/2023, 12:24 PM

## 2023-03-13 DIAGNOSIS — J205 Acute bronchitis due to respiratory syncytial virus: Secondary | ICD-10-CM | POA: Diagnosis not present

## 2023-03-13 LAB — BASIC METABOLIC PANEL
Anion gap: 6 (ref 5–15)
BUN: 23 mg/dL (ref 8–23)
CO2: 28 mmol/L (ref 22–32)
Calcium: 8.4 mg/dL — ABNORMAL LOW (ref 8.9–10.3)
Chloride: 105 mmol/L (ref 98–111)
Creatinine, Ser: 0.72 mg/dL (ref 0.44–1.00)
GFR, Estimated: >60 mL/min (ref >60)
Glucose, Bld: 151 mg/dL — ABNORMAL HIGH (ref 70–99)
Potassium: 3.9 mmol/L (ref 3.5–5.1)
Sodium: 139 mmol/L (ref 135–145)

## 2023-03-13 LAB — CBC
HCT: 35.6 % — ABNORMAL LOW (ref 36.0–46.0)
Hemoglobin: 11.8 g/dL — ABNORMAL LOW (ref 12.0–15.0)
MCH: 29.6 pg (ref 26.0–34.0)
MCHC: 33.1 g/dL (ref 30.0–36.0)
MCV: 89.4 fL (ref 80.0–100.0)
Platelets: 152 10*3/uL (ref 150–400)
RBC: 3.98 MIL/uL (ref 3.87–5.11)
RDW: 12.6 % (ref 11.5–15.5)
WBC: 10.2 10*3/uL (ref 4.0–10.5)
nRBC: 0 % (ref 0.0–0.2)

## 2023-03-13 LAB — BRAIN NATRIURETIC PEPTIDE: B Natriuretic Peptide: 57.2 pg/mL (ref 0.0–100.0)

## 2023-03-13 LAB — GLUCOSE, CAPILLARY
Glucose-Capillary: 134 mg/dL — ABNORMAL HIGH (ref 70–99)
Glucose-Capillary: 164 mg/dL — ABNORMAL HIGH (ref 70–99)
Glucose-Capillary: 166 mg/dL — ABNORMAL HIGH (ref 70–99)
Glucose-Capillary: 256 mg/dL — ABNORMAL HIGH (ref 70–99)
Glucose-Capillary: 376 mg/dL — ABNORMAL HIGH (ref 70–99)

## 2023-03-13 LAB — TROPONIN I (HIGH SENSITIVITY): Troponin I (High Sensitivity): 3 ng/L (ref <18)

## 2023-03-13 LAB — D-DIMER, QUANTITATIVE: D-Dimer, Quant: 0.36 ug{FEU}/mL (ref 0.00–0.50)

## 2023-03-13 LAB — PROCALCITONIN: Procalcitonin: <0.1 ng/mL

## 2023-03-13 NOTE — Plan of Care (Signed)

## 2023-03-13 NOTE — Plan of Care (Signed)

## 2023-03-13 NOTE — Progress Notes (Signed)
 PROGRESS NOTE    Evelyn Shepherd  VOZ:[id number] DOB: 01-02-1961 DOA: 03/10/2023 PCP: Judy Pimple, MD  Outpatient Specialists: pulm    Brief Narrative:   From admission h and p  Evelyn Shepherd is a 63 y.o. female with medical history significant of OSA, morbid obesity with BMI of 40, lumbar radiculopathy, B12 deficiency, prediabetes, who presents to the ED due to shortness of breath.   Evelyn Shepherd states that for the last 1 month, she has been having a nonproductive cough and shortness of breath with wheezing.  She was seen at an urgent care and started on albuterol and prednisone.  She states that the prednisone seemed to help her breathing the most and she completed the taper on 2/20.  Then within 24-48 hours, she began to experience wheezing/shortness of breath again.  She states that this time it is a bit different as she is also experiencing hot and cold chills, headache.  She denies any chest pain or palpitations.  She notes that her husband has also been feeling unwell for the last few weeks.  She recently established with pulmonology and she has ordered Evelyn Shepherd a home sleep study, but she has not received it yet.  She endorses daily vaping but is willing to quit.  Assessment & Plan:   Principal Problem:   RSV bronchitis Active Problems:   Shortness of breath   OSA (obstructive sleep apnea)   Prediabetes   Hypothyroidism   Hyperlipidemia   OBESITY, MORBID   Depression   Bariatric surgery status   Chronic obstructive pulmonary disease (HCC)   Radiculopathy   Acute bronchiolitis due to human metapneumovirus  # RSV bronchitis # Metapneumovirus bronchitis Non-con CT no acute findings. Possible baseline copd. Remains dyspneic and wheezing. Low procal don't suspect superimposed bacterial infection. - continue steroids - continue azithromycin for anti-inflammatory benefit given possible underlying copd - continue breathing treatments - will add on bnp and dimer today, f/u  those if abnormal  # Chest pain Overnight says had some substernal pain with radiation to left jaw, none currently - troponin  # Acute hypoxic respiratory failure 80s in the ER, 88 today off oxygen, remains symptomatic - continue [place] o2, wean as able  # Elevated A1c Hx one prior, here 7.1 but has been on steroids recently. Glucose markedly elevated yesterday likely 2/2 steroids, improving today - SSI for now  # History bariatric surgery # Distention of thoracic esophagus GI advises endoscopy but best to wait until after recovers from current respiratory illness, provided no signs acute obstruction.  - outpt GI referral  # OSA - cpap at bedtime  # GAD - home lexapro, gabapentin  # Obesity Noted   DVT prophylaxis: lovenox Code Status: full Family Communication: husband updated @ bedside 3/2  Level of care: Med-Surg Status is: Observation    Consultants:  none  Procedures: None   Antimicrobials:  azithromycin    Subjective: Reports breathing stable but still wheezing, coughing  Objective: Vitals:   03/12/23 1939 03/12/23 2011 03/13/23 0459 03/13/23 0812  BP: 110/67  99/69 96/70  Pulse: 71  63 73  Resp: 18  16 20   Temp: 97.9 F (36.6 C)  97.8 F (36.6 C) (!) 97.5 F (36.4 C)  TempSrc:      SpO2: 97% 97% 92% 96%  Weight:      Height:        Intake/Output Summary (Last 24 hours) at 03/13/2023 1333 Last data filed at 03/13/2023 0900 Gross per  24 hour  Intake 360 ml  Output --  Net 360 ml   Filed Weights   03/10/23 1102  Weight: 108.9 kg    Examination:  General exam: Appears calm and comfortable  Respiratory system: rhonchi,  exp wheeze. Better air movement than yesterday Cardiovascular system: S1 & S2 heard, RRR.  Gastrointestinal system: Abdomen is nondistended, soft and nontender. No organomegaly or masses felt. Normal bowel sounds heard. Central nervous system: Alert and oriented. No focal neurological deficits. Extremities: Symmetric 5 x  5 power. Skin: No rashes, lesions or ulcers Psychiatry: Judgement and insight appear normal. Mood & affect appropriate.     Data Reviewed: I have personally reviewed following labs and imaging studies  CBC: Recent Labs  Lab 03/10/23 1104 03/11/23 0416 03/13/23 0239  WBC 6.2 11.0* 10.2  HGB 13.6 14.2 11.8*  HCT 41.3 42.0 35.6*  MCV 91.2 87.3 89.4  PLT 147* 183 152   Basic Metabolic Panel: Recent Labs  Lab 03/10/23 1104 03/11/23 0416 03/12/23 0425 03/13/23 0239  NA 133* 133* 139 139  K 3.7 4.3 4.4 3.9  CL 100 101 106 105  CO2 24 21* 27 28  GLUCOSE 198* 400* 262* 151*  BUN 22 19 27* 23  CREATININE 0.86 0.82 0.75 0.72  CALCIUM 8.6* 8.8* 8.4* 8.4*   GFR: Estimated Creatinine Clearance: 89.6 mL/min (by C-G formula based on SCr of 0.72 mg/dL). Liver Function Tests: No results for input(s): "AST", "ALT", "ALKPHOS", "BILITOT", "PROT", "ALBUMIN" in the last 168 hours. No results for input(s): "LIPASE", "AMYLASE" in the last 168 hours. No results for input(s): "AMMONIA" in the last 168 hours. Coagulation Profile: No results for input(s): "INR", "PROTIME" in the last 168 hours. Cardiac Enzymes: No results for input(s): "CKTOTAL", "CKMB", "CKMBINDEX", "TROPONINI" in the last 168 hours. BNP (last 3 results) No results for input(s): "PROBNP" in the last 8760 hours. HbA1C: No results for input(s): "HGBA1C" in the last 72 hours.  CBG: Recent Labs  Lab 03/12/23 1938 03/12/23 2134 03/13/23 0046 03/13/23 0813 03/13/23 1150  GLUCAP 346* 264* 164* 134* 166*   Lipid Profile: No results for input(s): "CHOL", "HDL", "LDLCALC", "TRIG", "CHOLHDL", "LDLDIRECT" in the last 72 hours. Thyroid Function Tests: No results for input(s): "TSH", "T4TOTAL", "FREET4", "T3FREE", "THYROIDAB" in the last 72 hours. Anemia Panel: No results for input(s): "VITAMINB12", "FOLATE", "FERRITIN", "TIBC", "IRON", "RETICCTPCT" in the last 72 hours. Urine analysis:    Component Value Date/Time    COLORURINE YELLOW (A) 09/25/2018 2014   APPEARANCEUR CLOUDY (A) 09/25/2018 2014   APPEARANCEUR Clear 09/20/2017 1349   LABSPEC 1.018 09/25/2018 2014   PHURINE 7.0 09/25/2018 2014   GLUCOSEU NEGATIVE 09/25/2018 2014   HGBUR LARGE (A) 09/25/2018 2014   BILIRUBINUR Negative 01/30/2021 0915   BILIRUBINUR Negative 09/20/2017 1349   KETONESUR NEGATIVE 09/25/2018 2014   PROTEINUR Negative 01/30/2021 0915   PROTEINUR 100 (A) 09/25/2018 2014   UROBILINOGEN 0.2 01/30/2021 0915   UROBILINOGEN 0.2 03/16/2013 1819   NITRITE Negative 01/30/2021 0915   NITRITE POSITIVE (A) 09/25/2018 2014   LEUKOCYTESUR Negative 01/30/2021 0915   LEUKOCYTESUR MODERATE (A) 09/25/2018 2014   Sepsis Labs: @LABRCNTIP (procalcitonin:4,lacticidven:4)  ) Recent Results (from the past 240 hours)  Resp panel by RT-PCR (RSV, Flu A&B, Covid) Anterior Nasal Swab     Status: Abnormal   Collection Time: 03/10/23 12:13 PM   Specimen: Anterior Nasal Swab  Result Value Ref Range Status   SARS Coronavirus 2 by RT PCR NEGATIVE NEGATIVE Final    Comment: (NOTE) SARS-CoV-2 target  nucleic acids are NOT DETECTED.  The SARS-CoV-2 RNA is generally detectable in upper respiratory specimens during the acute phase of infection. The lowest concentration of SARS-CoV-2 viral copies this assay can detect is 138 copies/mL. A negative result does not preclude SARS-Cov-2 infection and should not be used as the sole basis for treatment or other patient management decisions. A negative result may occur with  improper specimen collection/handling, submission of specimen other than nasopharyngeal swab, presence of viral mutation(s) within the areas targeted by this assay, and inadequate number of viral copies(<138 copies/mL). A negative result must be combined with clinical observations, patient history, and epidemiological information. The expected result is Negative.  Fact Sheet for Patients:   BloggerCourse.com  Fact Sheet for Healthcare Providers:  SeriousBroker.it  This test is no t yet approved or cleared by the [place] FDA and  has been authorized for detection and/or diagnosis of SARS-CoV-2 by FDA under an Emergency Use Authorization (EUA). This EUA will remain  in effect (meaning this test can be used) for the duration of the COVID-19 declaration under Section 564(b)(1) of the Act, 21 U.S.C.section 360bbb-3(b)(1), unless the authorization is terminated  or revoked sooner.       Influenza A by PCR NEGATIVE NEGATIVE Final   Influenza B by PCR NEGATIVE NEGATIVE Final    Comment: (NOTE) The Xpert Xpress SARS-CoV-2/FLU/RSV plus assay is intended as an aid in the diagnosis of influenza from Nasopharyngeal swab specimens and should not be used as a sole basis for treatment. Nasal washings and aspirates are unacceptable for Xpert Xpress SARS-CoV-2/FLU/RSV testing.  Fact Sheet for Patients: BloggerCourse.com  Fact Sheet for Healthcare Providers: SeriousBroker.it  This test is not yet approved or cleared by the [place] FDA and has been authorized for detection and/or diagnosis of SARS-CoV-2 by FDA under an Emergency Use Authorization (EUA). This EUA will remain in effect (meaning this test can be used) for the duration of the COVID-19 declaration under Section 564(b)(1) of the Act, 21 U.S.C. section 360bbb-3(b)(1), unless the authorization is terminated or revoked.     Resp Syncytial Virus by PCR POSITIVE (A) NEGATIVE Final    Comment: (NOTE) Fact Sheet for Patients: BloggerCourse.com  Fact Sheet for Healthcare Providers: SeriousBroker.it  This test is not yet approved or cleared by the [place] FDA and has been authorized for detection and/or diagnosis of SARS-CoV-2 by FDA under an Emergency Use  Authorization (EUA). This EUA will remain in effect (meaning this test can be used) for the duration of the COVID-19 declaration under Section 564(b)(1) of the Act, 21 U.S.C. section 360bbb-3(b)(1), unless the authorization is terminated or revoked.  Performed at [facility], [address] [place], [place] [address]   Respiratory (~20 pathogens) panel by PCR     Status: Abnormal   Collection Time: 03/11/23 10:50 AM   Specimen: Nasopharyngeal Swab; Respiratory  Result Value Ref Range Status   Adenovirus NOT DETECTED NOT DETECTED Final   Coronavirus 229E NOT DETECTED NOT DETECTED Final    Comment: (NOTE) The Coronavirus on the Respiratory Panel, DOES NOT test for the novel  Coronavirus (2019 nCoV)    Coronavirus HKU1 NOT DETECTED NOT DETECTED Final   Coronavirus NL63 NOT DETECTED NOT DETECTED Final   Coronavirus OC43 NOT DETECTED NOT DETECTED Final   Metapneumovirus DETECTED (A) NOT DETECTED Final   Rhinovirus / Enterovirus NOT DETECTED NOT DETECTED Final   Influenza A NOT DETECTED NOT DETECTED Final   Influenza B NOT DETECTED NOT DETECTED Final  Parainfluenza Virus 1 NOT DETECTED NOT DETECTED Final   Parainfluenza Virus 2 NOT DETECTED NOT DETECTED Final   Parainfluenza Virus 3 NOT DETECTED NOT DETECTED Final   Parainfluenza Virus 4 NOT DETECTED NOT DETECTED Final   Respiratory Syncytial Virus DETECTED (A) NOT DETECTED Final   Bordetella pertussis NOT DETECTED NOT DETECTED Final   Bordetella Parapertussis NOT DETECTED NOT DETECTED Final   Chlamydophila pneumoniae NOT DETECTED NOT DETECTED Final   Mycoplasma pneumoniae NOT DETECTED NOT DETECTED Final    Comment: Performed at [facility], [address] [place], [place] [address]         Radiology Studies: No results found.       Scheduled Meds:  azithromycin  250 mg Oral Daily   enoxaparin (LOVENOX) injection  0.5 mg/kg Subcutaneous Q24H   escitalopram  20 mg Oral Daily   insulin aspart  0-15  Units Subcutaneous TID WC   insulin aspart  0-5 Units Subcutaneous QHS   ipratropium-albuterol  3 mL Nebulization TID   predniSONE  40 mg Oral Q breakfast   pregabalin  25 mg Oral BID   rosuvastatin  10 mg Oral Daily   sodium chloride flush  3 mL Intravenous Q12H   Continuous Infusions:   LOS: 1 day     Silvano Bilis, MD [facility]   If 7PM-7AM, please contact night-coverage [email or web address] Password [email or web address] 03/13/2023, 1:33 PM

## 2023-03-14 ENCOUNTER — Inpatient Hospital Stay

## 2023-03-14 DIAGNOSIS — J205 Acute bronchitis due to respiratory syncytial virus: Secondary | ICD-10-CM | POA: Diagnosis not present

## 2023-03-14 LAB — BASIC METABOLIC PANEL
Anion gap: 6 (ref 5–15)
BUN: 24 mg/dL — ABNORMAL HIGH (ref 8–23)
CO2: 29 mmol/L (ref 22–32)
Calcium: 8.6 mg/dL — ABNORMAL LOW (ref 8.9–10.3)
Chloride: 105 mmol/L (ref 98–111)
Creatinine, Ser: 0.68 mg/dL (ref 0.44–1.00)
GFR, Estimated: >60 mL/min (ref >60)
Glucose, Bld: 112 mg/dL — ABNORMAL HIGH (ref 70–99)
Potassium: 3.9 mmol/L (ref 3.5–5.1)
Sodium: 140 mmol/L (ref 135–145)

## 2023-03-14 LAB — CBC
HCT: 34.1 % — ABNORMAL LOW (ref 36.0–46.0)
Hemoglobin: 11.4 g/dL — ABNORMAL LOW (ref 12.0–15.0)
MCH: 29.8 pg (ref 26.0–34.0)
MCHC: 33.4 g/dL (ref 30.0–36.0)
MCV: 89 fL (ref 80.0–100.0)
Platelets: 149 10*3/uL — ABNORMAL LOW (ref 150–400)
RBC: 3.83 MIL/uL — ABNORMAL LOW (ref 3.87–5.11)
RDW: 12.4 % (ref 11.5–15.5)
WBC: 9.7 10*3/uL (ref 4.0–10.5)
nRBC: 0 % (ref 0.0–0.2)

## 2023-03-14 LAB — GLUCOSE, CAPILLARY
Glucose-Capillary: 102 mg/dL — ABNORMAL HIGH (ref 70–99)
Glucose-Capillary: 190 mg/dL — ABNORMAL HIGH (ref 70–99)
Glucose-Capillary: 298 mg/dL — ABNORMAL HIGH (ref 70–99)
Glucose-Capillary: 250 mg/dL — ABNORMAL HIGH (ref 70–99)

## 2023-03-14 NOTE — Progress Notes (Signed)
 Introduced patient to role of Statistician. Intake questions completed.  Patient retired from [employer] in 2019 after about 40 years. She currently works part time for a [employer]. Has an established PCP and denies any SDOH needs at present.

## 2023-03-14 NOTE — Inpatient Diabetes Management (Signed)
 Inpatient Diabetes Program Recommendations  AACE/ADA: New Consensus Statement on Inpatient Glycemic Control   Target Ranges:  Prepandial:   less than 140 mg/dL      Peak postprandial:   less than 180 mg/dL (1-2 hours)      Critically ill patients:  140 - 180 mg/dL    Latest Reference Range & Units 03/13/23 08:13 03/13/23 11:50 03/13/23 17:11 03/13/23 21:06 03/14/23 07:38  Glucose-Capillary 70 - 99 mg/dL 595 (H) 638 (H) 756 (H) 256 (H) 102 (H)   Review of Glycemic Control  Diabetes history: PreDM Outpatient Diabetes medications: None Current orders for Inpatient glycemic control: Novolog 0-15 units TID with meals, Novolog 0-5 units at bedtime; Prednisone 40 mg QAM  Inpatient Diabetes Program Recommendations:    Insulin: If steroids are continued, please consider ordering Novolog 3 units TID with meals for meal coverage if patient eats at least 50% of meals.   Thanks, Orlando Penner, RN, MSN, CDCES Diabetes Coordinator Inpatient Diabetes Program [phone] (Team Pager from 8am to 5pm)

## 2023-03-14 NOTE — Plan of Care (Signed)

## 2023-03-14 NOTE — Progress Notes (Signed)
 PROGRESS NOTE    Evelyn ELZA  Shepherd:[id number] DOB: 05-20-1960 DOA: 03/10/2023 PCP: Judy Pimple, MD  Outpatient Specialists: pulm    Brief Narrative:   From admission h and p  Evelyn Shepherd is a 63 y.o. female with medical history significant of OSA, morbid obesity with BMI of 40, lumbar radiculopathy, B12 deficiency, prediabetes, who presents to the ED due to shortness of breath.   Evelyn Shepherd states that for the last 1 month, she has been having a nonproductive cough and shortness of breath with wheezing.  She was seen at an urgent care and started on albuterol and prednisone.  She states that the prednisone seemed to help her breathing the most and she completed the taper on 2/20.  Then within 24-48 hours, she began to experience wheezing/shortness of breath again.  She states that this time it is a bit different as she is also experiencing hot and cold chills, headache.  She denies any chest pain or palpitations.  She notes that her husband has also been feeling unwell for the last few weeks.  She recently established with pulmonology and she has ordered Mrs. Bertone a home sleep study, but she has not received it yet.  She endorses daily vaping but is willing to quit.  Assessment & Plan:   Principal Problem:   RSV bronchitis Active Problems:   Shortness of breath   OSA (obstructive sleep apnea)   Prediabetes   Hypothyroidism   Hyperlipidemia   OBESITY, MORBID   Depression   Bariatric surgery status   Chronic obstructive pulmonary disease (HCC)   Radiculopathy   Acute bronchiolitis due to human metapneumovirus  # RSV bronchitis # Metapneumovirus bronchitis Non-con CT no acute findings. Possible baseline copd. Remains dyspneic and wheezing. Low procal don't suspect superimposed bacterial infection. Dimer, bnp, and trop all wnl 3/2. Repeat CT obtained today, no complications identified. - continue steroids - continue azithromycin for anti-inflammatory benefit given possible  underlying copd - continue breathing treatments - consider pulm involvement if unable to wean o2 today  # Acute hypoxic respiratory failure 80s in the ER, low 90s today off oxygen - will have nursing confirm o2 in a little bit  # Back pain Ttp left inferior posterior rib. No dysuria, no lower extremity symptoms. Likely msk from coughing - monitor  # Elevated A1c Hx one prior, here 7.1 but has been on steroids recently. Glucose markedly elevated yesterday likely 2/2 steroids, improving today - SSI for now  # History bariatric surgery # Distention of thoracic esophagus GI advises endoscopy but best to wait until after recovers from current respiratory illness, provided no signs acute obstruction.  - outpt GI referral  # OSA - cpap at bedtime  # GAD - home lexapro, gabapentin  # Obesity Noted   DVT prophylaxis: lovenox Code Status: full Family Communication: husband updated @ bedside 3/3  Level of care: Med-Surg Status is: Observation    Consultants:  none  Procedures: None   Antimicrobials:  azithromycin    Subjective: Reports some improvement in dyspnea today  Objective: Vitals:   03/14/23 0651 03/14/23 0732 03/14/23 0737 03/14/23 1318  BP: 113/72  118/87   Pulse: 71  87   Resp: 18  18   Temp: (!) 97.5 F (36.4 C)  98.5 F (36.9 C)   TempSrc:   Oral   SpO2: 92% 97% 99% 96%  Weight:      Height:        Intake/Output Summary (Last 24  hours) at 03/14/2023 1356 Last data filed at 03/14/2023 0900 Gross per 24 hour  Intake 100 ml  Output --  Net 100 ml   Filed Weights   03/10/23 1102  Weight: 108.9 kg    Examination:  General exam: Appears calm and comfortable  Respiratory system: faint exp wheeze. Rhonchi improved Cardiovascular system: S1 & S2 heard, RRR.  Gastrointestinal system: Abdomen is nondistended, soft and nontender. No organomegaly or masses felt. Normal bowel sounds heard. Central nervous system: Alert and oriented. No focal  neurological deficits. Extremities: Symmetric 5 x 5 power. Skin: No rashes, lesions or ulcers Psychiatry: Judgement and insight appear normal. Mood & affect appropriate.     Data Reviewed: I have personally reviewed following labs and imaging studies  CBC: Recent Labs  Lab 03/10/23 1104 03/11/23 0416 03/13/23 0239 03/14/23 0522  WBC 6.2 11.0* 10.2 9.7  HGB 13.6 14.2 11.8* 11.4*  HCT 41.3 42.0 35.6* 34.1*  MCV 91.2 87.3 89.4 89.0  PLT 147* 183 152 149*   Basic Metabolic Panel: Recent Labs  Lab 03/10/23 1104 03/11/23 0416 03/12/23 0425 03/13/23 0239 03/14/23 0522  NA 133* 133* 139 139 140  K 3.7 4.3 4.4 3.9 3.9  CL 100 101 106 105 105  CO2 24 21* 27 28 29   GLUCOSE 198* 400* 262* 151* 112*  BUN 22 19 27* 23 24*  CREATININE 0.86 0.82 0.75 0.72 0.68  CALCIUM 8.6* 8.8* 8.4* 8.4* 8.6*   GFR: Estimated Creatinine Clearance: 89.6 mL/min (by C-G formula based on SCr of 0.68 mg/dL). Liver Function Tests: No results for input(s): "AST", "ALT", "ALKPHOS", "BILITOT", "PROT", "ALBUMIN" in the last 168 hours. No results for input(s): "LIPASE", "AMYLASE" in the last 168 hours. No results for input(s): "AMMONIA" in the last 168 hours. Coagulation Profile: No results for input(s): "INR", "PROTIME" in the last 168 hours. Cardiac Enzymes: No results for input(s): "CKTOTAL", "CKMB", "CKMBINDEX", "TROPONINI" in the last 168 hours. BNP (last 3 results) No results for input(s): "PROBNP" in the last 8760 hours. HbA1C: No results for input(s): "HGBA1C" in the last 72 hours.  CBG: Recent Labs  Lab 03/13/23 1150 03/13/23 1711 03/13/23 2106 03/14/23 0738 03/14/23 1132  GLUCAP 166* 376* 256* 102* 190*   Lipid Profile: No results for input(s): "CHOL", "HDL", "LDLCALC", "TRIG", "CHOLHDL", "LDLDIRECT" in the last 72 hours. Thyroid Function Tests: No results for input(s): "TSH", "T4TOTAL", "FREET4", "T3FREE", "THYROIDAB" in the last 72 hours. Anemia Panel: No results for input(s):  "VITAMINB12", "FOLATE", "FERRITIN", "TIBC", "IRON", "RETICCTPCT" in the last 72 hours. Urine analysis:    Component Value Date/Time   COLORURINE YELLOW (A) 09/25/2018 2014   APPEARANCEUR CLOUDY (A) 09/25/2018 2014   APPEARANCEUR Clear 09/20/2017 1349   LABSPEC 1.018 09/25/2018 2014   PHURINE 7.0 09/25/2018 2014   GLUCOSEU NEGATIVE 09/25/2018 2014   HGBUR LARGE (A) 09/25/2018 2014   BILIRUBINUR Negative 01/30/2021 0915   BILIRUBINUR Negative 09/20/2017 1349   KETONESUR NEGATIVE 09/25/2018 2014   PROTEINUR Negative 01/30/2021 0915   PROTEINUR 100 (A) 09/25/2018 2014   UROBILINOGEN 0.2 01/30/2021 0915   UROBILINOGEN 0.2 03/16/2013 1819   NITRITE Negative 01/30/2021 0915   NITRITE POSITIVE (A) 09/25/2018 2014   LEUKOCYTESUR Negative 01/30/2021 0915   LEUKOCYTESUR MODERATE (A) 09/25/2018 2014   Sepsis Labs: @LABRCNTIP (procalcitonin:4,lacticidven:4)  ) Recent Results (from the past 240 hours)  Resp panel by RT-PCR (RSV, Flu A&B, Covid) Anterior Nasal Swab     Status: Abnormal   Collection Time: 03/10/23 12:13 PM   Specimen: Anterior Nasal  Swab  Result Value Ref Range Status   SARS Coronavirus 2 by RT PCR NEGATIVE NEGATIVE Final    Comment: (NOTE) SARS-CoV-2 target nucleic acids are NOT DETECTED.  The SARS-CoV-2 RNA is generally detectable in upper respiratory specimens during the acute phase of infection. The lowest concentration of SARS-CoV-2 viral copies this assay can detect is 138 copies/mL. A negative result does not preclude SARS-Cov-2 infection and should not be used as the sole basis for treatment or other patient management decisions. A negative result may occur with  improper specimen collection/handling, submission of specimen other than nasopharyngeal swab, presence of viral mutation(s) within the areas targeted by this assay, and inadequate number of viral copies(<138 copies/mL). A negative result must be combined with clinical observations, patient history, and  epidemiological information. The expected result is Negative.  Fact Sheet for Patients:  BloggerCourse.com  Fact Sheet for Healthcare Providers:  SeriousBroker.it  This test is no t yet approved or cleared by the [place] FDA and  has been authorized for detection and/or diagnosis of SARS-CoV-2 by FDA under an Emergency Use Authorization (EUA). This EUA will remain  in effect (meaning this test can be used) for the duration of the COVID-19 declaration under Section 564(b)(1) of the Act, 21 U.S.C.section 360bbb-3(b)(1), unless the authorization is terminated  or revoked sooner.       Influenza A by PCR NEGATIVE NEGATIVE Final   Influenza B by PCR NEGATIVE NEGATIVE Final    Comment: (NOTE) The Xpert Xpress SARS-CoV-2/FLU/RSV plus assay is intended as an aid in the diagnosis of influenza from Nasopharyngeal swab specimens and should not be used as a sole basis for treatment. Nasal washings and aspirates are unacceptable for Xpert Xpress SARS-CoV-2/FLU/RSV testing.  Fact Sheet for Patients: BloggerCourse.com  Fact Sheet for Healthcare Providers: SeriousBroker.it  This test is not yet approved or cleared by the [place] FDA and has been authorized for detection and/or diagnosis of SARS-CoV-2 by FDA under an Emergency Use Authorization (EUA). This EUA will remain in effect (meaning this test can be used) for the duration of the COVID-19 declaration under Section 564(b)(1) of the Act, 21 U.S.C. section 360bbb-3(b)(1), unless the authorization is terminated or revoked.     Resp Syncytial Virus by PCR POSITIVE (A) NEGATIVE Final    Comment: (NOTE) Fact Sheet for Patients: BloggerCourse.com  Fact Sheet for Healthcare Providers: SeriousBroker.it  This test is not yet approved or cleared by the [place] FDA and has  been authorized for detection and/or diagnosis of SARS-CoV-2 by FDA under an Emergency Use Authorization (EUA). This EUA will remain in effect (meaning this test can be used) for the duration of the COVID-19 declaration under Section 564(b)(1) of the Act, 21 U.S.C. section 360bbb-3(b)(1), unless the authorization is terminated or revoked.  Performed at [facility], [address] [place], [place] [address]   Respiratory (~20 pathogens) panel by PCR     Status: Abnormal   Collection Time: 03/11/23 10:50 AM   Specimen: Nasopharyngeal Swab; Respiratory  Result Value Ref Range Status   Adenovirus NOT DETECTED NOT DETECTED Final   Coronavirus 229E NOT DETECTED NOT DETECTED Final    Comment: (NOTE) The Coronavirus on the Respiratory Panel, DOES NOT test for the novel  Coronavirus (2019 nCoV)    Coronavirus HKU1 NOT DETECTED NOT DETECTED Final   Coronavirus NL63 NOT DETECTED NOT DETECTED Final   Coronavirus OC43 NOT DETECTED NOT DETECTED Final   Metapneumovirus DETECTED (A) NOT DETECTED Final   Rhinovirus /  Enterovirus NOT DETECTED NOT DETECTED Final   Influenza A NOT DETECTED NOT DETECTED Final   Influenza B NOT DETECTED NOT DETECTED Final   Parainfluenza Virus 1 NOT DETECTED NOT DETECTED Final   Parainfluenza Virus 2 NOT DETECTED NOT DETECTED Final   Parainfluenza Virus 3 NOT DETECTED NOT DETECTED Final   Parainfluenza Virus 4 NOT DETECTED NOT DETECTED Final   Respiratory Syncytial Virus DETECTED (A) NOT DETECTED Final   Bordetella pertussis NOT DETECTED NOT DETECTED Final   Bordetella Parapertussis NOT DETECTED NOT DETECTED Final   Chlamydophila pneumoniae NOT DETECTED NOT DETECTED Final   Mycoplasma pneumoniae NOT DETECTED NOT DETECTED Final    Comment: Performed at [facility], [address] [place], [place] [address]         Radiology Studies: CT CHEST WO CONTRAST Result Date: 03/14/2023 CLINICAL DATA:  Ongoing hypoxia and dyspnea. EXAM: CT CHEST  WITHOUT CONTRAST TECHNIQUE: Multidetector CT imaging of the chest was performed following the standard protocol without IV contrast. RADIATION DOSE REDUCTION: This exam was performed according to the departmental dose-optimization program which includes automated exposure control, adjustment of the mA and/or kV according to patient size and/or use of iterative reconstruction technique. COMPARISON:  CT 03/10/2023 FINDINGS: Cardiovascular: On this non IV contrast exam the thoracic aorta is normal course and caliber. There is a bovine type aortic arch. Heart is nonenlarged. No pericardial effusion. Mediastinum/Nodes: Thyroid gland is incompletely included in the imaging field. On this non IV contrast exam, there is no specific abnormal lymph node enlargement present in the axillary regions, hilum or mediastinum. As seen on previous there is a dilated esophagus diffusely with significant bit luminal debris. There is a gastric band device at the GE junction. Please correlate with clinical symptomatology and any known history otherwise recommend further workup of this dilatation. Please correlate with functionality of the band. Lungs/Pleura: There is some linear opacity lung bases likely scar or atelectasis. No consolidation, pneumothorax or effusion. No edema. Breathing motion. Upper Abdomen: Adrenal glands are preserved. Small cystic areas once again seen in the liver. Musculoskeletal: Scattered degenerative changes along the spine. Bilateral breast implants. IMPRESSION: Basilar scar or atelectasis.  Breathing motion. Once again there is a dilated esophagus with extensive luminal debris. Gastric band noted. Please correlate with any known history or prior workup as this was seen previously. Please correlate with the functionality of the band as well. Electronically Signed   By: Karen Kays M.D.   On: 03/14/2023 12:56         Scheduled Meds:  enoxaparin (LOVENOX) injection  0.5 mg/kg Subcutaneous Q24H    escitalopram  20 mg Oral Daily   insulin aspart  0-15 Units Subcutaneous TID WC   insulin aspart  0-5 Units Subcutaneous QHS   ipratropium-albuterol  3 mL Nebulization TID   predniSONE  40 mg Oral Q breakfast   pregabalin  25 mg Oral BID   rosuvastatin  10 mg Oral Daily   sodium chloride flush  3 mL Intravenous Q12H   Continuous Infusions:   LOS: 2 days     Silvano Bilis, MD [facility]   If 7PM-7AM, please contact night-coverage [email or web address] Password [email or web address] 03/14/2023, 1:56 PM

## 2023-03-14 NOTE — Plan of Care (Signed)
°  Problem: Education: ?Goal: Knowledge of General Education information will improve ?Description: Including pain rating scale, medication(s)/side effects and non-pharmacologic comfort measures ?Outcome: Progressing ?  ?Problem: Health Behavior/Discharge Planning: ?Goal: Ability to manage health-related needs will improve ?Outcome: Progressing ?  ?Problem: Clinical Measurements: ?Goal: Ability to maintain clinical measurements within normal limits will improve ?Outcome: Progressing ?Goal: Will remain free from infection ?Outcome: Progressing ?Goal: Diagnostic test results will improve ?Outcome: Progressing ?Goal: Respiratory complications will improve ?Outcome: Progressing ?Goal: Cardiovascular complication will be avoided ?Outcome: Progressing ?  ?Problem: Coping: ?Goal: Level of anxiety will decrease ?Outcome: Progressing ?  ?Problem: Elimination: ?Goal: Will not experience complications related to bowel motility ?Outcome: Progressing ?Goal: Will not experience complications related to urinary retention ?Outcome: Progressing ?  ?Problem: Skin Integrity: ?Goal: Risk for impaired skin integrity will decrease ?Outcome: Progressing ?  ?Problem: Safety: ?Goal: Ability to remain free from injury will improve ?Outcome: Progressing ?  ?

## 2023-03-15 ENCOUNTER — Encounter: Payer: Self-pay | Admitting: Internal Medicine

## 2023-03-15 ENCOUNTER — Inpatient Hospital Stay

## 2023-03-15 ENCOUNTER — Other Ambulatory Visit: Payer: Self-pay

## 2023-03-15 DIAGNOSIS — J205 Acute bronchitis due to respiratory syncytial virus: Secondary | ICD-10-CM | POA: Diagnosis not present

## 2023-03-15 LAB — BASIC METABOLIC PANEL
Anion gap: 5 (ref 5–15)
BUN: 26 mg/dL — ABNORMAL HIGH (ref 8–23)
CO2: 28 mmol/L (ref 22–32)
Calcium: 8.4 mg/dL — ABNORMAL LOW (ref 8.9–10.3)
Chloride: 105 mmol/L (ref 98–111)
Creatinine, Ser: 0.72 mg/dL (ref 0.44–1.00)
GFR, Estimated: >60 mL/min (ref >60)
Glucose, Bld: 131 mg/dL — ABNORMAL HIGH (ref 70–99)
Potassium: 3.8 mmol/L (ref 3.5–5.1)
Sodium: 138 mmol/L (ref 135–145)

## 2023-03-15 LAB — CBC
HCT: 33 % — ABNORMAL LOW (ref 36.0–46.0)
Hemoglobin: 10.9 g/dL — ABNORMAL LOW (ref 12.0–15.0)
MCH: 29.7 pg (ref 26.0–34.0)
MCHC: 33 g/dL (ref 30.0–36.0)
MCV: 89.9 fL (ref 80.0–100.0)
Platelets: 155 10*3/uL (ref 150–400)
RBC: 3.67 MIL/uL — ABNORMAL LOW (ref 3.87–5.11)
RDW: 12.4 % (ref 11.5–15.5)
WBC: 9.4 10*3/uL (ref 4.0–10.5)
nRBC: 0 % (ref 0.0–0.2)

## 2023-03-15 LAB — GLUCOSE, CAPILLARY
Glucose-Capillary: 97 mg/dL (ref 70–99)
Glucose-Capillary: 191 mg/dL — ABNORMAL HIGH (ref 70–99)
Glucose-Capillary: 354 mg/dL — ABNORMAL HIGH (ref 70–99)

## 2023-03-15 MED ORDER — PREDNISONE 10 MG PO TABS
ORAL_TABLET | ORAL | 0 refills | Status: AC
  Filled 2023-03-15: qty 18, 9d supply, fill #0

## 2023-03-15 NOTE — Progress Notes (Signed)
 Patient being discharged home. PIV removed. Discharge instructions were went over with patient as well as medications. Patient was given her prescribed medications delivered by pharmacy. Patient will be going home POV with husband.

## 2023-03-15 NOTE — Inpatient Diabetes Management (Signed)
 Inpatient Diabetes Program Recommendations  AACE/ADA: New Consensus Statement on Inpatient Glycemic Control (2015)  Target Ranges:  Prepandial:   less than 140 mg/dL      Peak postprandial:   less than 180 mg/dL (1-2 hours)      Critically ill patients:  140 - 180 mg/dL    Latest Reference Range & Units 03/14/23 07:38 03/14/23 11:32 03/14/23 17:11 03/14/23 21:10  Glucose-Capillary 70 - 99 mg/dL 295 (H) 621 (H) 308 (H) 250 (H)   Review of Glycemic Control  Diabetes history: PreDM Outpatient Diabetes medications: None Current orders for Inpatient glycemic control: Novolog 0-15 units TID with meals, Novolog 0-5 units at bedtime; Prednisone 40 mg QAM   Inpatient Diabetes Program Recommendations:     Insulin: If steroids are continued, please consider ordering Novolog 3 units TID with meals for meal coverage if patient eats at least 50% of meals.   Thanks, Orlando Penner, RN, MSN, CDCES Diabetes Coordinator Inpatient Diabetes Program [phone] (Team Pager from 8am to 5pm)

## 2023-03-15 NOTE — Discharge Summary (Signed)
 Evelyn Shepherd:[id number] DOB: September 12, 1960 DOA: 03/10/2023  PCP: Judy Pimple, MD  Admit date: 03/10/2023 Discharge date: 03/15/2023  Time spent: 35 minutes  Recommendations for Outpatient Follow-up:  Pcp f/u 1 week Pulmonology f/u (referred) GI f/u (referred) Bariatric surgery f/u (patient to arrange)     Discharge Diagnoses:  Principal Problem:   RSV bronchitis Active Problems:   Shortness of breath   OSA (obstructive sleep apnea)   Prediabetes   Hypothyroidism   Hyperlipidemia   OBESITY, MORBID   Depression   Bariatric surgery status   Chronic obstructive pulmonary disease (HCC)   Radiculopathy   Acute bronchiolitis due to human metapneumovirus   Discharge Condition: improving  Diet recommendation: heart healthy  Filed Weights   03/10/23 1102  Weight: 108.9 kg    History of present illness:  From admission h and p Evelyn Shepherd is a 63 y.o. female with medical history significant of OSA, morbid obesity with BMI of 40, lumbar radiculopathy, B12 deficiency, prediabetes, who presents to the ED due to shortness of breath.   Mrs. Talsma states that for the last 1 month, she has been having a nonproductive cough and shortness of breath with wheezing.  She was seen at an urgent care and started on albuterol and prednisone.  She states that the prednisone seemed to help her breathing the most and she completed the taper on 2/20.  Then within 24-48 hours, she began to experience wheezing/shortness of breath again.  She states that this time it is a bit different as she is also experiencing hot and cold chills, headache.  She denies any chest pain or palpitations.  She notes that her husband has also been feeling unwell for the last few weeks.  She recently established with pulmonology and she has ordered Mrs. Christensen a home sleep study, but she has not received it yet.  She endorses daily vaping but is willing to quit.  Hospital Course:  Patient presents with bronchitis, found  to be rsv and metapneumovirus positive. Nothing focal on CT, normal bnp, trop and dimer. Hospitalized because of new oxygen requirement. Treated with steroids and a course of azithromycin. Slow but steady improvement, by day of discharge weaned off oxygen. Incidental finding on CT is dilated thoracic esophagus, patient has a history of lab band bariatric surgery, and it appears the lap band may have migrated to the distal esophagus causing partial stricture. No acute obstruction - case discussed with GI, given respiratory status best to avoid sedation for procedure unless absolutely necessary, so plan will be close GI f/u as will need upper endoscopy to evaluate for other causes of this apparent stricture, will also need bariatric surgery f/u and patient will arrange that. A1c is elevated but has had recent steroids, so advise repeat A1c in several months once off steroids. Discharged with steroid taper and instructions to use home prn albuterol and to follow up with PCP in about 1 week for re-assessment.   Procedures: none   Consultations: pulmonology  Discharge Exam: Vitals:   03/15/23 0824 03/15/23 1352  BP:    Pulse:  81  Resp:  16  Temp:    SpO2: 95% 92%    General: NAD Cardiovascular: rrr Respiratory: exp wheeze, coarse breath sounds  Discharge Instructions   Discharge Instructions     Diet - low sodium heart healthy   Complete by: As directed    Increase activity slowly   Complete by: As directed       Allergies  as of 03/15/2023       Reactions   Codeine Itching   Meloxicam    REACTION: unspecified   Sulfonamide Derivatives    REACTION: unspecified        Medication List     STOP taking these medications    azithromycin 250 MG tablet Commonly known as: ZITHROMAX       TAKE these medications    albuterol 108 (90 Base) MCG/ACT inhaler Commonly known as: VENTOLIN HFA Inhale 2 puffs into the lungs every 6 (six) hours as needed (cough).   benzonatate 100  MG capsule Commonly known as: Tessalon Perles Take 1 capsule (100 mg total) by mouth 3 (three) times daily as needed for cough.   cyanocobalamin 1000 MCG/ML injection Commonly known as: VITAMIN B12 Inject 1 mL (1,000 mcg total) into the muscle every 30 (thirty) days.   escitalopram 20 MG tablet Commonly known as: LEXAPRO Take 1 tablet (20 mg total) by mouth daily.   fluticasone 50 MCG/ACT nasal spray Commonly known as: FLONASE Use 2 sprays in each nostril once daily (Spray two sprays in each nostril once daily)   ibuprofen 800 MG tablet Commonly known as: ADVIL Take 1 tablet (800 mg total) by mouth every 8 (eight) hours as needed (with food).   ketoconazole 2 % cream Commonly known as: NIZORAL Apply to the affected area(s) daily. (Apply topically daily.)   Magnesium Citrate 125 MG Caps Take 1 capsule by mouth in the morning and at bedtime.   meclizine 25 MG tablet Commonly known as: ANTIVERT Take 1 tablet (25 mg total) by mouth as needed for dizziness.   mometasone 0.1 % lotion Commonly known as: ELOCON Apply to itchy ears twice daily for 12 days.  May repeat as needed for itchy ears   predniSONE 10 MG tablet Commonly known as: DELTASONE Take 3 tablets (30 mg total) by mouth daily for 3 days, THEN 2 tablets (20 mg total) daily for 3 days, THEN 1 tablet (10 mg total) daily for 3 days. Start taking on: March 15, 2023   pregabalin 25 MG capsule Commonly known as: LYRICA Take 1 capsule (25 mg total) by mouth 2 (two) times daily.   rosuvastatin 10 MG tablet Commonly known as: CRESTOR Take 1 tablet (10 mg total) by mouth daily.   SUMAtriptan 100 MG tablet Commonly known as: IMITREX Take 1 tablet (100 mg total) by mouth once as needed for up to 1 dose for headache, can repeat dose in 2 hours if needed, maximum dose of 2 tablets in one day   SYRINGE 3CC/21GX1" 21G X 1" 3 ML Misc Use with B12 injections   tiZANidine 2 MG tablet Commonly known as: ZANAFLEX Take 1  tablet (2 mg total) by mouth every 8 (eight) hours What changed: Another medication with the same name was removed. Continue taking this medication, and follow the directions you see here.   triamcinolone cream 0.1 % Commonly known as: KENALOG Apply 1 Application topically 2 (two) times daily as needed (to affected areas).   Vitamin D (Ergocalciferol) 1.25 MG (50000 UNIT) Caps capsule Commonly known as: DRISDOL Take 1 capsule (50,000 Units total) by mouth every 7 (seven) days.               Durable Medical Equipment  (From admission, onward)           Start     Ordered   03/15/23 1313  For home use only DME oxygen  Once  Question Answer Comment  Length of Need 6 Months   Mode or (Route) Nasal cannula   Liters per Minute 2   Frequency Continuous (stationary and portable oxygen unit needed)   Oxygen delivery system Gas      03/15/23 1313           Allergies  Allergen Reactions   Codeine Itching   Meloxicam     REACTION: unspecified   Sulfonamide Derivatives     REACTION: unspecified    Follow-up Information     Tower, Audrie Gallus, MD. Schedule an appointment as soon as possible for a visit .   Specialties: Family Medicine, Radiology Contact information: [address] [place] [address] [phone]         Midge Minium, MD Follow up.   Specialty: Gastroenterology Contact information: [address] [place]  [place] [address] [phone]         Vida Rigger, MD Follow up.   Specialty: Pulmonary Disease Contact information: [address] [place] [address] [phone]                  The results of significant diagnostics from this hospitalization (including imaging, microbiology, ancillary and laboratory) are listed below for reference.    Significant Diagnostic Studies: DG ESOPHAGUS W SINGLE CM (SOL OR THIN BA) Result Date: 03/15/2023 CLINICAL DATA:  Dysphagia. Globus sensation in both solids and  liquids in the chest. History of laparoscopic gastric band in 2009. EXAM: ESOPHAGUS/BARIUM SWALLOW/TABLET STUDY TECHNIQUE: Limited single contrast examination was performed using thin liquid barium. This exam was performed by Brayton El PA-C , and was supervised and interpreted by Dr. Paulina Fusi. FLUOROSCOPY: Radiation Exposure Index (as provided by the fluoroscopic device): 1 minute 42 seconds. 1120.34 micro gray meter squared COMPARISON:  None Available. FINDINGS: Swallowing: Appears normal. No vestibular penetration or aspiration seen. Pharynx: Unremarkable. Esophagus: Dilated and patulous distal esophagus. No definitive lesions. Apparent long segment smooth walled narrowing through the region of the gastric band. After approximately 6 minutes, only small volume of contrast has passed beyond the band into the stomach. Esophageal motility: Not fully assessed given limited volume of contrast ingested. Gastroesophageal reflux: Not assessed. Ingested 13 mm barium tablet: Not given IMPRESSION: Long segment smooth walled narrowing through the region of the gastric band. This results in dilated patulous distal esophagus with retained contrast. After 6 minutes, only small volume of contrast has passed beyond the band. Electronically Signed   By: Paulina Fusi M.D.   On: 03/15/2023 11:24   CT CHEST WO CONTRAST Result Date: 03/14/2023 CLINICAL DATA:  Ongoing hypoxia and dyspnea. EXAM: CT CHEST WITHOUT CONTRAST TECHNIQUE: Multidetector CT imaging of the chest was performed following the standard protocol without IV contrast. RADIATION DOSE REDUCTION: This exam was performed according to the departmental dose-optimization program which includes automated exposure control, adjustment of the mA and/or kV according to patient size and/or use of iterative reconstruction technique. COMPARISON:  CT 03/10/2023 FINDINGS: Cardiovascular: On this non IV contrast exam the thoracic aorta is normal course and caliber. There is a  bovine type aortic arch. Heart is nonenlarged. No pericardial effusion. Mediastinum/Nodes: Thyroid gland is incompletely included in the imaging field. On this non IV contrast exam, there is no specific abnormal lymph node enlargement present in the axillary regions, hilum or mediastinum. As seen on previous there is a dilated esophagus diffusely with significant bit luminal debris. There is a gastric band device at the GE junction. Please correlate with clinical symptomatology  and any known history otherwise recommend further workup of this dilatation. Please correlate with functionality of the band. Lungs/Pleura: There is some linear opacity lung bases likely scar or atelectasis. No consolidation, pneumothorax or effusion. No edema. Breathing motion. Upper Abdomen: Adrenal glands are preserved. Small cystic areas once again seen in the liver. Musculoskeletal: Scattered degenerative changes along the spine. Bilateral breast implants. IMPRESSION: Basilar scar or atelectasis.  Breathing motion. Once again there is a dilated esophagus with extensive luminal debris. Gastric band noted. Please correlate with any known history or prior workup as this was seen previously. Please correlate with the functionality of the band as well. Electronically Signed   By: Karen Kays M.D.   On: 03/14/2023 12:56   CT CHEST WO CONTRAST Result Date: 03/10/2023 CLINICAL DATA:  Short of breath, cough and chills, body aches EXAM: CT CHEST WITHOUT CONTRAST TECHNIQUE: Multidetector CT imaging of the chest was performed following the standard protocol without IV contrast. RADIATION DOSE REDUCTION: This exam was performed according to the departmental dose-optimization program which includes automated exposure control, adjustment of the mA and/or kV according to patient size and/or use of iterative reconstruction technique. COMPARISON:  03/10/2023 FINDINGS: Cardiovascular: Unenhanced imaging of the heart is unremarkable without pericardial  effusion. Normal caliber of the thoracic aorta. Evaluation of the vascular lumen is limited without IV contrast. Mediastinum/Nodes: There is diffuse distension of the thoracic esophagus, with marked retained debris within the esophageal lumen. Postsurgical changes are seen from prior gastric band procedure, with small hiatal hernia. Given the distension and debris within the esophagus, there may be functional obstruction at the gastric band site. Further evaluation with nonemergent outpatient esophagram may be useful. The thyroid and trachea are unremarkable.  No pathologic adenopathy. Lungs/Pleura: No acute airspace disease, effusion, or pneumothorax. Central airways are patent. Upper Abdomen: Gastric band as described above. No other acute upper abdominal findings. Musculoskeletal: No acute or destructive bony abnormalities. Reconstructed images demonstrate no additional findings. IMPRESSION: 1. Marked distension of the thoracic esophagus with significant retained debris within the esophageal lumen. This may be due to functional obstruction due to the gastric band in place, and follow-up nonemergent outpatient esophagram may be useful for further evaluation. 2. Small hiatal hernia. 3. No acute intrathoracic process. Electronically Signed   By: Sharlet Salina M.D.   On: 03/10/2023 20:12   DG Chest 2 View Result Date: 03/10/2023 CLINICAL DATA:  Cough, dyspnea with exertion. EXAM: CHEST - 2 VIEW COMPARISON:  Same day. FINDINGS: The heart size and mediastinal contours are within normal limits. Both lungs are clear. The visualized skeletal structures are unremarkable. IMPRESSION: No active cardiopulmonary disease. Electronically Signed   By: Lupita Raider M.D.   On: 03/10/2023 12:22   DG Chest 2 View Result Date: 03/10/2023 CLINICAL DATA:  Cough for 1 month, dyspnea with exertion. EXAM: CHEST - 2 VIEW COMPARISON:  December 16, 2020. FINDINGS: The heart size and mediastinal contours are within normal limits. Both  lungs are clear. The visualized skeletal structures are unremarkable. IMPRESSION: No active cardiopulmonary disease. Electronically Signed   By: Lupita Raider M.D.   On: 03/10/2023 12:20    Microbiology: Recent Results (from the past 240 hours)  Resp panel by RT-PCR (RSV, Flu A&B, Covid) Anterior Nasal Swab     Status: Abnormal   Collection Time: 03/10/23 12:13 PM   Specimen: Anterior Nasal Swab  Result Value Ref Range Status   SARS Coronavirus 2 by RT PCR NEGATIVE NEGATIVE Final    Comment: (  NOTE) SARS-CoV-2 target nucleic acids are NOT DETECTED.  The SARS-CoV-2 RNA is generally detectable in upper respiratory specimens during the acute phase of infection. The lowest concentration of SARS-CoV-2 viral copies this assay can detect is 138 copies/mL. A negative result does not preclude SARS-Cov-2 infection and should not be used as the sole basis for treatment or other patient management decisions. A negative result may occur with  improper specimen collection/handling, submission of specimen other than nasopharyngeal swab, presence of viral mutation(s) within the areas targeted by this assay, and inadequate number of viral copies(<138 copies/mL). A negative result must be combined with clinical observations, patient history, and epidemiological information. The expected result is Negative.  Fact Sheet for Patients:  BloggerCourse.com  Fact Sheet for Healthcare Providers:  SeriousBroker.it  This test is no t yet approved or cleared by the [place] FDA and  has been authorized for detection and/or diagnosis of SARS-CoV-2 by FDA under an Emergency Use Authorization (EUA). This EUA will remain  in effect (meaning this test can be used) for the duration of the COVID-19 declaration under Section 564(b)(1) of the Act, 21 U.S.C.section 360bbb-3(b)(1), unless the authorization is terminated  or revoked sooner.       Influenza A by  PCR NEGATIVE NEGATIVE Final   Influenza B by PCR NEGATIVE NEGATIVE Final    Comment: (NOTE) The Xpert Xpress SARS-CoV-2/FLU/RSV plus assay is intended as an aid in the diagnosis of influenza from Nasopharyngeal swab specimens and should not be used as a sole basis for treatment. Nasal washings and aspirates are unacceptable for Xpert Xpress SARS-CoV-2/FLU/RSV testing.  Fact Sheet for Patients: BloggerCourse.com  Fact Sheet for Healthcare Providers: SeriousBroker.it  This test is not yet approved or cleared by the [place] FDA and has been authorized for detection and/or diagnosis of SARS-CoV-2 by FDA under an Emergency Use Authorization (EUA). This EUA will remain in effect (meaning this test can be used) for the duration of the COVID-19 declaration under Section 564(b)(1) of the Act, 21 U.S.C. section 360bbb-3(b)(1), unless the authorization is terminated or revoked.     Resp Syncytial Virus by PCR POSITIVE (A) NEGATIVE Final    Comment: (NOTE) Fact Sheet for Patients: BloggerCourse.com  Fact Sheet for Healthcare Providers: SeriousBroker.it  This test is not yet approved or cleared by the [place] FDA and has been authorized for detection and/or diagnosis of SARS-CoV-2 by FDA under an Emergency Use Authorization (EUA). This EUA will remain in effect (meaning this test can be used) for the duration of the COVID-19 declaration under Section 564(b)(1) of the Act, 21 U.S.C. section 360bbb-3(b)(1), unless the authorization is terminated or revoked.  Performed at [facility], [address] [place], [place] [address]   Respiratory (~20 pathogens) panel by PCR     Status: Abnormal   Collection Time: 03/11/23 10:50 AM   Specimen: Nasopharyngeal Swab; Respiratory  Result Value Ref Range Status   Adenovirus NOT DETECTED NOT DETECTED Final   Coronavirus 229E  NOT DETECTED NOT DETECTED Final    Comment: (NOTE) The Coronavirus on the Respiratory Panel, DOES NOT test for the novel  Coronavirus (2019 nCoV)    Coronavirus HKU1 NOT DETECTED NOT DETECTED Final   Coronavirus NL63 NOT DETECTED NOT DETECTED Final   Coronavirus OC43 NOT DETECTED NOT DETECTED Final   Metapneumovirus DETECTED (A) NOT DETECTED Final   Rhinovirus / Enterovirus NOT DETECTED NOT DETECTED Final   Influenza A NOT DETECTED NOT DETECTED Final   Influenza B NOT DETECTED NOT  DETECTED Final   Parainfluenza Virus 1 NOT DETECTED NOT DETECTED Final   Parainfluenza Virus 2 NOT DETECTED NOT DETECTED Final   Parainfluenza Virus 3 NOT DETECTED NOT DETECTED Final   Parainfluenza Virus 4 NOT DETECTED NOT DETECTED Final   Respiratory Syncytial Virus DETECTED (A) NOT DETECTED Final   Bordetella pertussis NOT DETECTED NOT DETECTED Final   Bordetella Parapertussis NOT DETECTED NOT DETECTED Final   Chlamydophila pneumoniae NOT DETECTED NOT DETECTED Final   Mycoplasma pneumoniae NOT DETECTED NOT DETECTED Final    Comment: Performed at [facility], [address] [place], [place] [address]     Labs: Basic Metabolic Panel: Recent Labs  Lab 03/11/23 0416 03/12/23 0425 03/13/23 0239 03/14/23 0522 03/15/23 0443  NA 133* 139 139 140 138  K 4.3 4.4 3.9 3.9 3.8  CL 101 106 105 105 105  CO2 21* 27 28 29 28   GLUCOSE 400* 262* 151* 112* 131*  BUN 19 27* 23 24* 26*  CREATININE 0.82 0.75 0.72 0.68 0.72  CALCIUM 8.8* 8.4* 8.4* 8.6* 8.4*   Liver Function Tests: No results for input(s): "AST", "ALT", "ALKPHOS", "BILITOT", "PROT", "ALBUMIN" in the last 168 hours. No results for input(s): "LIPASE", "AMYLASE" in the last 168 hours. No results for input(s): "AMMONIA" in the last 168 hours. CBC: Recent Labs  Lab 03/10/23 1104 03/11/23 0416 03/13/23 0239 03/14/23 0522 03/15/23 0443  WBC 6.2 11.0* 10.2 9.7 9.4  HGB 13.6 14.2 11.8* 11.4* 10.9*  HCT 41.3 42.0 35.6* 34.1* 33.0*  MCV 91.2  87.3 89.4 89.0 89.9  PLT 147* 183 152 149* 155   Cardiac Enzymes: No results for input(s): "CKTOTAL", "CKMB", "CKMBINDEX", "TROPONINI" in the last 168 hours. BNP: BNP (last 3 results) Recent Labs    03/13/23 1508  BNP 57.2    ProBNP (last 3 results) No results for input(s): "PROBNP" in the last 8760 hours.  CBG: Recent Labs  Lab 03/14/23 1132 03/14/23 1711 03/14/23 2110 03/15/23 0741 03/15/23 1153  GLUCAP 190* 298* 250* 97 191*       Signed:  Silvano Bilis MD.  [facility] 03/15/2023, 3:31 PM

## 2023-03-15 NOTE — Progress Notes (Signed)
SATURATION QUALIFICATIONS: (This note is used to comply with regulatory documentation for home oxygen)    Patient Saturations on Room Air at Rest = 94%  Patient Saturations on Room Air while Ambulating = 92%  Patient Saturations on 0 Liters of oxygen while Ambulating = 92%  Please briefly explain why patient needs home oxygen: 

## 2023-03-15 NOTE — Consult Note (Signed)
 PULMONOLOGY         Date: 03/15/2023,   MRN# [id number] Evelyn Shepherd [birth year]     AdmissionWeight: 108.9 kg                 CurrentWeight: 108.9 kg  Referring provider: Dr Ashok Pall   CHIEF COMPLAINT:   Acute hypoxemic respiratory failure   HISTORY OF PRESENT ILLNESS   63 yo with hx of OSA, obesity s/p gastric bipass, lumbago with radiculitis, b12 def came in with continuing dyspnea and hypoxemia after 1 month of illness and failure of outpatient therapy. She is s/p antibiotics and prednisone for cough and DOE.  She had sick contacts at home including her husband who was ill prior to onset of her symptoms.  She has mild OSA with AHI 10 from psg done in 2015 and has ESS of 18. Labwork with chronic stable anemia on cbc and absence of leukocytosis, bmp with mild leukocytosis in context of demargination from glucocoricoid therapy. RVP + for metapneumovirus and RSV.    PAST MEDICAL HISTORY   Past Medical History:  Diagnosis Date   History of depression    History of gastroesophageal reflux (GERD)    History of hyperlipidemia    History of migraine    History of obesity    lap band surgery   History of pneumonia 2005     SURGICAL HISTORY   Past Surgical History:  Procedure Laterality Date   AUGMENTATION MAMMAPLASTY Bilateral 1991   BREAST ENHANCEMENT SURGERY     CESAREAN SECTION     DENTAL SURGERY     LAPAROSCOPIC GASTRIC BANDING  07/31/07   LUMBAR DISC SURGERY     TONGUE SURGERY  03/2005   lesion removal     FAMILY HISTORY   Family History  Problem Relation Age of Onset   Kidney cancer Mother    Diabetes Mother    Heart attack Mother    Pneumonia Father        died   Lung cancer Father    Depression Brother        commited suicide   Alcohol abuse Brother    Breast cancer Paternal Aunt    Breast cancer Paternal Aunt    Early menopause Other        family   Colon cancer Neg Hx    Pancreatic cancer Neg Hx    Rectal cancer Neg Hx    Stomach  cancer Neg Hx      SOCIAL HISTORY   Social History   Tobacco Use   Smoking status: Former    Current packs/day: 0.00    Average packs/day: 1.5 packs/day for 12.0 years (18.0 ttl pk-yrs)    Types: Cigarettes    Start date: 09/25/2005    Quit date: 09/25/2017    Years since quitting: 5.4   Smokeless tobacco: Never  Vaping Use   Vaping status: Every Day  Substance Use Topics   Alcohol use: Yes    Alcohol/week: 0.0 standard drinks of alcohol    Comment: occasional on monthly basis   Drug use: No     MEDICATIONS    Home Medication:  Current Outpatient Rx   Order #: [id number] Class: Normal   Order #: [id number] Class: Normal    Current Medication:  Current Facility-Administered Medications:    acetaminophen (TYLENOL) tablet 650 mg, 650 mg, Oral, Q6H PRN, 650 mg at 03/14/23 0810 **OR** acetaminophen (TYLENOL) suppository 650 mg, 650 mg, Rectal, Q6H PRN, Verdene Lennert, MD  alum & mag hydroxide-simeth (MAALOX/MYLANTA) 200-200-20 MG/5ML suspension 30 mL, 30 mL, Oral, Q4H PRN, Verdene Lennert, MD, 30 mL at 03/13/23 1943   chlorpheniramine-HYDROcodone (TUSSIONEX) 10-8 MG/5ML suspension 5 mL, 5 mL, Oral, Q12H PRN, Verdene Lennert, MD, 5 mL at 03/14/23 2128   enoxaparin (LOVENOX) injection 55 mg, 0.5 mg/kg, Subcutaneous, Q24H, Verdene Lennert, MD, 55 mg at 03/14/23 1756   escitalopram (LEXAPRO) tablet 20 mg, 20 mg, Oral, Daily, Verdene Lennert, MD, 20 mg at 03/14/23 0805   insulin aspart (novoLOG) injection 0-15 Units, 0-15 Units, Subcutaneous, TID WC, Wouk, Wilfred Curtis, MD, 8 Units at 03/14/23 1756   insulin aspart (novoLOG) injection 0-5 Units, 0-5 Units, Subcutaneous, QHS, Wouk, Wilfred Curtis, MD, 2 Units at 03/14/23 2126   ipratropium-albuterol (DUONEB) 0.5-2.5 (3) MG/3ML nebulizer solution 3 mL, 3 mL, Nebulization, TID, Wouk, Wilfred Curtis, MD, 3 mL at 03/15/23 0726   ondansetron (ZOFRAN) tablet 4 mg, 4 mg, Oral, Q6H PRN **OR** ondansetron (ZOFRAN) injection 4 mg, 4 mg, Intravenous,  Q6H PRN, Verdene Lennert, MD, 4 mg at 03/12/23 0105   polyethylene glycol (MIRALAX / GLYCOLAX) packet 17 g, 17 g, Oral, Daily PRN, Verdene Lennert, MD   predniSONE (DELTASONE) tablet 40 mg, 40 mg, Oral, Q breakfast, Verdene Lennert, MD, 40 mg at 03/14/23 0805   pregabalin (LYRICA) capsule 25 mg, 25 mg, Oral, BID, Verdene Lennert, MD, 25 mg at 03/14/23 2127   rosuvastatin (CRESTOR) tablet 10 mg, 10 mg, Oral, Daily, Verdene Lennert, MD, 10 mg at 03/14/23 0805   sodium chloride flush (NS) 0.9 % injection 3 mL, 3 mL, Intravenous, Q12H, Verdene Lennert, MD, 3 mL at 03/14/23 2134    ALLERGIES   Codeine, Meloxicam, and Sulfonamide derivatives     REVIEW OF SYSTEMS    Review of Systems:  Gen:  Denies  fever, sweats, chills weigh loss  HEENT: Denies blurred vision, double vision, ear pain, eye pain, hearing loss, nose bleeds, sore throat Cardiac:  No dizziness, chest pain or heaviness, chest tightness,edema Resp:   reports dyspnea chronically  Gi: Denies swallowing difficulty, stomach pain, nausea or vomiting, diarrhea, constipation, bowel incontinence Gu:  Denies bladder incontinence, burning urine Ext:   Denies Joint pain, stiffness or swelling Skin: Denies  skin rash, easy bruising or bleeding or hives Endoc:  Denies polyuria, polydipsia , polyphagia or weight change Psych:   Denies depression, insomnia or hallucinations   Other:  All other systems negative   VS: BP 100/67 (BP Location: Left Arm)   Pulse 69   Temp 98 F (36.7 C)   Resp 18   Ht 5\' 5"  (1.651 m)   Wt 108.9 kg   SpO2 93%   BMI 39.94 kg/m      PHYSICAL EXAM    GENERAL:NAD, no fevers, chills, no weakness no fatigue HEAD: Normocephalic, atraumatic.  EYES: Pupils equal, round, reactive to light. Extraocular muscles intact. No scleral icterus.  MOUTH: Moist mucosal membrane. Dentition intact. No abscess noted.  EAR, NOSE, THROAT: Clear without exudates. No external lesions.  NECK: Supple. No thyromegaly. No  nodules. No JVD.  PULMONARY: decreased breath sounds with mild rhonchi worse at bases bilaterally.  CARDIOVASCULAR: S1 and S2. Regular rate and rhythm. No murmurs, rubs, or gallops. No edema. Pedal pulses 2+ bilaterally.  GASTROINTESTINAL: Soft, nontender, nondistended. No masses. Positive bowel sounds. No hepatosplenomegaly.  MUSCULOSKELETAL: No swelling, clubbing, or edema. Range of motion full in all extremities.  NEUROLOGIC: Cranial nerves II through XII are intact. No gross focal neurological deficits. Sensation intact. Reflexes  intact.  SKIN: No ulceration, lesions, rashes, or cyanosis. Skin warm and dry. Turgor intact.  PSYCHIATRIC: Mood, affect within normal limits. The patient is awake, alert and oriented x 3. Insight, judgment intact.       IMAGING and LABS      Narrative & Impression  CLINICAL DATA:  Ongoing hypoxia and dyspnea.   EXAM: CT CHEST WITHOUT CONTRAST   TECHNIQUE: Multidetector CT imaging of the chest was performed following the standard protocol without IV contrast.   RADIATION DOSE REDUCTION: This exam was performed according to the departmental dose-optimization program which includes automated exposure control, adjustment of the mA and/or kV according to patient size and/or use of iterative reconstruction technique.   COMPARISON:  CT 03/10/2023   FINDINGS: Cardiovascular: On this non IV contrast exam the thoracic aorta is normal course and caliber. There is a bovine type aortic arch. Heart is nonenlarged. No pericardial effusion.   Mediastinum/Nodes: Thyroid gland is incompletely included in the imaging field. On this non IV contrast exam, there is no specific abnormal lymph node enlargement present in the axillary regions, hilum or mediastinum.   As seen on previous there is a dilated esophagus diffusely with significant bit luminal debris. There is a gastric band device at the GE junction. Please correlate with clinical symptomatology and any  known history otherwise recommend further workup of this dilatation. Please correlate with functionality of the band.   Lungs/Pleura: There is some linear opacity lung bases likely scar or atelectasis. No consolidation, pneumothorax or effusion. No edema. Breathing motion.   Upper Abdomen: Adrenal glands are preserved. Small cystic areas once again seen in the liver.   Musculoskeletal: Scattered degenerative changes along the spine. Bilateral breast implants.   IMPRESSION: Basilar scar or atelectasis.  Breathing motion.   Once again there is a dilated esophagus with extensive luminal debris. Gastric band noted. Please correlate with any known history or prior workup as this was seen previously. Please correlate with the functionality of the band as well.     Electronically Signed   By: Karen Kays M.D.   On: 03/14/2023 12:56      ASSESSMENT/PLAN   Acute hypoxemic respiratory failure -RSV and metapneumovirus induced -she has CT chest with dilated debri filled esophagus may have band dysfunction and would benefit from barium swallow evaluation and GI evaluation.  -s/p CPAP overnight with normoxia this am on room air -currently on prednisone 40mg  daily -reviewed CT chest with no pulmonary infiltrate, edema or effusion.  There are mild atelectatic and bronchitic changes that will get better over next couple weeks with no additional treatment.     Fluid/debri filled dilated esophagus  - patient reports dysphagia to solids and liquids "cannot keep water down"  - this is likely hindering her recovery and contributing to cough  - will order barium swallow and she may need GI evaluation.  She requests that this be evaluated while she's here   OSA - mild AHI 10 - continue autoCPAP           Thank you for allowing me to participate in the care of this patient.   Patient/Family are satisfied with care plan and all questions have been answered.    Provider  disclosure: Patient with at least one acute or chronic illness or injury that poses a threat to life or bodily function and is being managed actively during this encounter.  All of the below services have been performed independently by signing provider:  review of prior  documentation from internal and or external health records.  Review of previous and current lab results.  Interview and comprehensive assessment during patient visit today. Review of current and previous chest radiographs/CT scans. Discussion of management and test interpretation with health care team and patient/family.   This document was prepared using Dragon voice recognition software and may include unintentional dictation errors.     Vida Rigger, M.D.  Division of Pulmonary & Critical Care Medicine

## 2023-03-16 ENCOUNTER — Telehealth: Payer: Self-pay

## 2023-03-16 NOTE — Transitions of Care (Post Inpatient/ED Visit) (Signed)
 03/16/2023  Name: Evelyn Shepherd MRN: [id number] DOB: 03-02-1960  Today's TOC FU Call Status: Today's TOC FU Call Status:: Successful TOC FU Call Completed TOC FU Call Complete Date: 03/16/23 Patient's Name and Date of Birth confirmed.  Transition Care Management Follow-up Telephone Call Date of Discharge: 03/15/23 Discharge Facility: [facility]) Type of Discharge: Inpatient Admission Primary Inpatient Discharge Diagnosis:: RSV, Bronchitis How have you been since you were released from the hospital?: Better Any questions or concerns?: No  Items Reviewed: Did you receive and understand the discharge instructions provided?: Yes Medications obtained,verified, and reconciled?: Yes (Medications Reviewed) Any new allergies since your discharge?: No Dietary orders reviewed?: Yes Type of Diet Ordered:: Low sodium Heart Healthy Do you have support at home?: Yes People in Home: spouse Name of Support/Comfort Primary Source: Shyann Hefner, spouse  Medications Reviewed Today: Medications Reviewed Today     Reviewed by Redge Gainer, RN (Case Manager) on 03/16/23 at 1446  Med List Status: <None>   Medication Order Taking? Sig Documenting Provider Last Dose Status Informant  albuterol (VENTOLIN HFA) 108 (90 Base) MCG/ACT inhaler [id number] No Inhale 2 puffs into the lungs every 6 (six) hours as needed (cough). Defelice, Para March, NP Past Week Active   benzonatate (TESSALON PERLES) 100 MG capsule [id number]  Take 1 capsule (100 mg total) by mouth 3 (three) times daily as needed for cough. Sharman Cheek, MD  Active   cyanocobalamin (VITAMIN B12) 1000 MCG/ML injection [id number] No Inject 1 mL (1,000 mcg total) into the muscle every 30 (thirty) days. Tower, Audrie Gallus, MD Past Month Active   escitalopram (LEXAPRO) 20 MG tablet [id number] No Take 1 tablet (20 mg total) by mouth daily. Tower, Audrie Gallus, MD 03/09/2023 Active   fluticasone (FLONASE) 50 MCG/ACT nasal spray  [id number] No Spray two sprays in each nostril once daily  03/09/2023 Active   ibuprofen (ADVIL) 800 MG tablet [id number] No Take 1 tablet (800 mg total) by mouth every 8 (eight) hours as needed (with food). Tower, Audrie Gallus, MD 03/10/2023 Morning Active   ketoconazole (NIZORAL) 2 % cream [id number] No Apply topically daily. Tower, Audrie Gallus, MD Taking Active   Magnesium Citrate 125 MG CAPS [id number] No Take 1 capsule by mouth in the morning and at bedtime. Tower, Audrie Gallus, MD Taking Active   meclizine (ANTIVERT) 25 MG tablet [id number] No Take 1 tablet (25 mg total) by mouth as needed for dizziness. Tower, Audrie Gallus, MD Taking Active   mometasone (ELOCON) 0.1 % lotion [id number] No Apply to itchy ears twice daily for 12 days.  May repeat as needed for itchy ears  Taking Active   predniSONE (DELTASONE) 10 MG tablet [id number]  Take 3 tablets (30 mg total) by mouth daily for 3 days, THEN 2 tablets (20 mg total) daily for 3 days, THEN 1 tablet (10 mg total) daily for 3 days. Wouk, Wilfred Curtis, MD  Active   pregabalin (LYRICA) 25 MG capsule [id number] No Take 1 capsule (25 mg total) by mouth 2 (two) times daily. Tower, Audrie Gallus, MD 03/09/2023 Active   rosuvastatin (CRESTOR) 10 MG tablet [id number] No Take 1 tablet (10 mg total) by mouth daily. Tower, Audrie Gallus, MD 03/10/2023 Morning Active   SUMAtriptan (IMITREX) 100 MG tablet [id number] No Take 1 tablet (100 mg total) by mouth once as needed for up to 1 dose for headache, can repeat dose in 2 hours if needed, maximum dose of 2 tablets in one day Tower, Audrie Gallus, MD Past Week  Active   Syringe/Needle, Disp, (SYRINGE 3CC/21GX1") 21G X 1" 3 ML MISC [id number] No Use with B12 injections Tower, Audrie Gallus, MD Taking Active   tiZANidine (ZANAFLEX) 2 MG tablet [id number] No Take 1 tablet (2 mg total) by mouth every 8 (eight) hours  Taking Active   triamcinolone cream (KENALOG) 0.1 % [id number] No Apply 1 Application topically 2 (two) times daily as needed (to affected areas). Tower, Audrie Gallus,  MD Taking Active   Vitamin D, Ergocalciferol, (DRISDOL) 1.25 MG (50000 UNIT) CAPS capsule [id number] No Take 1 capsule (50,000 Units total) by mouth every 7 (seven) days. Tower, Audrie Gallus, MD Past Week Active             Home Care and Equipment/Supplies: Were Home Health Services Ordered?: NA Any new equipment or medical supplies ordered?: NA  Functional Questionnaire: Do you need assistance with bathing/showering or dressing?: No Do you need assistance with meal preparation?: No Do you need assistance with eating?: No Do you have difficulty maintaining continence: No Do you need assistance with getting out of bed/getting out of a chair/moving?: No Do you have difficulty managing or taking your medications?: No  Follow up appointments reviewed: PCP Follow-up appointment confirmed?: Yes Date of PCP follow-up appointment?: 03/18/23 Follow-up Provider: [facility] Follow-up appointment confirmed?: NA Do you need transportation to your follow-up appointment?: No Do you understand care options if your condition(s) worsen?: Yes-patient verbalized understanding  SDOH Interventions Today    Flowsheet Row Most Recent Value  SDOH Interventions   Food Insecurity Interventions Intervention Not Indicated  Housing Interventions Intervention Not Indicated  Transportation Interventions Intervention Not Indicated  Utilities Interventions Intervention Not Indicated      Interventions Today    Flowsheet Row Most Recent Value  General Interventions   General Interventions Discussed/Reviewed General Interventions Discussed, General Interventions Reviewed, Doctor Visits  Doctor Visits Discussed/Reviewed Doctor Visits Reviewed  Education Interventions   Education Provided Provided Education  Provided Verbal Education On Medication, When to see the doctor  Pharmacy Interventions   Pharmacy Dicussed/Reviewed Medications and their functions       The patient has been  provided with contact information for the care management team and has been advised to call with any health-related questions or concerns. The patient verbalized understanding with current POC. The patient is directed to their insurance card regarding availability of benefits coverage.  Deidre Ala, BSN, RN [place]  [facility] - [employer] [facility] RN Care Manager [phone]

## 2023-03-17 ENCOUNTER — Telehealth: Payer: Self-pay

## 2023-03-17 NOTE — Telephone Encounter (Signed)
 The patient called in to schedule a follow up from her ED visit. I inform her that we don't have any new patient appointments currently. I went to [facility] to help get this patient a sooner appointment. We schedule her for 04/05/23 at 1:00 pm.

## 2023-03-18 ENCOUNTER — Encounter: Payer: Self-pay | Admitting: Family Medicine

## 2023-03-18 ENCOUNTER — Ambulatory Visit (INDEPENDENT_AMBULATORY_CARE_PROVIDER_SITE_OTHER): Payer: 59 | Admitting: Family Medicine

## 2023-03-18 VITALS — BP 106/72 | HR 70 | Temp 98.3°F | Ht 65.0 in | Wt 239.5 lb

## 2023-03-18 DIAGNOSIS — J205 Acute bronchitis due to respiratory syncytial virus: Secondary | ICD-10-CM | POA: Diagnosis not present

## 2023-03-18 DIAGNOSIS — R8290 Unspecified abnormal findings in urine: Secondary | ICD-10-CM

## 2023-03-18 DIAGNOSIS — J449 Chronic obstructive pulmonary disease, unspecified: Secondary | ICD-10-CM | POA: Diagnosis not present

## 2023-03-18 DIAGNOSIS — R2242 Localized swelling, mass and lump, left lower limb: Secondary | ICD-10-CM

## 2023-03-18 DIAGNOSIS — R7303 Prediabetes: Secondary | ICD-10-CM

## 2023-03-18 DIAGNOSIS — M25551 Pain in right hip: Secondary | ICD-10-CM

## 2023-03-18 DIAGNOSIS — M25552 Pain in left hip: Secondary | ICD-10-CM

## 2023-03-18 DIAGNOSIS — R0602 Shortness of breath: Secondary | ICD-10-CM

## 2023-03-18 DIAGNOSIS — J211 Acute bronchiolitis due to human metapneumovirus: Secondary | ICD-10-CM

## 2023-03-18 DIAGNOSIS — R3129 Other microscopic hematuria: Secondary | ICD-10-CM | POA: Diagnosis not present

## 2023-03-18 DIAGNOSIS — Z9884 Bariatric surgery status: Secondary | ICD-10-CM

## 2023-03-18 LAB — POC URINALSYSI DIPSTICK (AUTOMATED)
Bilirubin, UA: 1
Blood, UA: 25 — AB
Glucose, UA: NEGATIVE
Ketones, UA: NEGATIVE
Leukocytes, UA: NEGATIVE
Nitrite, UA: NEGATIVE
Protein, UA: NEGATIVE
Spec Grav, UA: 1.025 (ref 1.010–1.025)
Urobilinogen, UA: 0.2 U/dL
pH, UA: 6 (ref 5.0–8.0)

## 2023-03-18 LAB — POCT UA - MICROSCOPIC ONLY

## 2023-03-18 LAB — HM MAMMOGRAPHY

## 2023-03-18 NOTE — Patient Instructions (Signed)
 Drink lots of water  Urine is borderline-we will send a sample for culture and see if there is infection   Finish your steroids Glucose will be high until steroids are done   Talk to your surgeon about the possible lipoma  See GI about the lab band slip/esophagus issue   See pulmonary  Get sleep study when able   Use ice for hip bursitis  If not improving -you can see Dr Patsy Lager here ([facility])  See the hanout

## 2023-03-18 NOTE — Progress Notes (Signed)
 Subjective:    Patient ID: Evelyn Shepherd, female    DOB: 11/07/1960, 63 y.o.   MRN: [id number]  HPI  Wt Readings from Last 3 Encounters:  03/18/23 239 lb 8 oz (108.6 kg)  03/10/23 240 lb (108.9 kg)  03/08/23 243 lb 12.8 oz (110.6 kg)   39.85 kg/m  Vitals:   03/18/23 1058  BP: 106/72  Pulse: 70  Temp: 98.3 F (36.8 C)  SpO2: 93%    Pt presents for follow up of hospitalization for RSV bronchitis  Urinary symptoms (cloudy/odor and frequency)  Possible uti Hip pain  Lump on left buttock   Hosp from 2/27 to 3/4 Was originally seen at [facility] and treated with abuterol and prednisone  Helped and then symptoms worsened  Treated in hospital with azithromycin/steroids and 02 and albuterol   CT -nothing focal  No PE noted   Incidental finding on CT -dilated throaci esophagus from alipped lap band   A1c elevated from steroids  Lab Results  Component Value Date   HGBA1C 7.1 (H) 03/10/2023   HGBA1C 6.0 04/05/2022   HGBA1C 7.4 (H) 10/27/2021   Lab Results  Component Value Date   NA 138 03/15/2023   K 3.8 03/15/2023   CO2 28 03/15/2023   GLUCOSE 131 (H) 03/15/2023   BUN 26 (H) 03/15/2023   CREATININE 0.72 03/15/2023   CALCIUM 8.4 (L) 03/15/2023   GFR 92.91 10/20/2022   GFRNONAA >60 03/15/2023   Lab Results  Component Value Date   WBC 9.4 03/15/2023   HGB 10.9 (L) 03/15/2023   HCT 33.0 (L) 03/15/2023   MCV 89.9 03/15/2023   PLT 155 03/15/2023   Also brought form with her - had mammogram and needed [place]  At [facility]   Still gets out of breath easily  Is improving  Still hears some wheezing  Uses her albuterol through the day  Feels like she needs to cough phlegm but there is numb   No fever  Checks pulse ox at home - usually around 91 %  Has pulmonary appointment on Monday  Also setting up for home sleep study   Has appointment with GI upcoming - has appointment on 3/25  Has call in to surgeon also   Urinalysis Results for orders placed or performed in visit  on 03/18/23  POCT Urinalysis Dipstick (Automated)   Collection Time: 03/18/23 11:11 AM  Result Value Ref Range   Color, UA Yellow    Clarity, UA Clear    Glucose, UA Negative Negative   Bilirubin, UA 1 mg/dL    Ketones, UA Negative    Spec Grav, UA 1.025 1.010 - 1.025   Blood, UA 25 Ery/uL (A)    pH, UA 6.0 5.0 - 8.0   Protein, UA Negative Negative   Urobilinogen, UA 0.2 0.2 or 1.0 E.U./dL   Nitrite, UA Negative    Leukocytes, UA Negative Negative  POCT UA - Microscopic Only   Collection Time: 03/18/23 11:30 AM  Result Value Ref Range   WBC, Ur, HPF, POC 3-4 0 - 5   RBC, Urine, Miroscopic 4-5 0 - 2   Bacteria, U Microscopic mod None - Trace   Mucus, UA few    Epithelial cells, urine per micros mod    Crystals, Ur, HPF, POC none    Casts, Ur, LPF, POC none    Yeast, UA none   Urine Culture   Collection Time: 03/18/23 11:40 AM   Specimen: Urine  Result Value Ref Range  MICRO NUMBER: [id number]    SPECIMEN QUALITY: Adequate    Sample Source URINE    STATUS: FINAL    Result: No Growth     No vaginal symptoms  Frequent urination  No visible blood  No dysuria  Abn urine odor    Patient Active Problem List   Diagnosis Date Noted   Greater trochanteric pain syndrome of both lower extremities 03/18/2023   Lump of thigh, left 03/18/2023   Microscopic hematuria 03/18/2023   Acute bronchiolitis due to human metapneumovirus 03/12/2023   RSV bronchitis 03/10/2023   Radiculopathy 03/10/2023   OSA (obstructive sleep apnea) 03/02/2023   Vapes nicotine containing substance 02/27/2023   Chronic obstructive pulmonary disease (HCC) 02/27/2023   Sinus pressure 08/12/2021   Urinary incontinence 01/30/2021   Fibromyalgia 01/30/2021   Need for immunization against influenza 11/04/2020   Vitamin D deficiency 05/06/2020   Hip pain 05/01/2019   Muscle cramping 05/01/2019   Bariatric surgery status 05/01/2019   Urinary frequency 11/23/2018   Screening mammogram, encounter for  11/23/2018   B12 deficiency 11/23/2018   Vertigo 07/17/2018   Former smoker 06/08/2017   History of shingles 01/22/2016   Lumbar pain 12/26/2013   Fatigue 11/07/2013   Encounter for routine gynecological examination 04/11/2013   Colon cancer screening 02/13/2013   Screening for breast cancer 04/13/2011   Special screening for malignant neoplasms, colon 04/13/2011   Hypothyroidism 08/11/2007   OBESITY, MORBID 07/14/2006   LOW BACK PAIN, CHRONIC 07/14/2006   Prediabetes 07/06/2006   Hyperlipidemia 07/06/2006   Depression 07/06/2006   Migraine without aura 07/06/2006   GERD 07/06/2006   Past Medical History:  Diagnosis Date   History of depression    History of gastroesophageal reflux (GERD)    History of hyperlipidemia    History of migraine    History of obesity    lap band surgery   History of pneumonia 2005   Past Surgical History:  Procedure Laterality Date   AUGMENTATION MAMMAPLASTY Bilateral 1991   BREAST ENHANCEMENT SURGERY     CESAREAN SECTION     DENTAL SURGERY     LAPAROSCOPIC GASTRIC BANDING  07/31/07   LUMBAR DISC SURGERY     TONGUE SURGERY  03/2005   lesion removal   Social History   Tobacco Use   Smoking status: Former    Current packs/day: 0.00    Average packs/day: 1.5 packs/day for 12.0 years (18.0 ttl pk-yrs)    Types: Cigarettes    Start date: 09/25/2005    Quit date: 09/25/2017    Years since quitting: 5.4   Smokeless tobacco: Never  Vaping Use   Vaping status: Every Day  Substance Use Topics   Alcohol use: Yes    Alcohol/week: 0.0 standard drinks of alcohol    Comment: occasional on monthly basis   Drug use: No   Family History  Problem Relation Age of Onset   Kidney cancer Mother    Diabetes Mother    Heart attack Mother    Pneumonia Father        died   Lung cancer Father    Depression Brother        commited suicide   Alcohol abuse Brother    Breast cancer Paternal Aunt    Breast cancer Paternal Aunt    Early menopause Other         family   Colon cancer Neg Hx    Pancreatic cancer Neg Hx    Rectal cancer Neg Hx  Stomach cancer Neg Hx    Allergies  Allergen Reactions   Codeine Itching   Meloxicam     REACTION: unspecified   Sulfonamide Derivatives     REACTION: unspecified   Current Outpatient Medications on File Prior to Visit  Medication Sig Dispense Refill   albuterol (VENTOLIN HFA) 108 (90 Base) MCG/ACT inhaler Inhale 2 puffs into the lungs every 6 (six) hours as needed (cough). 6.7 g 0   benzonatate (TESSALON PERLES) 100 MG capsule Take 1 capsule (100 mg total) by mouth 3 (three) times daily as needed for cough. 30 capsule 0   cyanocobalamin (VITAMIN B12) 1000 MCG/ML injection Inject 1 mL (1,000 mcg total) into the muscle every 30 (thirty) days. 1 mL 5   escitalopram (LEXAPRO) 20 MG tablet Take 1 tablet (20 mg total) by mouth daily. 90 tablet 2   fluticasone (FLONASE) 50 MCG/ACT nasal spray Spray two sprays in each nostril once daily 16 g 11   ibuprofen (ADVIL) 800 MG tablet Take 1 tablet (800 mg total) by mouth every 8 (eight) hours as needed (with food). 30 tablet 3   ketoconazole (NIZORAL) 2 % cream Apply topically daily. 60 g 0   Magnesium Citrate 125 MG CAPS Take 1 capsule by mouth in the morning and at bedtime. 180 capsule 3   meclizine (ANTIVERT) 25 MG tablet Take 1 tablet (25 mg total) by mouth as needed for dizziness. 30 tablet 0   mometasone (ELOCON) 0.1 % lotion Apply to itchy ears twice daily for 12 days.  May repeat as needed for itchy ears 60 mL 4   predniSONE (DELTASONE) 10 MG tablet Take 3 tablets (30 mg total) by mouth daily for 3 days, THEN 2 tablets (20 mg total) daily for 3 days, THEN 1 tablet (10 mg total) daily for 3 days. 18 tablet 0   pregabalin (LYRICA) 25 MG capsule Take 1 capsule (25 mg total) by mouth 2 (two) times daily. 60 capsule 3   rosuvastatin (CRESTOR) 10 MG tablet Take 1 tablet (10 mg total) by mouth daily. 90 tablet 3   SUMAtriptan (IMITREX) 100 MG tablet Take 1  tablet (100 mg total) by mouth once as needed for up to 1 dose for headache, can repeat dose in 2 hours if needed, maximum dose of 2 tablets in one day 27 tablet 2   Syringe/Needle, Disp, (SYRINGE 3CC/21GX1") 21G X 1" 3 ML MISC Use with B12 injections 1 each 5   tiZANidine (ZANAFLEX) 2 MG tablet Take 1 tablet (2 mg total) by mouth every 8 (eight) hours 45 tablet 0   triamcinolone cream (KENALOG) 0.1 % Apply 1 Application topically 2 (two) times daily as needed (to affected areas). 30 g 1   Vitamin D, Ergocalciferol, (DRISDOL) 1.25 MG (50000 UNIT) CAPS capsule Take 1 capsule (50,000 Units total) by mouth every 7 (seven) days. 12 capsule 1   No current facility-administered medications on file prior to visit.    Review of Systems  Constitutional:  Positive for fatigue. Negative for activity change, appetite change, fever and unexpected weight change.  HENT:  Negative for congestion, rhinorrhea, sore throat and trouble swallowing.   Eyes:  Negative for pain, redness, itching and visual disturbance.  Respiratory:  Positive for cough and wheezing. Negative for chest tightness and shortness of breath.   Cardiovascular:  Negative for chest pain and palpitations.  Gastrointestinal:  Negative for abdominal pain, blood in stool, constipation, diarrhea and nausea.  Endocrine: Negative for cold intolerance, heat intolerance, polydipsia and  polyuria.  Genitourinary:  Positive for frequency and urgency. Negative for difficulty urinating, dysuria and hematuria.  Musculoskeletal:  Negative for arthralgias, joint swelling and myalgias.  Skin:  Negative for pallor and rash.  Neurological:  Negative for dizziness, tremors, weakness, numbness and headaches.  Hematological:  Negative for adenopathy. Does not bruise/bleed easily.  Psychiatric/Behavioral:  Negative for decreased concentration and dysphoric mood. The patient is not nervous/anxious.        Objective:   Physical Exam Constitutional:      General:  She is not in acute distress.    Appearance: Normal appearance. She is well-developed. She is obese. She is not ill-appearing or diaphoretic.  HENT:     Head: Normocephalic and atraumatic.  Eyes:     Conjunctiva/sclera: Conjunctivae normal.     Pupils: Pupils are equal, round, and reactive to light.  Neck:     Thyroid: No thyromegaly.     Vascular: No carotid bruit or JVD.  Cardiovascular:     Rate and Rhythm: Normal rate and regular rhythm.     Heart sounds: Normal heart sounds.     No gallop.  Pulmonary:     Effort: Pulmonary effort is normal. No respiratory distress.     Breath sounds: Normal breath sounds. No wheezing or rales.  Abdominal:     General: There is no distension or abdominal bruit.     Palpations: Abdomen is soft. There is no mass.     Tenderness: There is no abdominal tenderness.     Comments: No suprapubic tenderness or fullness  No cva tenderness   Musculoskeletal:     Cervical back: Normal range of motion and neck supple.     Right lower leg: No edema.     Left lower leg: No edema.     Comments: Some tenderness of greater troch area bilateral  Normal rom of hips   Lymphadenopathy:     Cervical: No cervical adenopathy.  Skin:    General: Skin is warm and dry.     Coloration: Skin is not pale.     Findings: No rash.     Comments: Soft tissue lump on upper left buttocks Mildly tender Not fluctuant  No color change or warmth  Neurological:     Mental Status: She is alert.     Coordination: Coordination normal.     Deep Tendon Reflexes: Reflexes are normal and symmetric. Reflexes normal.  Psychiatric:        Mood and Affect: Mood normal.           Assessment & Plan:   Problem List Items Addressed This Visit       Respiratory   RSV bronchitis   Reviewed hospital records, lab results and studies in detail  Making clinical improvement  Continues steroid taper and albuterol mdi       Chronic obstructive pulmonary disease (HCC)   Recent  hosp for RSV Reviewed hospital records, lab results and studies in detail   Gradually improving Continues prednisone taper and albuterol   Has appointment with pulmonary  Also for sleep study      Acute bronchiolitis due to human metapneumovirus - Primary   Making gradual improvement after hospitalization  Finishing prednisone taper / using albuterol and done with antibiotic  Pulse ox 93% Not shortness of breath with speech  Reviewed hospital records, lab results and studies in detail    Will update if no further improvement in coming weeks  Call back and Er precautions noted in  detail today          Musculoskeletal and Integument   Greater trochanteric pain syndrome of both lower extremities   Suspect trochanteric bursitis  Tender on exam Given handout Recommend trial of ice  Consider sport med follow up and /or PT if no further improvement          Genitourinary   Microscopic hematuria   May have uti  Has been on antibiotic/steroids for resp illness Glucose is high causing increase frequency  Sent for culture       Relevant Orders   POCT UA - Microscopic Only (Completed)   Urine Culture (Completed)     Other   RESOLVED: Shortness of breath   Prediabetes   Lab Results  Component Value Date   HGBA1C 7.1 (H) 03/10/2023   HGBA1C 6.0 04/05/2022   HGBA1C 7.4 (H) 10/27/2021   Up I suspect from steroids recently for uri  Will want to re check in 3 months       Lump of thigh, left   Soft mass -on left lower buttock  Exam consistent with lipoma  Encouraged pt to address with gen surg when she gets her appointment       Bariatric surgery status   Lap band appears to have slipped on CT causing dilated toracic esophagus  Will follow up with GI as planned  Also working on gen surg appointment for follow up       Other Visit Diagnoses       Abnormal urine odor       Relevant Orders   POCT Urinalysis Dipstick (Automated) (Completed)   POCT UA -  Microscopic Only (Completed)   Urine Culture (Completed)

## 2023-03-19 LAB — URINE CULTURE
MICRO NUMBER:: 16173676
Result:: NO GROWTH
SPECIMEN QUALITY:: ADEQUATE

## 2023-03-20 ENCOUNTER — Encounter: Payer: Self-pay | Admitting: Family Medicine

## 2023-03-20 NOTE — Addendum Note (Signed)
 Addended by: Roxy Manns A on: 03/20/2023 10:25 AM   Modules accepted: Orders

## 2023-03-20 NOTE — Assessment & Plan Note (Signed)
 Soft mass -on left lower buttock  Exam consistent with lipoma  Encouraged pt to address with gen surg when she gets her appointment

## 2023-03-20 NOTE — Assessment & Plan Note (Addendum)
 May have uti  Has been on antibiotic/steroids for resp illness Glucose is high causing increase frequency  Sent for culture    Addendum Neg culture Still small amt of blood on dip and micro Ref to urology

## 2023-03-20 NOTE — Assessment & Plan Note (Addendum)
 Recent hosp for RSV Reviewed hospital records, lab results and studies in detail   Gradually improving Continues prednisone taper and albuterol   Has appointment with pulmonary  Also for sleep study

## 2023-03-20 NOTE — Assessment & Plan Note (Signed)
 Suspect trochanteric bursitis  Tender on exam Given handout Recommend trial of ice  Consider sport med follow up and /or PT if no further improvement

## 2023-03-20 NOTE — Assessment & Plan Note (Signed)
 Lab Results  Component Value Date   HGBA1C 7.1 (H) 03/10/2023   HGBA1C 6.0 04/05/2022   HGBA1C 7.4 (H) 10/27/2021   Up I suspect from steroids recently for uri  Will want to re check in 3 months

## 2023-03-20 NOTE — Assessment & Plan Note (Signed)
 Making gradual improvement after hospitalization  Finishing prednisone taper / using albuterol and done with antibiotic  Pulse ox 93% Not shortness of breath with speech  Reviewed hospital records, lab results and studies in detail    Will update if no further improvement in coming weeks  Call back and Er precautions noted in detail today

## 2023-03-20 NOTE — Assessment & Plan Note (Signed)
 Reviewed hospital records, lab results and studies in detail  Making clinical improvement  Continues steroid taper and albuterol mdi

## 2023-03-20 NOTE — Assessment & Plan Note (Signed)
 Lap band appears to have slipped on CT causing dilated toracic esophagus  Will follow up with GI as planned  Also working on gen surg appointment for follow up

## 2023-03-21 ENCOUNTER — Other Ambulatory Visit: Payer: Self-pay

## 2023-03-21 MED ORDER — DOXYCYCLINE HYCLATE 100 MG PO CAPS
100.0000 mg | ORAL_CAPSULE | Freq: Two times a day (BID) | ORAL | 0 refills | Status: DC
  Filled 2023-03-21: qty 14, 7d supply, fill #0

## 2023-03-21 NOTE — Progress Notes (Signed)
 Spoke to pt's husband, scheduled lab visit 04/01/23

## 2023-04-01 ENCOUNTER — Other Ambulatory Visit (INDEPENDENT_AMBULATORY_CARE_PROVIDER_SITE_OTHER)

## 2023-04-01 ENCOUNTER — Encounter: Payer: Self-pay | Admitting: Family Medicine

## 2023-04-01 DIAGNOSIS — R3129 Other microscopic hematuria: Secondary | ICD-10-CM | POA: Diagnosis not present

## 2023-04-01 LAB — URINALYSIS, ROUTINE W REFLEX MICROSCOPIC
Bilirubin Urine: NEGATIVE
Ketones, ur: NEGATIVE
Leukocytes,Ua: NEGATIVE
Nitrite: NEGATIVE
Specific Gravity, Urine: 1.015 (ref 1.000–1.030)
Total Protein, Urine: NEGATIVE
Urine Glucose: >=1000 — AB
Urobilinogen, UA: 0.2 (ref 0.0–1.0)
pH: 6.5 (ref 5.0–8.0)

## 2023-04-04 NOTE — Addendum Note (Signed)
 Addended by: Roxy Manns A on: 04/04/2023 08:35 AM   Modules accepted: Orders

## 2023-04-05 ENCOUNTER — Ambulatory Visit: Admitting: Gastroenterology

## 2023-04-05 ENCOUNTER — Encounter: Payer: Self-pay | Admitting: Gastroenterology

## 2023-04-05 VITALS — BP 117/76 | HR 86 | Temp 98.3°F | Wt 246.0 lb

## 2023-04-05 DIAGNOSIS — R935 Abnormal findings on diagnostic imaging of other abdominal regions, including retroperitoneum: Secondary | ICD-10-CM

## 2023-04-05 NOTE — Progress Notes (Signed)
 Wyline Mood MD, MRCP(U.K) [address]  [address]  [place], [place] [address]  Main: [phone]  Fax: [phone]   Gastroenterology Consultation  Referring Provider:     Judy Pimple, MD Primary Care Physician:  Tower, Audrie Gallus, MD Primary Gastroenterologist:  Dr. Wyline Mood  Reason for Consultation:    Esophageal stricture        HPI:   Evelyn Shepherd is a 63 y.o. y/o female with a history of sleep apnea morbid obesity was recently admitted to the hospital with shortness of breath cough wheezing.  Treated for bronchitis positive for RSV and metapneumovirus treated with steroids and a course of azithromycin weaned off oxygen before discharge.Underwent a CT scan of the chest incidentally was found to have a dilated esophagus with extensive luminal debris gastric band was noted.  Subsequently she underwent a barium swallow that showed a long segment smooth-walled narrowed to the region of the gastric band and which resulted in the dilated distal esophagus with retained contrast.  Since the patient had a acute respiratory failure it was felt that it would be unsafe to subject the patient to any anesthesia unless absolutely needed and plan was to refer to GI as an outpatient to consider endoscopy evaluation.   03/21/2023: Seen by pulmonology at [facility] clinic they are planning pulmonary function testing she subsequently had an appointment with [facility] surgery in terms of her bariatrics.  7 cc of fluid was taken out from the port of the band plan was to follow-up in 1 month.   Since the fluid was taken out from the band she says that her swallowing is improved but not completely resolved.  She still feels sometimes things get stuck.  She is due to see her surgeon who performed the gastric band surgery soon.  Her breathing has improved she can walk up a flight of stairs but can also get short of breath at times doing minimal work Past Medical History:  Diagnosis Date    History of depression    History of gastroesophageal reflux (GERD)    History of hyperlipidemia    History of migraine    History of obesity    lap band surgery   History of pneumonia 2005    Past Surgical History:  Procedure Laterality Date   AUGMENTATION MAMMAPLASTY Bilateral 1991   BREAST ENHANCEMENT SURGERY     CESAREAN SECTION     DENTAL SURGERY     LAPAROSCOPIC GASTRIC BANDING  07/31/07   LUMBAR DISC SURGERY     TONGUE SURGERY  03/2005   lesion removal    Prior to Admission medications   Medication Sig Start Date End Date Taking? Authorizing Provider  albuterol (VENTOLIN HFA) 108 (90 Base) MCG/ACT inhaler Inhale 2 puffs into the lungs every 6 (six) hours as needed (cough). 02/27/23   Defelice, Para March, NP  benzonatate (TESSALON PERLES) 100 MG capsule Take 1 capsule (100 mg total) by mouth 3 (three) times daily as needed for cough. 03/10/23 03/09/24  Sharman Cheek, MD  cyanocobalamin (VITAMIN B12) 1000 MCG/ML injection Inject 1 mL (1,000 mcg total) into the muscle every 30 (thirty) days. 12/28/22   Tower, Audrie Gallus, MD  escitalopram (LEXAPRO) 20 MG tablet Take 1 tablet (20 mg total) by mouth daily. 08/13/22   Tower, Audrie Gallus, MD  fluticasone Aleda Grana) 50 MCG/ACT nasal spray Spray two sprays in each nostril once daily 09/09/21     ibuprofen (ADVIL) 800 MG tablet Take 1 tablet (800 mg  total) by mouth every 8 (eight) hours as needed (with food). 08/13/22   Tower, Audrie Gallus, MD  ketoconazole (NIZORAL) 2 % cream Apply topically daily. 07/15/21   Tower, Audrie Gallus, MD  Magnesium Citrate 125 MG CAPS Take 1 capsule by mouth in the morning and at bedtime. 06/15/22   Tower, Audrie Gallus, MD  meclizine (ANTIVERT) 25 MG tablet Take 1 tablet (25 mg total) by mouth as needed for dizziness. 07/15/21   Tower, Audrie Gallus, MD  mometasone (ELOCON) 0.1 % lotion Apply to itchy ears twice daily for 12 days.  May repeat as needed for itchy ears 09/09/21     pregabalin (LYRICA) 25 MG capsule Take 1 capsule (25 mg total) by  mouth 2 (two) times daily. 12/01/22   Tower, Audrie Gallus, MD  rosuvastatin (CRESTOR) 10 MG tablet Take 1 tablet (10 mg total) by mouth daily. 06/15/22   Tower, Audrie Gallus, MD  SUMAtriptan (IMITREX) 100 MG tablet Take 1 tablet (100 mg total) by mouth once as needed for up to 1 dose for headache, can repeat dose in 2 hours if needed, maximum dose of 2 tablets in one day 07/14/22   Tower, Audrie Gallus, MD  Syringe/Needle, Disp, (SYRINGE 3CC/21GX1") 21G X 1" 3 ML MISC Use with B12 injections 06/10/22   Tower, Audrie Gallus, MD  tiZANidine (ZANAFLEX) 2 MG tablet Take 1 tablet (2 mg total) by mouth every 8 (eight) hours 12/18/21     triamcinolone cream (KENALOG) 0.1 % Apply 1 Application topically 2 (two) times daily as needed (to affected areas). 12/28/22   Tower, Audrie Gallus, MD  Vitamin D, Ergocalciferol, (DRISDOL) 1.25 MG (50000 UNIT) CAPS capsule Take 1 capsule (50,000 Units total) by mouth every 7 (seven) days. 11/15/22   Tower, Audrie Gallus, MD    Family History  Problem Relation Age of Onset   Kidney cancer Mother    Diabetes Mother    Heart attack Mother    Pneumonia Father        died   Lung cancer Father    Depression Brother        commited suicide   Alcohol abuse Brother    Breast cancer Paternal Aunt    Breast cancer Paternal Aunt    Early menopause Other        family   Colon cancer Neg Hx    Pancreatic cancer Neg Hx    Rectal cancer Neg Hx    Stomach cancer Neg Hx      Social History   Tobacco Use   Smoking status: Former    Current packs/day: 0.00    Average packs/day: 1.5 packs/day for 12.0 years (18.0 ttl pk-yrs)    Types: Cigarettes    Start date: 09/25/2005    Quit date: 09/25/2017    Years since quitting: 5.5   Smokeless tobacco: Never  Vaping Use   Vaping status: Every Day  Substance Use Topics   Alcohol use: Yes    Alcohol/week: 0.0 standard drinks of alcohol    Comment: occasional on monthly basis   Drug use: No    Allergies as of 04/05/2023 - Review Complete 04/05/2023  Allergen  Reaction Noted   Codeine Itching 08/22/2022   Meloxicam  02/10/2006   Sulfonamide derivatives  02/10/2006    Review of Systems:    All systems reviewed and negative except where noted in HPI.   Physical Exam:  BP 117/76   Pulse 86   Temp 98.3 F (36.8 C) (Oral)  Wt 246 lb (111.6 kg)   BMI 40.94 kg/m  No LMP recorded. Patient is postmenopausal. Psych:  Alert and cooperative. Normal mood and affect. General:   Alert,  Well-developed, well-nourished, pleasant and cooperative in NAD Head:  Normocephalic and atraumatic. Eyes:  Sclera clear, no icterus.   Conjunctiva pink. Ears:  Normal auditory acuity. Neck:  Supple; no masses or thyromegaly. Lungs:  Respirations even and unlabored.  Clear throughout to auscultation.   No wheezes, crackles, or rhonchi. No acute distress. Heart:  Regular rate and rhythm; no murmurs, clicks, rubs, or gallops. Abdomen:  Normal bowel sounds.  No bruits.  Soft, non-tender and non-distended without masses, hepatosplenomegaly or hernias noted.  No guarding or rebound tenderness.    Neurologic:  Alert and oriented x3;  grossly normal neurologically. Psych:  Alert and cooperative. Normal mood and affect.  Imaging Studies: DG ESOPHAGUS W SINGLE CM (SOL OR THIN BA) Result Date: 03/15/2023 CLINICAL DATA:  Dysphagia. Globus sensation in both solids and liquids in the chest. History of laparoscopic gastric band in 2009. EXAM: ESOPHAGUS/BARIUM SWALLOW/TABLET STUDY TECHNIQUE: Limited single contrast examination was performed using thin liquid barium. This exam was performed by Brayton El PA-C , and was supervised and interpreted by Dr. Paulina Fusi. FLUOROSCOPY: Radiation Exposure Index (as provided by the fluoroscopic device): 1 minute 42 seconds. 1120.34 micro gray meter squared COMPARISON:  None Available. FINDINGS: Swallowing: Appears normal. No vestibular penetration or aspiration seen. Pharynx: Unremarkable. Esophagus: Dilated and patulous distal esophagus. No  definitive lesions. Apparent long segment smooth walled narrowing through the region of the gastric band. After approximately 6 minutes, only small volume of contrast has passed beyond the band into the stomach. Esophageal motility: Not fully assessed given limited volume of contrast ingested. Gastroesophageal reflux: Not assessed. Ingested 13 mm barium tablet: Not given IMPRESSION: Long segment smooth walled narrowing through the region of the gastric band. This results in dilated patulous distal esophagus with retained contrast. After 6 minutes, only small volume of contrast has passed beyond the band. Electronically Signed   By: Paulina Fusi M.D.   On: 03/15/2023 11:24   CT CHEST WO CONTRAST Result Date: 03/14/2023 CLINICAL DATA:  Ongoing hypoxia and dyspnea. EXAM: CT CHEST WITHOUT CONTRAST TECHNIQUE: Multidetector CT imaging of the chest was performed following the standard protocol without IV contrast. RADIATION DOSE REDUCTION: This exam was performed according to the departmental dose-optimization program which includes automated exposure control, adjustment of the mA and/or kV according to patient size and/or use of iterative reconstruction technique. COMPARISON:  CT 03/10/2023 FINDINGS: Cardiovascular: On this non IV contrast exam the thoracic aorta is normal course and caliber. There is a bovine type aortic arch. Heart is nonenlarged. No pericardial effusion. Mediastinum/Nodes: Thyroid gland is incompletely included in the imaging field. On this non IV contrast exam, there is no specific abnormal lymph node enlargement present in the axillary regions, hilum or mediastinum. As seen on previous there is a dilated esophagus diffusely with significant bit luminal debris. There is a gastric band device at the GE junction. Please correlate with clinical symptomatology and any known history otherwise recommend further workup of this dilatation. Please correlate with functionality of the band. Lungs/Pleura: There  is some linear opacity lung bases likely scar or atelectasis. No consolidation, pneumothorax or effusion. No edema. Breathing motion. Upper Abdomen: Adrenal glands are preserved. Small cystic areas once again seen in the liver. Musculoskeletal: Scattered degenerative changes along the spine. Bilateral breast implants. IMPRESSION: Basilar scar or atelectasis.  Breathing motion.  Once again there is a dilated esophagus with extensive luminal debris. Gastric band noted. Please correlate with any known history or prior workup as this was seen previously. Please correlate with the functionality of the band as well. Electronically Signed   By: Karen Kays M.D.   On: 03/14/2023 12:56   CT CHEST WO CONTRAST Result Date: 03/10/2023 CLINICAL DATA:  Short of breath, cough and chills, body aches EXAM: CT CHEST WITHOUT CONTRAST TECHNIQUE: Multidetector CT imaging of the chest was performed following the standard protocol without IV contrast. RADIATION DOSE REDUCTION: This exam was performed according to the departmental dose-optimization program which includes automated exposure control, adjustment of the mA and/or kV according to patient size and/or use of iterative reconstruction technique. COMPARISON:  03/10/2023 FINDINGS: Cardiovascular: Unenhanced imaging of the heart is unremarkable without pericardial effusion. Normal caliber of the thoracic aorta. Evaluation of the vascular lumen is limited without IV contrast. Mediastinum/Nodes: There is diffuse distension of the thoracic esophagus, with marked retained debris within the esophageal lumen. Postsurgical changes are seen from prior gastric band procedure, with small hiatal hernia. Given the distension and debris within the esophagus, there may be functional obstruction at the gastric band site. Further evaluation with nonemergent outpatient esophagram may be useful. The thyroid and trachea are unremarkable.  No pathologic adenopathy. Lungs/Pleura: No acute airspace  disease, effusion, or pneumothorax. Central airways are patent. Upper Abdomen: Gastric band as described above. No other acute upper abdominal findings. Musculoskeletal: No acute or destructive bony abnormalities. Reconstructed images demonstrate no additional findings. IMPRESSION: 1. Marked distension of the thoracic esophagus with significant retained debris within the esophageal lumen. This may be due to functional obstruction due to the gastric band in place, and follow-up nonemergent outpatient esophagram may be useful for further evaluation. 2. Small hiatal hernia. 3. No acute intrathoracic process. Electronically Signed   By: Sharlet Salina M.D.   On: 03/10/2023 20:12   DG Chest 2 View Result Date: 03/10/2023 CLINICAL DATA:  Cough, dyspnea with exertion. EXAM: CHEST - 2 VIEW COMPARISON:  Same day. FINDINGS: The heart size and mediastinal contours are within normal limits. Both lungs are clear. The visualized skeletal structures are unremarkable. IMPRESSION: No active cardiopulmonary disease. Electronically Signed   By: Lupita Raider M.D.   On: 03/10/2023 12:22   DG Chest 2 View Result Date: 03/10/2023 CLINICAL DATA:  Cough for 1 month, dyspnea with exertion. EXAM: CHEST - 2 VIEW COMPARISON:  December 16, 2020. FINDINGS: The heart size and mediastinal contours are within normal limits. Both lungs are clear. The visualized skeletal structures are unremarkable. IMPRESSION: No active cardiopulmonary disease. Electronically Signed   By: Lupita Raider M.D.   On: 03/10/2023 12:20    Assessment and Plan:   Evelyn Shepherd is a 63 y.o. y/o female with a history of gastric band morbid obesity sleep apnea recent admission on 03/15/2023 for viral pneumonia treated with steroids oxygen antibiotics.  CT scan of the chest found to have dilated esophagus with food debris in it and the barium swallow showed that there was possibly a gastric band which may be causing obstruction to the flow food into the esophagus  as it may have slipped leading to proximal dilation.  She has had the fluid taken out from the band and it has improved with the symptoms but she still has some residual symptoms.  Unclear if this is related to esophageal dysmotility or the presence of the band.  Plan 1.  24 hours of  clears prior to endoscopy evaluation so we have a good look of the esophagus, if no abnormality visibly seen and her symptoms persist will require esophageal manometry to rule out achalasia or esophageal dysmotility.  She is also been advised to follow-up with her bariatric surgeons as well.  We will obtain pulmonary clearance before we perform her upper endoscopy due to recent pneumonia.  Clinically her lungs appear clear and she can walk up a flight of stairs   I have discussed alternative options, risks & benefits,  which include, but are not limited to, bleeding, infection, perforation,respiratory complication & drug reaction.  The patient agrees with this plan & written consent will be obtained.     Follow up in 3 months if not feeling better  Dr Wyline Mood MD,MRCP(U.K)

## 2023-04-09 ENCOUNTER — Other Ambulatory Visit: Payer: Self-pay | Admitting: Family Medicine

## 2023-04-09 MED FILL — Ergocalciferol Cap 1.25 MG (50000 Unit): ORAL | 28 days supply | Qty: 4 | Fill #3 | Status: AC

## 2023-04-09 MED FILL — Sumatriptan Succinate Tab 100 MG: ORAL | 15 days supply | Qty: 9 | Fill #5 | Status: AC

## 2023-04-09 MED FILL — Cyanocobalamin Inj 1000 MCG/ML: INTRAMUSCULAR | 30 days supply | Qty: 1 | Fill #3 | Status: AC

## 2023-04-09 MED FILL — Escitalopram Oxalate Tab 20 MG (Base Equiv): ORAL | 30 days supply | Qty: 30 | Fill #4 | Status: AC

## 2023-04-09 MED FILL — Rosuvastatin Calcium Tab 10 MG: ORAL | 30 days supply | Qty: 30 | Fill #7 | Status: AC

## 2023-04-10 ENCOUNTER — Other Ambulatory Visit: Payer: Self-pay

## 2023-04-10 NOTE — Progress Notes (Deleted)
     Evelyn Carrasco T. Everhett Bozard, MD, CAQ Sports Medicine [facility] at [facility] [address] [place], [address]  Phone: [phone]  FAX: [phone]  Evelyn Shepherd - 63 y.o. female  MRN [id number]  Date of Birth: 07/23/1960  Date: 04/11/2023  PCP: Judy Pimple, MD  Referral: Judy Pimple, MD  No chief complaint on file.  Subjective:   Evelyn Shepherd is a 63 y.o. very pleasant female patient with There is no height or weight on file to calculate BMI. who presents with the following:  The patient presents with what she describes as hip pain.  Hip radiographs from 2021 are essentially normal without any significant degenerative joint disease.    Review of Systems is noted in the HPI, as appropriate  Objective:   There were no vitals taken for this visit.  GEN: No acute distress; alert,appropriate. PULM: Breathing comfortably in no respiratory distress PSYCH: Normally interactive.   Laboratory and Imaging Data:  Assessment and Plan:   ***

## 2023-04-11 ENCOUNTER — Ambulatory Visit: Admitting: Family Medicine

## 2023-04-11 ENCOUNTER — Other Ambulatory Visit: Payer: Self-pay

## 2023-04-11 ENCOUNTER — Encounter: Payer: Self-pay | Admitting: Family Medicine

## 2023-04-11 ENCOUNTER — Ambulatory Visit (INDEPENDENT_AMBULATORY_CARE_PROVIDER_SITE_OTHER)
Admission: RE | Admit: 2023-04-11 | Discharge: 2023-04-11 | Disposition: A | Discharge status: Home / Self Care | Source: Ambulatory Visit | Attending: Family Medicine | Admitting: Family Medicine

## 2023-04-11 VITALS — BP 110/60 | HR 85 | Temp 98.2°F | Ht 65.0 in | Wt 248.2 lb

## 2023-04-11 DIAGNOSIS — M25551 Pain in right hip: Secondary | ICD-10-CM

## 2023-04-11 DIAGNOSIS — M7061 Trochanteric bursitis, right hip: Secondary | ICD-10-CM

## 2023-04-11 DIAGNOSIS — G8929 Other chronic pain: Secondary | ICD-10-CM

## 2023-04-11 DIAGNOSIS — M7062 Trochanteric bursitis, left hip: Secondary | ICD-10-CM | POA: Diagnosis not present

## 2023-04-11 MED ORDER — TRIAMCINOLONE ACETONIDE 40 MG/ML IJ SUSP
40.0000 mg | Freq: Once | INTRAMUSCULAR | Status: AC
  Administered 2023-04-11: 40 mg via INTRA_ARTICULAR

## 2023-04-11 MED ORDER — IBUPROFEN 800 MG PO TABS
800.0000 mg | ORAL_TABLET | Freq: Three times a day (TID) | ORAL | 3 refills | Status: DC | PRN
  Filled 2023-04-11: qty 30, 10d supply, fill #0
  Filled 2023-07-06: qty 30, 10d supply, fill #1
  Filled 2023-08-03: qty 30, 10d supply, fill #2
  Filled 2023-09-08: qty 30, 10d supply, fill #3

## 2023-04-11 NOTE — Progress Notes (Unsigned)
 Calahan Pak T. Marcin Holte, MD, CAQ Sports Medicine [facility] [address] [place], [address]  Phone: [phone]  FAX: [phone]  Evelyn Shepherd - 63 y.o. female  MRN [id number]  Date of Birth: [birth year]  Date: 04/11/2023  PCP: Judy Pimple, MD  Referral: Judy Pimple, MD  Chief Complaint  Patient presents with   Hip Pain    Bilateral Hip    Shoulder Pain    Bilateral    Groin Pain   Subjective:   Evelyn Shepherd is a 63 y.o. very pleasant female patient with Body mass index is 41.31 kg/m. who presents with the following:  The patient presents with what she describes as hip pain.  Hip radiographs from 2021 are essentially normal without any significant degenerative joint disease.  She predominantly has pain in the bilateral lateral hips in the region of the trochanteric bursa.  She also has some mild groin pain that hurts with rotational movements as well as flexion.  She has no prior history of significant trauma, injury, fracture, or operative intervention in the region.  Tylenol and advil has not really helped at all  B GTB inj  Review of Systems is noted in the HPI, as appropriate  Objective:   BP 110/60 (BP Location: Left Arm, Patient Position: Sitting, Cuff Size: Large)   Pulse 85   Temp 98.2 F (36.8 C) (Temporal)   Ht 5\' 5"  (1.651 m)   Wt 248 lb 4 oz (112.6 kg)   SpO2 94%   BMI 41.31 kg/m   GEN: No acute distress; alert,appropriate. PULM: Breathing comfortably in no respiratory distress PSYCH: Normally interactive.    HIP EXAM: SIDE: Bilateral ROM: Abduction, Flexion, Internal and External range of motion: Mild restriction of motion, right greater than left Pain with terminal IROM and EROM: Notable on the right GTB: Acutely tender bilaterally SLR: NEG Knees: No effusion FABER: NT REVERSE FABER: NT, neg Piriformis: NT at direct palpation Str: flexion: 3+/5 abduction: 3+/5 adduction:  3+/5 Strength testing non-tender    Laboratory and Imaging Data:  Assessment and Plan:     ICD-10-CM   1. Trochanteric bursitis of both hips  M70.61 triamcinolone acetonide (KENALOG-40) injection 40 mg   M70.62 triamcinolone acetonide (KENALOG-40) injection 40 mg    2. Chronic right hip pain  M25.551 DG Hip Unilat W OR W/O Pelvis 2-3 Views Right   G89.29      The predominant source of pain is the trochanteric bursa's bilaterally.  She also has some groin pain and on radiographs she has minimal to mild arthritic changes on the right.  She is quite weak, and I gave her a dedicated hip rehab program to work on.  We will do bilateral trochanteric injections today.  Aspiration/Injection Procedure Note Evelyn Shepherd November 17, 1960 Date of procedure: 04/11/2023  Procedure: Large Joint Aspiration / Injection of Hip, Trochanteric Bursa, R Indications: Pain  Procedure Details Verbal consent obtained. Risks, benefits, and alternatives reviewed. Greater trochanter sterilely prepped with Chloraprep. Ethyl Chloride used for anesthesia. 9 cc of Lidocaine 1% injected with 1 mL of Kenalog 40 mg into trochanteric bursa at area of maximal tenderness at greater trochanter. Needle taken to bone to troch bursa, flows easily. Bursa massaged. No bleeding and no complications. Decreased pain after injection. Needle: 22 gauge spinal needle Medication: 1 mL of Kenalog 40 mg   Aspiration/Injection Procedure Note Evelyn Shepherd 1960-09-17 Date of procedure: 04/11/2023  Procedure: Large Joint Aspiration /  Injection of Hip, Trochanteric Bursa, L Indications: Pain  Procedure Details Verbal consent obtained. Risks, benefits, and alternatives reviewed. Greater trochanter sterilely prepped with Chloraprep. Ethyl Chloride used for anesthesia. 9 cc of Lidocaine 1% injected with 1 mL of Kenalog 40 mg into trochanteric bursa at area of maximal tenderness at greater trochanter. Needle taken to bone to troch bursa,  flows easily. Bursa massaged. No bleeding and no complications. Decreased pain after injection. Needle: 22 gauge spinal needle Medication: 1 mL of Kenalog 40 mg   Medication Management during today's office visit: Meds ordered this encounter  Medications   triamcinolone acetonide (KENALOG-40) injection 40 mg   triamcinolone acetonide (KENALOG-40) injection 40 mg   Medications Discontinued During This Encounter  Medication Reason   benzonatate (TESSALON PERLES) 100 MG capsule Completed Course    Orders placed today for conditions managed today: Orders Placed This Encounter  Procedures   DG Hip Unilat W OR W/O Pelvis 2-3 Views Right    Disposition: No follow-ups on file.  [employer] One speech-to-text software was used for transcription in this dictation.  Possible transcriptional errors can occur using Animal nutritionist.   Signed,  Elpidio Galea. Knox Cervi, MD   Outpatient Encounter Medications as of 04/11/2023  Medication Sig   albuterol (VENTOLIN HFA) 108 (90 Base) MCG/ACT inhaler Inhale 2 puffs into the lungs every 6 (six) hours as needed (cough).   cyanocobalamin (VITAMIN B12) 1000 MCG/ML injection Inject 1 mL (1,000 mcg total) into the muscle every 30 (thirty) days.   escitalopram (LEXAPRO) 20 MG tablet Take 1 tablet (20 mg total) by mouth daily.   fluticasone (FLONASE) 50 MCG/ACT nasal spray Spray two sprays in each nostril once daily   ibuprofen (ADVIL) 800 MG tablet Take 1 tablet (800 mg total) by mouth every 8 (eight) hours as needed (with food).   ketoconazole (NIZORAL) 2 % cream Apply topically daily.   Magnesium Citrate 125 MG CAPS Take 1 capsule by mouth in the morning and at bedtime.   meclizine (ANTIVERT) 25 MG tablet Take 1 tablet (25 mg total) by mouth as needed for dizziness.   mometasone (ELOCON) 0.1 % lotion Apply to itchy ears twice daily for 12 days.  May repeat as needed for itchy ears   pregabalin (LYRICA) 25 MG capsule Take 1 capsule (25 mg total) by mouth  2 (two) times daily.   rosuvastatin (CRESTOR) 10 MG tablet Take 1 tablet (10 mg total) by mouth daily.   SUMAtriptan (IMITREX) 100 MG tablet Take 1 tablet (100 mg total) by mouth once as needed for up to 1 dose for headache, can repeat dose in 2 hours if needed, maximum dose of 2 tablets in one day   Syringe/Needle, Disp, (SYRINGE 3CC/21GX1") 21G X 1" 3 ML MISC Use with B12 injections   tiZANidine (ZANAFLEX) 2 MG tablet Take 1 tablet (2 mg total) by mouth every 8 (eight) hours   triamcinolone cream (KENALOG) 0.1 % Apply 1 Application topically 2 (two) times daily as needed (to affected areas).   Vitamin D, Ergocalciferol, (DRISDOL) 1.25 MG (50000 UNIT) CAPS capsule Take 1 capsule (50,000 Units total) by mouth every 7 (seven) days.   [DISCONTINUED] benzonatate (TESSALON PERLES) 100 MG capsule Take 1 capsule (100 mg total) by mouth 3 (three) times daily as needed for cough.   [EXPIRED] triamcinolone acetonide (KENALOG-40) injection 40 mg    [EXPIRED] triamcinolone acetonide (KENALOG-40) injection 40 mg    No facility-administered encounter medications on file as of 04/11/2023.

## 2023-04-11 NOTE — Telephone Encounter (Signed)
 Last filled on 08/13/22 #30 tabs/ 3 refills  F/u scheduled with PCP on 06/20/23

## 2023-04-12 ENCOUNTER — Encounter: Payer: Self-pay | Admitting: Family Medicine

## 2023-04-14 ENCOUNTER — Encounter

## 2023-04-14 DIAGNOSIS — G4733 Obstructive sleep apnea (adult) (pediatric): Secondary | ICD-10-CM

## 2023-04-26 ENCOUNTER — Other Ambulatory Visit: Payer: Self-pay

## 2023-04-26 DIAGNOSIS — R1319 Other dysphagia: Secondary | ICD-10-CM

## 2023-04-27 ENCOUNTER — Encounter: Payer: Self-pay | Admitting: Gastroenterology

## 2023-04-27 ENCOUNTER — Encounter
Admission: RE | Disposition: A | Discharge status: Home / Self Care | Payer: Self-pay | Source: Home / Self Care | Attending: Gastroenterology

## 2023-04-27 ENCOUNTER — Ambulatory Visit
Admission: RE | Admit: 2023-04-27 | Discharge: 2023-04-27 | Disposition: A | Discharge status: Home / Self Care | Source: Home / Self Care | Attending: Gastroenterology | Admitting: Gastroenterology

## 2023-04-27 ENCOUNTER — Ambulatory Visit: Admitting: Anesthesiology

## 2023-04-27 ENCOUNTER — Other Ambulatory Visit: Payer: Self-pay

## 2023-04-27 DIAGNOSIS — Z9884 Bariatric surgery status: Secondary | ICD-10-CM | POA: Insufficient documentation

## 2023-04-27 DIAGNOSIS — K209 Esophagitis, unspecified without bleeding: Secondary | ICD-10-CM

## 2023-04-27 DIAGNOSIS — E66813 Obesity, class 3: Secondary | ICD-10-CM | POA: Diagnosis not present

## 2023-04-27 DIAGNOSIS — Z87891 Personal history of nicotine dependence: Secondary | ICD-10-CM | POA: Insufficient documentation

## 2023-04-27 DIAGNOSIS — Z6841 Body Mass Index (BMI) 40.0 and over, adult: Secondary | ICD-10-CM | POA: Diagnosis not present

## 2023-04-27 DIAGNOSIS — J449 Chronic obstructive pulmonary disease, unspecified: Secondary | ICD-10-CM | POA: Insufficient documentation

## 2023-04-27 DIAGNOSIS — R1310 Dysphagia, unspecified: Secondary | ICD-10-CM | POA: Diagnosis present

## 2023-04-27 DIAGNOSIS — G4730 Sleep apnea, unspecified: Secondary | ICD-10-CM | POA: Insufficient documentation

## 2023-04-27 DIAGNOSIS — K2100 Gastro-esophageal reflux disease with esophagitis, without bleeding: Secondary | ICD-10-CM | POA: Diagnosis not present

## 2023-04-27 DIAGNOSIS — F32A Depression, unspecified: Secondary | ICD-10-CM | POA: Diagnosis not present

## 2023-04-27 DIAGNOSIS — R1319 Other dysphagia: Secondary | ICD-10-CM

## 2023-04-27 HISTORY — PX: ESOPHAGOGASTRODUODENOSCOPY: SHX5428

## 2023-04-27 SURGERY — EGD (ESOPHAGOGASTRODUODENOSCOPY)
Anesthesia: General

## 2023-04-27 MED ORDER — SODIUM CHLORIDE 0.9 % IV SOLN: INTRAVENOUS | Status: DC

## 2023-04-27 MED ORDER — LIDOCAINE HCL (PF) 2 % IJ SOLN
INTRAMUSCULAR | Status: AC
  Filled 2023-04-27: qty 5

## 2023-04-27 MED ORDER — OMEPRAZOLE 40 MG PO CPDR
40.0000 mg | DELAYED_RELEASE_CAPSULE | Freq: Two times a day (BID) | ORAL | 0 refills | Status: AC
  Filled 2023-04-27: qty 60, 30d supply, fill #0
  Filled 2023-05-16 – 2023-06-02 (×3): qty 60, 30d supply, fill #1

## 2023-04-27 MED ORDER — PROPOFOL 1000 MG/100ML IV EMUL
INTRAVENOUS | Status: AC
  Filled 2023-04-27: qty 100

## 2023-04-27 MED ORDER — LIDOCAINE HCL (CARDIAC) PF 100 MG/5ML IV SOSY
PREFILLED_SYRINGE | INTRAVENOUS | Status: DC | PRN
  Administered 2023-04-27: 100 mg via INTRAVENOUS

## 2023-04-27 MED ORDER — PROPOFOL 10 MG/ML IV BOLUS
INTRAVENOUS | Status: DC | PRN
  Administered 2023-04-27 (×2): 40 mg via INTRAVENOUS

## 2023-04-27 MED ORDER — GLYCOPYRROLATE 0.2 MG/ML IJ SOLN
INTRAMUSCULAR | Status: DC | PRN
Start: 2023-04-27 — End: 2023-04-27
  Administered 2023-04-27: .2 mg via INTRAVENOUS

## 2023-04-27 MED ORDER — EPHEDRINE 5 MG/ML INJ
INTRAVENOUS | Status: AC
Start: 2023-04-27 — End: ?
  Filled 2023-04-27: qty 5

## 2023-04-27 MED ORDER — PROPOFOL 500 MG/50ML IV EMUL
INTRAVENOUS | Status: DC | PRN
  Administered 2023-04-27: 75 ug/kg/min via INTRAVENOUS

## 2023-04-27 NOTE — Anesthesia Preprocedure Evaluation (Signed)
 Anesthesia Evaluation  Patient identified by MRN, date of birth, ID band Patient awake    Reviewed: Allergy & Precautions, NPO status , Patient's Chart, lab work & pertinent test results  History of Anesthesia Complications Negative for: history of anesthetic complications  Airway Mallampati: II  TM Distance: >3 FB Neck ROM: Full    Dental no notable dental hx. (+) Teeth Intact   Pulmonary sleep apnea , COPD, Patient abstained from smoking.Not current smoker, former smoker   Pulmonary exam normal breath sounds clear to auscultation       Cardiovascular Exercise Tolerance: Good METS(-) hypertension(-) CAD and (-) Past MI negative cardio ROS (-) dysrhythmias  Rhythm:Regular Rate:Normal - Systolic murmurs    Neuro/Psych  Headaches PSYCHIATRIC DISORDERS  Depression       GI/Hepatic ,GERD  ,,(+)     (-) substance abuse    Endo/Other  neg diabetesHypothyroidism  Class 3 obesity  Renal/GU negative Renal ROS     Musculoskeletal   Abdominal  (+) + obese  Peds  Hematology   Anesthesia Other Findings Past Medical History: No date: History of depression No date: History of gastroesophageal reflux (GERD) No date: History of hyperlipidemia No date: History of migraine No date: History of obesity     Comment:  lap band surgery 2005: History of pneumonia  Reproductive/Obstetrics                             Anesthesia Physical Anesthesia Plan  ASA: 3  Anesthesia Plan: General   Post-op Pain Management: Minimal or no pain anticipated   Induction: Intravenous  PONV Risk Score and Plan: 3 and Propofol infusion, TIVA and Ondansetron  Airway Management Planned: Nasal Cannula  Additional Equipment: None  Intra-op Plan:   Post-operative Plan:   Informed Consent: I have reviewed the patients History and Physical, chart, labs and discussed the procedure including the risks, benefits and  alternatives for the proposed anesthesia with the patient or authorized representative who has indicated his/her understanding and acceptance.     Dental advisory given  Plan Discussed with: CRNA and Surgeon  Anesthesia Plan Comments: (Discussed risks of anesthesia with patient, including possibility of difficulty with spontaneous ventilation under anesthesia necessitating airway intervention, PONV, and rare risks such as cardiac or respiratory or neurological events, and allergic reactions. Discussed the role of CRNA in patient's perioperative care. Patient understands. Patient informed about increased incidence of above perioperative risk due to high BMI. Patient understands. )       Anesthesia Quick Evaluation

## 2023-04-27 NOTE — Op Note (Signed)
 [facility] Gastroenterology Patient Name: Evelyn Shepherd Procedure Date: 04/27/2023 12:34 PM MRN: [id number] Account #: [id number] Date of Birth: [birth year] Admit Type: Outpatient Age: 63 Room: [facility] ENDO ROOM 3 Gender: Female Note Status: Finalized Instrument Name: Patton Salles Endoscope [id number] Procedure:             Upper GI endoscopy Indications:           Dysphagia Providers:             Wyline Mood MD, MD Medicines:             Monitored Anesthesia Care Complications:         No immediate complications. Procedure:             Pre-Anesthesia Assessment:                        - Prior to the procedure, a History and Physical was                         performed, and patient medications, allergies and                         sensitivities were reviewed. The patient's tolerance                         of previous anesthesia was reviewed.                        - The risks and benefits of the procedure and the                         sedation options and risks were discussed with the                         patient. All questions were answered and informed                         consent was obtained.                        - ASA Grade Assessment: II - A patient with mild                         systemic disease.                        After obtaining informed consent, the endoscope was                         passed under direct vision. Throughout the procedure,                         the patient's blood pressure, pulse, and oxygen                         saturations were monitored continuously. The Endoscope                         was introduced through the mouth, and advanced to the  third part of duodenum. The upper GI endoscopy was                         accomplished with ease. The patient tolerated the                         procedure well. Findings:      LA Grade C (one or more mucosal breaks continuous between tops of 2 or       more  mucosal folds, less than 75% circumference) esophagitis with no       bleeding was found in the lower third of the esophagus.      The examined duodenum was normal.      Evidence of a patent vertical banded gastroplasty was found. A gastric       pouch was found. Evidence of a Silastic band was not seen and appeared       intact. This was traversed.      The exam was otherwise without abnormality. Impression:            - LA Grade C esophagitis with no bleeding.                        - Normal examined duodenum.                        - Patent vertical banded gastroplasty and band appears                         intact.                        - The examination was otherwise normal.                        - No specimens collected. Recommendation:        - Discharge patient to home (with escort).                        - Resume previous diet.                        - Continue present medications.                        - Commence on prilosec 40 mg a day for 3 months                        - Repeat upper endoscopy in 8 weeks to evaluate the                         response to therapy.                        - Return to GI office in 3 months. Procedure Code(s):     --- Professional ---                        [phone], Esophagogastroduodenoscopy, flexible,                         transoral; diagnostic, including  collection of                         specimen(s) by brushing or washing, when performed                         (separate procedure) Diagnosis Code(s):     --- Professional ---                        K20.90, Esophagitis, unspecified without bleeding                        Z98.84, Bariatric surgery status                        R13.10, Dysphagia, unspecified CPT copyright 2022 American Medical Association. All rights reserved. The codes documented in this report are preliminary and upon coder review may  be revised to meet current compliance requirements. Luke Salaam, MD Luke Salaam  MD, MD 04/27/2023 12:51:03 PM This report has been signed electronically. Number of Addenda: 0 Note Initiated On: 04/27/2023 12:34 PM Estimated Blood Loss:  Estimated blood loss: none.      [facility]

## 2023-04-27 NOTE — Transfer of Care (Signed)
 Immediate Anesthesia Transfer of Care Note  Patient: Evelyn Shepherd  Procedure(s) Performed: EGD (ESOPHAGOGASTRODUODENOSCOPY)  Patient Location: PACU  Anesthesia Type:General  Level of Consciousness: sedated  Airway & Oxygen Therapy: Patient Spontanous Breathing  Post-op Assessment: Report given to RN and Post -op Vital signs reviewed and stable  Post vital signs: Reviewed and stable  Last Vitals:  Vitals Value Taken Time  BP    Temp 36.1 C 04/27/23 1251  Pulse 69 04/27/23 1251  Resp 14 04/27/23 1251  SpO2      Last Pain:  Vitals:   04/27/23 1251  TempSrc: Temporal  PainSc: Asleep         Complications: No notable events documented.

## 2023-04-27 NOTE — H&P (Signed)
 Wyline Mood, MD [address], [address], [place], [place], [address] [address], [address], [place], [place], [address] Phone: [phone]  Fax: [phone]  Primary Care Physician:  Tower, Audrie Gallus, MD   Pre-Procedure History & Physical: HPI:  Evelyn Shepherd is a 63 y.o. female is here for an endoscopy    Past Medical History:  Diagnosis Date   History of depression    History of gastroesophageal reflux (GERD)    History of hyperlipidemia    History of migraine    History of obesity    lap band surgery   History of pneumonia 2005    Past Surgical History:  Procedure Laterality Date   AUGMENTATION MAMMAPLASTY Bilateral 1991   BREAST ENHANCEMENT SURGERY     CESAREAN SECTION     DENTAL SURGERY     LAPAROSCOPIC GASTRIC BANDING  07/31/07   LUMBAR DISC SURGERY     TONGUE SURGERY  03/2005   lesion removal    Prior to Admission medications   Medication Sig Start Date End Date Taking? Authorizing Provider  cyanocobalamin (VITAMIN B12) 1000 MCG/ML injection Inject 1 mL (1,000 mcg total) into the muscle every 30 (thirty) days. 12/28/22  Yes Tower, Audrie Gallus, MD  escitalopram (LEXAPRO) 20 MG tablet Take 1 tablet (20 mg total) by mouth daily. 08/13/22  Yes Tower, Audrie Gallus, MD  ibuprofen (ADVIL) 800 MG tablet Take 1 tablet (800 mg total) by mouth every 8 (eight) hours as needed (with food). 04/11/23  Yes Tower, Audrie Gallus, MD  pregabalin (LYRICA) 25 MG capsule Take 1 capsule (25 mg total) by mouth 2 (two) times daily. 12/01/22  Yes Tower, Audrie Gallus, MD  rosuvastatin (CRESTOR) 10 MG tablet Take 1 tablet (10 mg total) by mouth daily. 06/15/22  Yes Tower, Audrie Gallus, MD  SUMAtriptan (IMITREX) 100 MG tablet Take 1 tablet (100 mg total) by mouth once as needed for up to 1 dose for headache, can repeat dose in 2 hours if needed, maximum dose of 2 tablets in one day 07/14/22  Yes Tower, Audrie Gallus, MD  tiZANidine (ZANAFLEX) 2 MG tablet Take 1 tablet (2 mg total) by mouth every 8 (eight) hours 12/18/21  Yes    albuterol (VENTOLIN HFA) 108 (90 Base) MCG/ACT inhaler Inhale 2 puffs into the lungs every 6 (six) hours as needed (cough). 02/27/23   Defelice, Para March, NP  fluticasone (FLONASE) 50 MCG/ACT nasal spray Spray two sprays in each nostril once daily 09/09/21     ketoconazole (NIZORAL) 2 % cream Apply topically daily. 07/15/21   Tower, Audrie Gallus, MD  Magnesium Citrate 125 MG CAPS Take 1 capsule by mouth in the morning and at bedtime. 06/15/22   Tower, Audrie Gallus, MD  meclizine (ANTIVERT) 25 MG tablet Take 1 tablet (25 mg total) by mouth as needed for dizziness. 07/15/21   Tower, Audrie Gallus, MD  mometasone (ELOCON) 0.1 % lotion Apply to itchy ears twice daily for 12 days.  May repeat as needed for itchy ears 09/09/21     Syringe/Needle, Disp, (SYRINGE 3CC/21GX1") 21G X 1" 3 ML MISC Use with B12 injections 06/10/22   Tower, Idamae Schuller A, MD  triamcinolone cream (KENALOG) 0.1 % Apply 1 Application topically 2 (two) times daily as needed (to affected areas). 12/28/22   Tower, Audrie Gallus, MD  Vitamin D, Ergocalciferol, (DRISDOL) 1.25 MG (50000 UNIT) CAPS capsule Take 1 capsule (50,000 Units total) by mouth every 7 (seven) days. 11/15/22   Tower, Audrie Gallus, MD  Allergies as of 04/26/2023 - Review Complete 04/12/2023  Allergen Reaction Noted   Codeine Itching 08/22/2022   Meloxicam  02/10/2006   Sulfonamide derivatives  02/10/2006    Family History  Problem Relation Age of Onset   Kidney cancer Mother    Diabetes Mother    Heart attack Mother    Pneumonia Father        died   Lung cancer Father    Depression Brother        commited suicide   Alcohol abuse Brother    Breast cancer Paternal Aunt    Breast cancer Paternal Aunt    Early menopause Other        family   Colon cancer Neg Hx    Pancreatic cancer Neg Hx    Rectal cancer Neg Hx    Stomach cancer Neg Hx     Social History   Socioeconomic History   Marital status: Married    Spouse name: Not on file   Number of children: Not on file   Years of  education: Not on file   Highest education level: Some college, no degree  Occupational History   Not on file  Tobacco Use   Smoking status: Former    Current packs/day: 0.00    Average packs/day: 1.5 packs/day for 12.0 years (18.0 ttl pk-yrs)    Types: Cigarettes    Start date: 09/25/2005    Quit date: 09/25/2017    Years since quitting: 5.5   Smokeless tobacco: Never  Vaping Use   Vaping status: Former   Quit date: 03/15/2023  Substance and Sexual Activity   Alcohol use: Yes    Alcohol/week: 0.0 standard drinks of alcohol    Comment: occasional on monthly basis   Drug use: No   Sexual activity: Yes    Partners: Female    Birth control/protection: Post-menopausal  Other Topics Concern   Not on file  Social History Narrative   Not on file   Social Drivers of Health   Financial Resource Strain: Low Risk  (03/21/2023)   Received from [facility]   Overall Financial Resource Strain (CARDIA)    Difficulty of Paying Living Expenses: Not hard at all  Food Insecurity: No Food Insecurity (03/21/2023)   Received from [facility]   Hunger Vital Sign    Worried About Running Out of Food in the Last Year: Never true    Ran Out of Food in the Last Year: Never true  Transportation Needs: No Transportation Needs (03/21/2023)   Received from [facility] - Transportation    In the past 12 months, has lack of transportation kept you from medical appointments or from getting medications?: No    Lack of Transportation (Non-Medical): No  Physical Activity: Unknown (04/05/2022)   Exercise Vital Sign    Days of Exercise per Week: 0 days    Minutes of Exercise per Session: Not on file  Stress: No Stress Concern Present (04/05/2022)   [employer] of Occupational Health - Occupational Stress Questionnaire    Feeling of Stress : Not at all  Social Connections: Moderately Integrated (04/05/2022)   Social Connection and Isolation  Panel [NHANES]    Frequency of Communication with Friends and Family: More than three times a week    Frequency of Social Gatherings with Friends and Family: Once a week    Attends Religious Services: More than 4 times per year    Active Member  of Clubs or Organizations: No    Attends Banker Meetings: Not on file    Marital Status: Married  Intimate Partner Violence: Not At Risk (03/16/2023)   Humiliation, Afraid, Rape, and Kick questionnaire    Fear of Current or Ex-Partner: No    Emotionally Abused: No    Physically Abused: No    Sexually Abused: No    Review of Systems: See HPI, otherwise negative ROS  Physical Exam: BP 101/68   Pulse 68   Temp (!) 96.9 F (36.1 C) (Temporal)   Resp 16   Ht 5\' 5"  (1.651 m)   Wt 109.3 kg   SpO2 95%   BMI 40.10 kg/m  General:   Alert,  pleasant and cooperative in NAD Head:  Normocephalic and atraumatic. Neck:  Supple; no masses or thyromegaly. Lungs:  Clear throughout to auscultation, normal respiratory effort.    Heart:  +S1, +S2, Regular rate and rhythm, No edema. Abdomen:  Soft, nontender and nondistended. Normal bowel sounds, without guarding, and without rebound.   Neurologic:  Alert and  oriented x4;  grossly normal neurologically.  Impression/Plan: Evelyn Shepherd is here for an endoscopy  to be performed for  evaluation of dysphagia    Risks, benefits, limitations, and alternatives regarding endoscopy have been reviewed with the patient.  Questions have been answered.  All parties agreeable.   Luke Salaam, MD  04/27/2023, 12:35 PM dysp

## 2023-04-28 ENCOUNTER — Encounter: Payer: Self-pay | Admitting: Gastroenterology

## 2023-04-28 NOTE — Anesthesia Postprocedure Evaluation (Signed)
 Anesthesia Post Note  Patient: Evelyn Shepherd  Procedure(s) Performed: EGD (ESOPHAGOGASTRODUODENOSCOPY)  Patient location during evaluation: Endoscopy Anesthesia Type: General Level of consciousness: awake and alert Pain management: pain level controlled Vital Signs Assessment: post-procedure vital signs reviewed and stable Respiratory status: spontaneous breathing, nonlabored ventilation, respiratory function stable and patient connected to nasal cannula oxygen Cardiovascular status: blood pressure returned to baseline and stable Postop Assessment: no apparent nausea or vomiting Anesthetic complications: no   No notable events documented.   Last Vitals:  Vitals:   04/27/23 1301 04/27/23 1315  BP: (!) 92/58 111/72  Pulse: 70 72  Resp: 20 16  Temp:    SpO2: 95% 96%    Last Pain:  Vitals:   04/28/23 0741  TempSrc:   PainSc: 0-No pain                 Lattie Poli

## 2023-05-02 DIAGNOSIS — R069 Unspecified abnormalities of breathing: Secondary | ICD-10-CM | POA: Diagnosis not present

## 2023-05-16 ENCOUNTER — Other Ambulatory Visit: Payer: Self-pay

## 2023-05-16 ENCOUNTER — Other Ambulatory Visit: Payer: Self-pay | Admitting: Family Medicine

## 2023-05-16 MED FILL — Rosuvastatin Calcium Tab 10 MG: ORAL | 30 days supply | Qty: 30 | Fill #8 | Status: AC

## 2023-05-16 MED FILL — Sumatriptan Succinate Tab 100 MG: ORAL | 15 days supply | Qty: 9 | Fill #6 | Status: AC

## 2023-05-16 MED FILL — Escitalopram Oxalate Tab 20 MG (Base Equiv): ORAL | 30 days supply | Qty: 30 | Fill #5 | Status: AC

## 2023-05-16 MED FILL — Ergocalciferol Cap 1.25 MG (50000 Unit): ORAL | 28 days supply | Qty: 4 | Fill #4 | Status: AC

## 2023-05-16 MED FILL — Cyanocobalamin Inj 1000 MCG/ML: INTRAMUSCULAR | 30 days supply | Qty: 1 | Fill #4 | Status: AC

## 2023-05-16 MED FILL — Pregabalin Cap 25 MG: ORAL | 30 days supply | Qty: 60 | Fill #0 | Status: AC

## 2023-05-16 NOTE — Telephone Encounter (Signed)
 Last filled on 12/01/22 #60 caps/ 3 refills   3 month f/u scheduled on 06/20/23

## 2023-05-17 ENCOUNTER — Other Ambulatory Visit: Payer: Self-pay

## 2023-05-18 ENCOUNTER — Ambulatory Visit: Admitting: Urology

## 2023-05-20 ENCOUNTER — Other Ambulatory Visit: Payer: Self-pay

## 2023-05-26 ENCOUNTER — Telehealth: Payer: Self-pay

## 2023-05-26 NOTE — Telephone Encounter (Signed)
 Copied from CRM [id number]. Topic: Clinical - Medical Advice >> May 26, 2023  2:05 PM Hilton Lucky wrote: Reason for CRM: Patient is calling to inquire who she should be following up with, Sleep or PCP, for results of sleep study. Please advise patient.

## 2023-05-30 ENCOUNTER — Other Ambulatory Visit: Payer: Self-pay

## 2023-05-31 ENCOUNTER — Ambulatory Visit: Admitting: Sleep Medicine

## 2023-06-01 ENCOUNTER — Ambulatory Visit: Admitting: Sleep Medicine

## 2023-06-01 ENCOUNTER — Other Ambulatory Visit: Payer: Self-pay

## 2023-06-01 ENCOUNTER — Encounter: Payer: Self-pay | Admitting: Sleep Medicine

## 2023-06-01 VITALS — BP 106/78 | HR 101 | Temp 97.8°F | Ht 65.0 in | Wt 245.8 lb

## 2023-06-01 DIAGNOSIS — G4733 Obstructive sleep apnea (adult) (pediatric): Secondary | ICD-10-CM | POA: Diagnosis not present

## 2023-06-01 DIAGNOSIS — Z87891 Personal history of nicotine dependence: Secondary | ICD-10-CM

## 2023-06-01 DIAGNOSIS — Z6841 Body Mass Index (BMI) 40.0 and over, adult: Secondary | ICD-10-CM | POA: Diagnosis not present

## 2023-06-01 MED ORDER — TIRZEPATIDE-WEIGHT MANAGEMENT 2.5 MG/0.5ML ~~LOC~~ SOAJ
2.5000 mg | SUBCUTANEOUS | 0 refills | Status: DC
  Filled 2023-06-01 – 2023-06-10 (×4): qty 2, 28d supply, fill #0

## 2023-06-01 NOTE — Patient Instructions (Signed)

## 2023-06-01 NOTE — Progress Notes (Signed)
 Name:Evelyn Shepherd MRN: [id number] DOB: 06-09-1960   CHIEF COMPLAINT:  HST F/U   HISTORY OF PRESENT ILLNESS:  Evelyn Shepherd is a 63 y.o. w/ a h/o morbid obesity, GERD, anxiety, depression and hyperlipidemia who presents to follow up on HST results. The patient underwent HST which revealed severe OSA (AHI 31, O2 nadir 73%).   Patient would like to try Zepbound for weight loss. Denies any family or personal h/o thyroid  medullary CA. Also denies any h/o pancreatitis.    EPWORTH SLEEP SCORE 18    03/08/2023    8:00 AM  Results of the Epworth flowsheet  Sitting and reading 3  Watching TV 2  Sitting, inactive in a public place (e.g. a theatre or a meeting) 2  As a passenger in a car for an hour without a break 3  Lying down to rest in the afternoon when circumstances permit 3  Sitting and talking to someone 1  Sitting quietly after a lunch without alcohol 2  In a car, while stopped for a few minutes in traffic 2  Total score 18    PAST MEDICAL HISTORY :   has a past medical history of History of depression, History of gastroesophageal reflux (GERD), History of hyperlipidemia, History of migraine, History of obesity, and History of pneumonia (2005).  has a past surgical history that includes Cesarean section; Breast enhancement surgery; Lumbar disc surgery; Dental surgery; Tongue surgery (03/2005); Laparoscopic gastric banding (07/31/07); Augmentation mammaplasty (Bilateral, 1991); and Esophagogastroduodenoscopy (N/A, 04/27/2023). Prior to Admission medications   Medication Sig Start Date End Date Taking? Authorizing Provider  albuterol  (VENTOLIN  HFA) 108 (90 Base) MCG/ACT inhaler Inhale 2 puffs into the lungs every 6 (six) hours as needed (cough). 02/27/23  Yes Defelice, Eveleen Hinds, NP  cyanocobalamin  (VITAMIN B12) 1000 MCG/ML injection Inject 1 mL (1,000 mcg total) into the muscle every 30 (thirty) days. 12/28/22  Yes Tower, Manley Seeds, MD  escitalopram  (LEXAPRO ) 20 MG tablet Take 1  tablet (20 mg total) by mouth daily. 08/13/22  Yes Tower, Manley Seeds, MD  fluticasone  (FLONASE ) 50 MCG/ACT nasal spray Spray two sprays in each nostril once daily 09/09/21  Yes   ibuprofen  (ADVIL ) 800 MG tablet Take 1 tablet (800 mg total) by mouth every 8 (eight) hours as needed (with food). 04/11/23  Yes Tower, Manley Seeds, MD  ketoconazole  (NIZORAL ) 2 % cream Apply topically daily. 07/15/21  Yes Tower, Manley Seeds, MD  Magnesium  Citrate 125 MG CAPS Take 1 capsule by mouth in the morning and at bedtime. 06/15/22  Yes Tower, Manley Seeds, MD  meclizine  (ANTIVERT ) 25 MG tablet Take 1 tablet (25 mg total) by mouth as needed for dizziness. 07/15/21  Yes Tower, Manley Seeds, MD  mometasone  (ELOCON ) 0.1 % lotion Apply to itchy ears twice daily for 12 days.  May repeat as needed for itchy ears 09/09/21  Yes   omeprazole  (PRILOSEC) 40 MG capsule Take 1 capsule (40 mg total) by mouth in the morning and at bedtime. 04/27/23  Yes Luke Salaam, MD  pregabalin  (LYRICA ) 25 MG capsule Take 1 capsule (25 mg total) by mouth 2 (two) times daily. 05/16/23  Yes Tower, Manley Seeds, MD  rosuvastatin  (CRESTOR ) 10 MG tablet Take 1 tablet (10 mg total) by mouth daily. 06/15/22  Yes Tower, Manley Seeds, MD  SUMAtriptan  (IMITREX ) 100 MG tablet Take 1 tablet (100 mg total) by mouth once as needed for up to 1 dose for headache, can repeat dose in 2 hours if needed,  maximum dose of 2 tablets in one day 07/14/22  Yes Tower, [employer]	[place], MD  Syringe/Needle, Disp, (SYRINGE 3CC/21GX1") 21G X 1" 3 ML MISC Use with B12 injections 06/10/22  Yes Tower, Manley Seeds, MD  triamcinolone  cream (KENALOG ) 0.1 % Apply 1 Application topically 2 (two) times daily as needed (to affected areas). 12/28/22  Yes Tower, Manley Seeds, MD  Vitamin D , Ergocalciferol , (DRISDOL ) 1.25 MG (50000 UNIT) CAPS capsule Take 1 capsule (50,000 Units total) by mouth every 7 (seven) days. 11/15/22  Yes Tower, Manley Seeds, MD   Allergies  Allergen Reactions   Codeine Itching   Meloxicam      REACTION: unspecified   Sulfonamide  Derivatives     REACTION: unspecified    FAMILY HISTORY:  family history includes Alcohol abuse in her brother; Breast cancer in her paternal aunt and paternal aunt; Depression in her brother; Diabetes in her mother; Early menopause in an other family member; Heart attack in her mother; Kidney cancer in her mother; Lung cancer in her father; Pneumonia in her father. SOCIAL HISTORY:  reports that she quit smoking about 5 years ago. Her smoking use included cigarettes. She started smoking about 17 years ago. She has a 18 pack-year smoking history. She has never used smokeless tobacco. She reports current alcohol use. She reports that she does not use drugs.   Review of Systems:  Gen:  Denies  fever, sweats, chills weight loss  HEENT: Denies blurred vision, double vision, ear pain, eye pain, hearing loss, nose bleeds, sore throat Cardiac:  No dizziness, chest pain or heaviness, chest tightness,edema, No JVD Resp:   No cough, -sputum production, -shortness of breath,-wheezing, -hemoptysis,  Gi: Denies swallowing difficulty, stomach pain, nausea or vomiting, diarrhea, constipation, bowel incontinence Gu:  Denies bladder incontinence, burning urine Ext:   Denies Joint pain, stiffness or swelling Skin: Denies  skin rash, easy bruising or bleeding or hives Endoc:  Denies polyuria, polydipsia , polyphagia or weight change Psych:   Denies depression, insomnia or hallucinations  Other:  All other systems negative  VITAL SIGNS: BP 106/78 (BP Location: Right Arm, Patient Position: Sitting, Cuff Size: Large)   Pulse (!) 101   Temp 97.8 F (36.6 C) (Oral)   Ht 5\' 5"  (1.651 m)   Wt 245 lb 12.8 oz (111.5 kg)   SpO2 95%   BMI 40.90 kg/m    Physical Examination:   General Appearance: No distress  EYES PERRLA, EOM intact.   NECK Supple, No JVD Pulmonary: normal breath sounds, No wheezing.  CardiovascularNormal S1,S2.  No m/r/g.   Abdomen: Benign, Soft, non-tender. Skin:   warm, no rashes, no  ecchymosis  Extremities: normal, no cyanosis, clubbing. Neuro:without focal findings,  speech normal  PSYCHIATRIC: Mood, affect within normal limits.   ASSESSMENT AND PLAN  OSA Reviewed HST results with patient. Starting on APAP therapy set to 4-16 cm H2O. Discussed the consequences of untreated sleep apnea. Advised not to drive drowsy for safety of patient and others. Will follow up in 3 months to review CPAP efficacy and compliance data.    Morbid obesity Counseled patient on diet and lifestyle modification. Will also try patient on Zepbound for weight loss and titrate as tolerated. Will follow up in 3 months.    Patient  satisfied with Plan of action and management. All questions answered  I spent a total of 41 minutes reviewing chart data, face-to-face evaluation with the patient, counseling and coordination of care as detailed above.    Rashidah Belleville, M.D.  Sleep Medicine [facility]

## 2023-06-02 ENCOUNTER — Other Ambulatory Visit: Payer: Self-pay

## 2023-06-02 MED FILL — Ergocalciferol Cap 1.25 MG (50000 Unit): ORAL | 28 days supply | Qty: 4 | Fill #5 | Status: CN

## 2023-06-02 MED FILL — Cyanocobalamin Inj 1000 MCG/ML: INTRAMUSCULAR | 30 days supply | Qty: 1 | Fill #5 | Status: CN

## 2023-06-02 MED FILL — Sumatriptan Succinate Tab 100 MG: ORAL | 15 days supply | Qty: 9 | Fill #7 | Status: AC

## 2023-06-02 MED FILL — Pregabalin Cap 25 MG: ORAL | 30 days supply | Qty: 60 | Fill #1 | Status: CN

## 2023-06-02 MED FILL — Rosuvastatin Calcium Tab 10 MG: ORAL | 30 days supply | Qty: 30 | Fill #9 | Status: CN

## 2023-06-03 ENCOUNTER — Ambulatory Visit: Admitting: Family Medicine

## 2023-06-03 ENCOUNTER — Ambulatory Visit
Admission: RE | Admit: 2023-06-03 | Discharge: 2023-06-03 | Disposition: A | Discharge status: Home / Self Care | Source: Ambulatory Visit | Attending: Emergency Medicine | Admitting: Emergency Medicine

## 2023-06-03 ENCOUNTER — Other Ambulatory Visit: Payer: Self-pay

## 2023-06-03 DIAGNOSIS — H9202 Otalgia, left ear: Secondary | ICD-10-CM | POA: Diagnosis not present

## 2023-06-03 MED ORDER — CIPROFLOXACIN-DEXAMETHASONE 0.3-0.1 % OT SUSP
4.0000 [drp] | Freq: Two times a day (BID) | OTIC | 0 refills | Status: DC
  Filled 2023-06-03: qty 7.5, 19d supply, fill #0

## 2023-06-03 NOTE — ED Triage Notes (Signed)
 Triaged by provider

## 2023-06-03 NOTE — ED Provider Notes (Signed)
 Evelyn Shepherd    CSN: [id number] Arrival date & time: 06/03/23  1342      History   Chief Complaint Chief Complaint  Patient presents with   Ear Drainage    Entered by patient    HPI Evelyn Shepherd is a 63 y.o. female.   Patient presents for evaluation of left-sided ear pain present for 2 to 3 days.  Has improved today without treatment.  Denies drainage, fever, congestion, injury or trauma.  Past Medical History:  Diagnosis Date   History of depression    History of gastroesophageal reflux (GERD)    History of hyperlipidemia    History of migraine    History of obesity    lap band surgery   History of pneumonia 2005    Patient Active Problem List   Diagnosis Date Noted   Other dysphagia 04/27/2023   Greater trochanteric pain syndrome of both lower extremities 03/18/2023   Lump of thigh, left 03/18/2023   Microscopic hematuria 03/18/2023   Acute bronchiolitis due to human metapneumovirus 03/12/2023   RSV bronchitis 03/10/2023   Radiculopathy 03/10/2023   OSA (obstructive sleep apnea) 03/02/2023   Vapes nicotine containing substance 02/27/2023   Chronic obstructive pulmonary disease (HCC) 02/27/2023   Sinus pressure 08/12/2021   Urinary incontinence 01/30/2021   Fibromyalgia 01/30/2021   Need for immunization against influenza 11/04/2020   Vitamin D  deficiency 05/06/2020   Hip pain 05/01/2019   Muscle cramping 05/01/2019   Bariatric surgery status 05/01/2019   Urinary frequency 11/23/2018   Screening mammogram, encounter for 11/23/2018   B12 deficiency 11/23/2018   Vertigo 07/17/2018   Former smoker 06/08/2017   History of shingles 01/22/2016   Lumbar pain 12/26/2013   Fatigue 11/07/2013   Encounter for routine gynecological examination 04/11/2013   Colon cancer screening 02/13/2013   Screening for breast cancer 04/13/2011   Special screening for malignant neoplasms, colon 04/13/2011   Hypothyroidism 08/11/2007   OBESITY, MORBID 07/14/2006    LOW BACK PAIN, CHRONIC 07/14/2006   Prediabetes 07/06/2006   Hyperlipidemia 07/06/2006   Depression 07/06/2006   Migraine without aura 07/06/2006   GERD 07/06/2006    Past Surgical History:  Procedure Laterality Date   AUGMENTATION MAMMAPLASTY Bilateral 1991   BREAST ENHANCEMENT SURGERY     CESAREAN SECTION     DENTAL SURGERY     ESOPHAGOGASTRODUODENOSCOPY N/A 04/27/2023   Procedure: EGD (ESOPHAGOGASTRODUODENOSCOPY);  Surgeon: Luke Salaam, MD;  Location: [facility] ENDOSCOPY;  Service: Gastroenterology;  Laterality: N/A;   LAPAROSCOPIC GASTRIC BANDING  07/31/07   LUMBAR DISC SURGERY     TONGUE SURGERY  03/2005   lesion removal    OB History   No obstetric history on file.      Home Medications    Prior to Admission medications   Medication Sig Start Date End Date Taking? Authorizing Provider  ciprofloxacin -dexamethasone (CIPRODEX) OTIC suspension Place 4 drops into the left ear 2 (two) times daily. 06/03/23  Yes Darin Arndt R, NP  albuterol  (VENTOLIN  HFA) 108 (90 Base) MCG/ACT inhaler Inhale 2 puffs into the lungs every 6 (six) hours as needed (cough). 02/27/23   Defelice, Eveleen Hinds, NP  cyanocobalamin  (VITAMIN B12) 1000 MCG/ML injection Inject 1 mL (1,000 mcg total) into the muscle every 30 (thirty) days. 12/28/22   Tower, Manley Seeds, MD  escitalopram  (LEXAPRO ) 20 MG tablet Take 1 tablet (20 mg total) by mouth daily. 08/13/22   Tower, Manley Seeds, MD  fluticasone  (FLONASE ) 50 MCG/ACT nasal spray Spray two sprays in each  nostril once daily 09/09/21     ibuprofen  (ADVIL ) 800 MG tablet Take 1 tablet (800 mg total) by mouth every 8 (eight) hours as needed (with food). 04/11/23   Tower, Manley Seeds, MD  ketoconazole  (NIZORAL ) 2 % cream Apply topically daily. 07/15/21   Tower, Manley Seeds, MD  Magnesium  Citrate 125 MG CAPS Take 1 capsule by mouth in the morning and at bedtime. 06/15/22   Tower, Manley Seeds, MD  meclizine  (ANTIVERT ) 25 MG tablet Take 1 tablet (25 mg total) by mouth as needed for dizziness. 07/15/21    Tower, Manley Seeds, MD  mometasone  (ELOCON ) 0.1 % lotion Apply to itchy ears twice daily for 12 days.  May repeat as needed for itchy ears 09/09/21     omeprazole  (PRILOSEC) 40 MG capsule Take 1 capsule (40 mg total) by mouth in the morning and at bedtime. 04/27/23   Luke Salaam, MD  pregabalin  (LYRICA ) 25 MG capsule Take 1 capsule (25 mg total) by mouth 2 (two) times daily. 05/16/23   Tower, Manley Seeds, MD  rosuvastatin  (CRESTOR ) 10 MG tablet Take 1 tablet (10 mg total) by mouth daily. 06/15/22   Tower, Manley Seeds, MD  SUMAtriptan  (IMITREX ) 100 MG tablet Take 1 tablet (100 mg total) by mouth once as needed for up to 1 dose for headache, can repeat dose in 2 hours if needed, maximum dose of 2 tablets in one day 07/14/22   Tower, Manley Seeds, MD  Syringe/Needle, Disp, (SYRINGE 3CC/21GX1") 21G X 1" 3 ML MISC Use with B12 injections 06/10/22   Tower, Webb A, MD  tirzepatide (ZEPBOUND) 2.5 MG/0.5ML Pen Inject 2.5 mg into the skin once a week. 06/01/23   Reddy, Pallavi D, MD  triamcinolone  cream (KENALOG ) 0.1 % Apply 1 Application topically 2 (two) times daily as needed (to affected areas). 12/28/22   Tower, Manley Seeds, MD  Vitamin D , Ergocalciferol , (DRISDOL ) 1.25 MG (50000 UNIT) CAPS capsule Take 1 capsule (50,000 Units total) by mouth every 7 (seven) days. 11/15/22   Tower, Manley Seeds, MD    Family History Family History  Problem Relation Age of Onset   Kidney cancer Mother    Diabetes Mother    Heart attack Mother    Pneumonia Father        died   Lung cancer Father    Depression Brother        commited suicide   Alcohol abuse Brother    Breast cancer Paternal Aunt    Breast cancer Paternal Aunt    Early menopause Other        family   Colon cancer Neg Hx    Pancreatic cancer Neg Hx    Rectal cancer Neg Hx    Stomach cancer Neg Hx     Social History Social History   Tobacco Use   Smoking status: Former    Current packs/day: 0.00    Average packs/day: 1.5 packs/day for 12.0 years (18.0 ttl pk-yrs)    Types:  Cigarettes    Start date: 09/25/2005    Quit date: 09/25/2017    Years since quitting: 5.6   Smokeless tobacco: Never  Vaping Use   Vaping status: Former   Quit date: 03/15/2023  Substance Use Topics   Alcohol use: Yes    Alcohol/week: 0.0 standard drinks of alcohol    Comment: occasional on monthly basis   Drug use: No     Allergies   Codeine, Meloxicam , and Sulfonamide derivatives   Review of Systems Review of Systems  Physical Exam Triage Vital Signs ED Triage Vitals  Encounter Vitals Group     BP      Systolic BP Percentile      Diastolic BP Percentile      Pulse      Resp      Temp      Temp src      SpO2      Weight      Height      Head Circumference      Peak Flow      Pain Score      Pain Loc      Pain Education      Exclude from Growth Chart    No data found.  Updated Vital Signs There were no vitals taken for this visit.  Visual Acuity Right Eye Distance:   Left Eye Distance:   Bilateral Distance:    Right Eye Near:   Left Eye Near:    Bilateral Near:     Physical Exam Constitutional:      Appearance: Normal appearance.  HENT:     Right Ear: Tympanic membrane, ear canal and external ear normal.     Left Ear: Tympanic membrane, ear canal and external ear normal.  Eyes:     Extraocular Movements: Extraocular movements intact.  Pulmonary:     Effort: Pulmonary effort is normal.  Neurological:     Mental Status: She is alert and oriented to person, place, and time.      UC Treatments / Results  Labs (all labs ordered are listed, but only abnormal results are displayed) Labs Reviewed - No data to display  EKG   Radiology No results found.  Procedures Procedures (including critical care time)  Medications Ordered in UC Medications - No data to display  Initial Impression / Assessment and Plan / UC Course  I have reviewed the triage vital signs and the nursing notes.  Pertinent labs & imaging results that were available  during my care of the patient were reviewed by me and considered in my medical decision making (see chart for details).  Otalgia of the left ear  No abnormality on exam, empirically placed on eardrops, Ciprodex prescribed and discussed administration, alternative etiology related to the sinuses therefore recommended mucolytic's, decongestants, over-the-counter analgesics and warm compresses for supportive care, advised follow-up if symptoms continue to persist Final Clinical Impressions(s) / UC Diagnoses   Final diagnoses:  Otalgia of left ear   Discharge Instructions      Your evaluated for your ear pain, on exam there are no overt signs of infection as your pain is moving into the jaw is most likely related to your sinuses  Empirically placed on eardrops for coverage for germs, place 4 drops of ciprofloxacin  into the left ear every morning and every evening for 7 days, this medicine also has a steroid to help with pain  Begin taking medicine for the sinuses such as Mucinex, pseudoephedrine or similar product, may also take allergy medicine such as Claritin  or Zyrtec  Nasal spray such as Flonase  to help clear out the sinuses  For pain May use Tylenol  and/or Motrin   May hold warm compresses to the external ear  If your symptoms continue to persist you may follow-up with urgent care, your primary doctor or the ear nose and throat specialist whose information is on front page  ED Prescriptions     Medication Sig Dispense Auth. Provider   ciprofloxacin -dexamethasone (CIPRODEX) OTIC suspension Place 4 drops  into the left ear 2 (two) times daily. 7.5 mL Reena Canning, NP      PDMP not reviewed this encounter.   Reena Canning, NP 06/03/23 [phone]

## 2023-06-03 NOTE — Discharge Instructions (Signed)
 Your evaluated for your ear pain, on exam there are no overt signs of infection as your pain is moving into the jaw is most likely related to your sinuses  Empirically placed on eardrops for coverage for germs, place 4 drops of ciprofloxacin  into the left ear every morning and every evening for 7 days, this medicine also has a steroid to help with pain  Begin taking medicine for the sinuses such as Mucinex, pseudoephedrine or similar product, may also take allergy medicine such as Claritin  or Zyrtec  Nasal spray such as Flonase  to help clear out the sinuses  For pain May use Tylenol  and/or Motrin   May hold warm compresses to the external ear  If your symptoms continue to persist you may follow-up with urgent care, your primary doctor or the ear nose and throat specialist whose information is on front page

## 2023-06-08 ENCOUNTER — Telehealth: Payer: Self-pay

## 2023-06-08 ENCOUNTER — Telehealth: Payer: Self-pay | Admitting: *Deleted

## 2023-06-08 ENCOUNTER — Other Ambulatory Visit (HOSPITAL_COMMUNITY): Payer: Self-pay

## 2023-06-08 NOTE — Telephone Encounter (Signed)
 Looking into this- if needed I will start a PA.  PA request has been Received. New Encounter has been or will be created for follow up. For additional info see Pharmacy Prior Auth telephone encounter from 05/28.

## 2023-06-08 NOTE — Telephone Encounter (Unsigned)
 Copied from CRM [id number]. Topic: Clinical - Prescription Issue >> Jun 08, 2023  9:37 AM Juliana Ocean wrote: Reason for CRM: pt is waiting for prior auth on  tirzepatide (ZEPBOUND) 2.5 MG/0.5ML Pen. Can you tell if this has been done?  The pharmacy says they are waiting on the authorization.

## 2023-06-08 NOTE — Telephone Encounter (Signed)
*  Pulm  Pharmacy Patient Advocate Encounter   Received notification from Pt Calls Messages that prior authorization for Zepbound  2.5MG /0.5ML pen-injectors is required/requested.   Insurance verification completed.   The patient is insured through [facility] .   Per test claim: PA required; PA submitted to above mentioned insurance via CoverMyMeds Key/confirmation #/EOC [id number] Status is pending

## 2023-06-09 ENCOUNTER — Other Ambulatory Visit (HOSPITAL_COMMUNITY): Payer: Self-pay

## 2023-06-09 NOTE — Telephone Encounter (Signed)
 Pharmacy Patient Advocate Encounter  Received notification from [employer] that Prior Authorization for Zepbound  2.5MG /0.5ML pen-injectors  has been APPROVED from 06/08/2023 to 12/09/2023. Ran test claim, Copay is $0.00. This test claim was processed through [facility]- copay amounts may vary at other pharmacies due to pharmacy/plan contracts, or as the patient moves through the different stages of their insurance plan.

## 2023-06-10 ENCOUNTER — Other Ambulatory Visit: Payer: Self-pay

## 2023-06-10 MED FILL — Cyanocobalamin Inj 1000 MCG/ML: INTRAMUSCULAR | 30 days supply | Qty: 1 | Fill #5 | Status: AC

## 2023-06-10 MED FILL — Rosuvastatin Calcium Tab 10 MG: ORAL | 30 days supply | Qty: 30 | Fill #9 | Status: AC

## 2023-06-10 MED FILL — Ergocalciferol Cap 1.25 MG (50000 Unit): ORAL | 28 days supply | Qty: 4 | Fill #5 | Status: AC

## 2023-06-14 MED FILL — Pregabalin Cap 25 MG: ORAL | 30 days supply | Qty: 60 | Fill #1 | Status: AC

## 2023-06-15 ENCOUNTER — Ambulatory Visit: Admitting: Urology

## 2023-06-19 ENCOUNTER — Other Ambulatory Visit: Payer: Self-pay | Admitting: Family Medicine

## 2023-06-19 ENCOUNTER — Other Ambulatory Visit: Payer: Self-pay

## 2023-06-19 ENCOUNTER — Ambulatory Visit
Admission: RE | Admit: 2023-06-19 | Discharge: 2023-06-19 | Disposition: A | Discharge status: Home / Self Care | Source: Ambulatory Visit | Attending: Emergency Medicine | Admitting: Emergency Medicine

## 2023-06-19 VITALS — BP 105/74 | HR 79 | Temp 98.2°F | Resp 18

## 2023-06-19 DIAGNOSIS — J209 Acute bronchitis, unspecified: Secondary | ICD-10-CM | POA: Diagnosis not present

## 2023-06-19 DIAGNOSIS — J22 Unspecified acute lower respiratory infection: Secondary | ICD-10-CM

## 2023-06-19 MED ORDER — AMOXICILLIN-POT CLAVULANATE 875-125 MG PO TABS: 1.0000 | ORAL_TABLET | Freq: Two times a day (BID) | ORAL | 0 refills | Status: DC

## 2023-06-19 MED ORDER — ALBUTEROL SULFATE HFA 108 (90 BASE) MCG/ACT IN AERS: 2.0000 | INHALATION_SPRAY | Freq: Four times a day (QID) | RESPIRATORY_TRACT | 2 refills | Status: AC | PRN

## 2023-06-19 MED ORDER — PREDNISONE 10 MG (21) PO TBPK: ORAL_TABLET | Freq: Every day | ORAL | 0 refills | Status: DC

## 2023-06-19 MED FILL — Sumatriptan Succinate Tab 100 MG: ORAL | 15 days supply | Qty: 9 | Fill #8 | Status: AC

## 2023-06-19 MED FILL — Escitalopram Oxalate Tab 20 MG (Base Equiv): ORAL | 30 days supply | Qty: 30 | Fill #6 | Status: AC

## 2023-06-19 MED FILL — Pregabalin Cap 25 MG: ORAL | 30 days supply | Qty: 60 | Fill #2 | Status: CN

## 2023-06-19 NOTE — ED Triage Notes (Signed)
 Pt presents to UC for c/o wheezing x1.5 weeks, worse at night. Using inhaler w/o relief.

## 2023-06-19 NOTE — Discharge Instructions (Signed)
 Begin Augmentin  twice daily for 7 days to cover for bacteria causing symptoms to linger  Begin prednisone  every morning with food to open and relax airway making it easier for you to breathe  Inhaler has been refilled    You can take Tylenol  and/or Ibuprofen  as needed for fever reduction and pain relief.   For cough: honey 1/2 to 1 teaspoon (you can dilute the honey in water or another fluid).  You can also use guaifenesin and dextromethorphan for cough. You can use a humidifier for chest congestion and cough.  If you don't have a humidifier, you can sit in the bathroom with the hot shower running.      For sore throat: try warm salt water gargles, cepacol lozenges, throat spray, warm tea or water with lemon/honey, popsicles or ice, or OTC cold relief medicine for throat discomfort.   For congestion: take a daily anti-histamine like Zyrtec, Claritin , and a oral decongestant, such as pseudoephedrine.  You can also use Flonase  1-2 sprays in each nostril daily.   It is important to stay hydrated: drink plenty of fluids (water, gatorade/powerade/pedialyte, juices, or teas) to keep your throat moisturized and help further relieve irritation/discomfort.

## 2023-06-19 NOTE — ED Provider Notes (Signed)
 [name]    CSN: [id number] Arrival date & time: 06/19/23  1027      History   Chief Complaint Chief Complaint  Patient presents with   Wheezing    Entered by patient    HPI Evelyn Shepherd is a 63 y.o. female.   Patient presents for evaluation of a congested cough, wheezing and intermittent shortness of breath present for 1-1/2 weeks.  Symptoms primarily at nighttime.  Denies fever, ear pain, sore throat, nasal congestion.  Endorses feels similar prior to when she had pneumonia.  Has been using inhaler as well as Flonase .  No known sick contact prior.  Tolerating food and liquids.  Past Medical History:  Diagnosis Date   History of depression    History of gastroesophageal reflux (GERD)    History of hyperlipidemia    History of migraine    History of obesity    lap band surgery   History of pneumonia 2005    Patient Active Problem List   Diagnosis Date Noted   Other dysphagia 04/27/2023   Greater trochanteric pain syndrome of both lower extremities 03/18/2023   Lump of thigh, left 03/18/2023   Microscopic hematuria 03/18/2023   Acute bronchiolitis due to human metapneumovirus 03/12/2023   RSV bronchitis 03/10/2023   Radiculopathy 03/10/2023   OSA (obstructive sleep apnea) 03/02/2023   Vapes nicotine containing substance 02/27/2023   Chronic obstructive pulmonary disease (HCC) 02/27/2023   Sinus pressure 08/12/2021   Urinary incontinence 01/30/2021   Fibromyalgia 01/30/2021   Need for immunization against influenza 11/04/2020   Vitamin D  deficiency 05/06/2020   Hip pain 05/01/2019   Muscle cramping 05/01/2019   Bariatric surgery status 05/01/2019   Urinary frequency 11/23/2018   Screening mammogram, encounter for 11/23/2018   B12 deficiency 11/23/2018   Vertigo 07/17/2018   Former smoker 06/08/2017   History of shingles 01/22/2016   Lumbar pain 12/26/2013   Fatigue 11/07/2013   Encounter for routine gynecological examination 04/11/2013   Colon  cancer screening 02/13/2013   Screening for breast cancer 04/13/2011   Special screening for malignant neoplasms, colon 04/13/2011   Hypothyroidism 08/11/2007   OBESITY, MORBID 07/14/2006   LOW BACK PAIN, CHRONIC 07/14/2006   Prediabetes 07/06/2006   Hyperlipidemia 07/06/2006   Depression 07/06/2006   Migraine without aura 07/06/2006   GERD 07/06/2006    Past Surgical History:  Procedure Laterality Date   AUGMENTATION MAMMAPLASTY Bilateral 1991   BREAST ENHANCEMENT SURGERY     CESAREAN SECTION     DENTAL SURGERY     ESOPHAGOGASTRODUODENOSCOPY N/A 04/27/2023   Procedure: EGD (ESOPHAGOGASTRODUODENOSCOPY);  Surgeon: Luke Salaam, MD;  Location: [facility] ENDOSCOPY;  Service: Gastroenterology;  Laterality: N/A;   LAPAROSCOPIC GASTRIC BANDING  07/31/07   LUMBAR DISC SURGERY     TONGUE SURGERY  03/2005   lesion removal    OB History   No obstetric history on file.      Home Medications    Prior to Admission medications   Medication Sig Start Date End Date Taking? Authorizing Provider  amoxicillin -clavulanate (AUGMENTIN ) 875-125 MG tablet Take 1 tablet by mouth every 12 (twelve) hours. 06/19/23  Yes Olene Godfrey R, NP  predniSONE  (STERAPRED UNI-PAK 21 TAB) 10 MG (21) TBPK tablet Take by mouth daily. Take 6 tabs by mouth daily  for 1 days, then 5 tabs for 1 days, then 4 tabs for 1 days, then 3 tabs for 1 days, 2 tabs for 1 days, then 1 tab by mouth daily for 1 days  06/19/23  Yes Fareeha Evon, Maybelle Spatz, NP  albuterol  (VENTOLIN  HFA) 108 (90 Base) MCG/ACT inhaler Inhale 2 puffs into the lungs every 6 (six) hours as needed (cough). 06/19/23   Reena Canning, NP  ciprofloxacin -dexamethasone  (CIPRODEX ) OTIC suspension Place 4 drops into the left ear 2 (two) times daily. 06/03/23   Cambren Helm, Maybelle Spatz, NP  cyanocobalamin  (VITAMIN B12) 1000 MCG/ML injection Inject 1 mL (1,000 mcg total) into the muscle every 30 (thirty) days. 12/28/22   Tower, Manley Seeds, MD  escitalopram  (LEXAPRO ) 20 MG tablet Take 1 tablet  (20 mg total) by mouth daily. 08/13/22   Tower, Manley Seeds, MD  fluticasone  (FLONASE ) 50 MCG/ACT nasal spray Spray two sprays in each nostril once daily 09/09/21     ibuprofen  (ADVIL ) 800 MG tablet Take 1 tablet (800 mg total) by mouth every 8 (eight) hours as needed (with food). 04/11/23   Tower, Manley Seeds, MD  ketoconazole  (NIZORAL ) 2 % cream Apply topically daily. 07/15/21   Tower, Manley Seeds, MD  Magnesium  Citrate 125 MG CAPS Take 1 capsule by mouth in the morning and at bedtime. 06/15/22   Tower, Manley Seeds, MD  meclizine  (ANTIVERT ) 25 MG tablet Take 1 tablet (25 mg total) by mouth as needed for dizziness. 07/15/21   Tower, Manley Seeds, MD  mometasone  (ELOCON ) 0.1 % lotion Apply to itchy ears twice daily for 12 days.  May repeat as needed for itchy ears 09/09/21     omeprazole  (PRILOSEC) 40 MG capsule Take 1 capsule (40 mg total) by mouth in the morning and at bedtime. 04/27/23   Luke Salaam, MD  pregabalin  (LYRICA ) 25 MG capsule Take 1 capsule (25 mg total) by mouth 2 (two) times daily. 05/16/23   Tower, Manley Seeds, MD  rosuvastatin  (CRESTOR ) 10 MG tablet Take 1 tablet (10 mg total) by mouth daily. 06/15/22   Tower, Manley Seeds, MD  SUMAtriptan  (IMITREX ) 100 MG tablet Take 1 tablet (100 mg total) by mouth once as needed for up to 1 dose for headache, can repeat dose in 2 hours if needed, maximum dose of 2 tablets in one day 07/14/22   Tower, Manley Seeds, MD  Syringe/Needle, Disp, (SYRINGE 3CC/21GX1") 21G X 1" 3 ML MISC Use with B12 injections 06/10/22   Tower, [place] A, MD  tirzepatide  (ZEPBOUND ) 2.5 MG/0.5ML Pen Inject 2.5 mg into the skin once a week. 06/01/23   Reddy, Pallavi D, MD  triamcinolone  cream (KENALOG ) 0.1 % Apply 1 Application topically 2 (two) times daily as needed (to affected areas). 12/28/22   Tower, Manley Seeds, MD  Vitamin D , Ergocalciferol , (DRISDOL ) 1.25 MG (50000 UNIT) CAPS capsule Take 1 capsule (50,000 Units total) by mouth every 7 (seven) days. 11/15/22   Tower, Manley Seeds, MD    Family History Family History  Problem  Relation Age of Onset   Kidney cancer Mother    Diabetes Mother    Heart attack Mother    Pneumonia Father        died   Lung cancer Father    Depression Brother        commited suicide   Alcohol abuse Brother    Breast cancer Paternal Aunt    Breast cancer Paternal Aunt    Early menopause Other        family   Colon cancer Neg Hx    Pancreatic cancer Neg Hx    Rectal cancer Neg Hx    Stomach cancer Neg Hx     Social History Social History  Tobacco Use   Smoking status: Former    Current packs/day: 0.00    Average packs/day: 1.5 packs/day for 12.0 years (18.0 ttl pk-yrs)    Types: Cigarettes    Start date: 09/25/2005    Quit date: 09/25/2017    Years since quitting: 5.7   Smokeless tobacco: Never  Vaping Use   Vaping status: Some Days   Last attempt to quit: 03/15/2023  Substance Use Topics   Alcohol use: Yes    Alcohol/week: 0.0 standard drinks of alcohol    Comment: occasional on monthly basis   Drug use: No     Allergies   Codeine, Meloxicam , and Sulfonamide derivatives   Review of Systems Review of Systems   Physical Exam Triage Vital Signs ED Triage Vitals  Encounter Vitals Group     BP 06/19/23 1036 105/74     Systolic BP Percentile --      Diastolic BP Percentile --      Pulse Rate 06/19/23 1036 79     Resp 06/19/23 1036 18     Temp 06/19/23 1036 98.2 F (36.8 C)     Temp Source 06/19/23 1036 Oral     SpO2 06/19/23 1036 94 %     Weight --      Height --      Head Circumference --      Peak Flow --      Pain Score 06/19/23 1033 0     Pain Loc --      Pain Education --      Exclude from Growth Chart --    No data found.  Updated Vital Signs BP 105/74 (BP Location: Left Arm)   Pulse 79   Temp 98.2 F (36.8 C) (Oral)   Resp 18   SpO2 94%   Visual Acuity Right Eye Distance:   Left Eye Distance:   Bilateral Distance:    Right Eye Near:   Left Eye Near:    Bilateral Near:     Physical Exam Constitutional:      Appearance:  Normal appearance.  Eyes:     Extraocular Movements: Extraocular movements intact.  Cardiovascular:     Rate and Rhythm: Normal rate and regular rhythm.     Pulses: Normal pulses.     Heart sounds: Normal heart sounds.  Pulmonary:     Effort: Pulmonary effort is normal.     Breath sounds: Normal breath sounds.  Neurological:     Mental Status: She is alert and oriented to person, place, and time. Mental status is at baseline.      UC Treatments / Results  Labs (all labs ordered are listed, but only abnormal results are displayed) Labs Reviewed - No data to display  EKG   Radiology No results found.  Procedures Procedures (including critical care time)  Medications Ordered in UC Medications - No data to display  Initial Impression / Assessment and Plan / UC Course  I have reviewed the triage vital signs and the nursing notes.  Pertinent labs & imaging results that were available during my care of the patient were reviewed by me and considered in my medical decision making (see chart for details).  Acute Bronchitis  Patient is in no signs of distress nor toxic appearing.  Vital signs are stable.  Low suspicion for pneumonia therefore will defer imaging.  Testing deferred due to timeline.  Prescribed Augmentin  and prednisone , refilled inhaler.May use additional over-the-counter medications as needed for supportive care.  May follow-up  with urgent care as needed if symptoms persist or worsen.   Final Clinical Impressions(s) / UC Diagnoses   Final diagnoses:  Acute bronchitis, unspecified organism   Discharge Instructions      Begin Augmentin  twice daily for 7 days to cover for bacteria causing symptoms to linger  Begin prednisone  every morning with food to open and relax airway making it easier for you to breathe  Inhaler has been refilled    You can take Tylenol  and/or Ibuprofen  as needed for fever reduction and pain relief.   For cough: honey 1/2 to 1 teaspoon  (you can dilute the honey in water or another fluid).  You can also use guaifenesin and dextromethorphan for cough. You can use a humidifier for chest congestion and cough.  If you don't have a humidifier, you can sit in the bathroom with the hot shower running.      For sore throat: try warm salt water gargles, cepacol lozenges, throat spray, warm tea or water with lemon/honey, popsicles or ice, or OTC cold relief medicine for throat discomfort.   For congestion: take a daily anti-histamine like Zyrtec, Claritin , and a oral decongestant, such as pseudoephedrine.  You can also use Flonase  1-2 sprays in each nostril daily.   It is important to stay hydrated: drink plenty of fluids (water, gatorade/powerade/pedialyte, juices, or teas) to keep your throat moisturized and help further relieve irritation/discomfort.   ED Prescriptions     Medication Sig Dispense Auth. Provider   amoxicillin -clavulanate (AUGMENTIN ) 875-125 MG tablet Take 1 tablet by mouth every 12 (twelve) hours. 14 tablet Montee Tallman R, NP   predniSONE  (STERAPRED UNI-PAK 21 TAB) 10 MG (21) TBPK tablet Take by mouth daily. Take 6 tabs by mouth daily  for 1 days, then 5 tabs for 1 days, then 4 tabs for 1 days, then 3 tabs for 1 days, 2 tabs for 1 days, then 1 tab by mouth daily for 1 days 21 tablet Erik Nessel R, NP   albuterol  (VENTOLIN  HFA) 108 (90 Base) MCG/ACT inhaler Inhale 2 puffs into the lungs every 6 (six) hours as needed (cough). 6.7 g Reena Canning, NP      PDMP not reviewed this encounter.   Reena Canning, NP 06/19/23 1100

## 2023-06-20 ENCOUNTER — Other Ambulatory Visit: Payer: Self-pay

## 2023-06-20 ENCOUNTER — Other Ambulatory Visit: Payer: Self-pay | Admitting: Family Medicine

## 2023-06-20 ENCOUNTER — Ambulatory Visit: Admitting: Family Medicine

## 2023-06-20 ENCOUNTER — Encounter: Payer: Self-pay | Admitting: Family Medicine

## 2023-06-20 VITALS — BP 128/64 | HR 86 | Temp 98.0°F | Ht 65.0 in | Wt 242.4 lb

## 2023-06-20 DIAGNOSIS — E7800 Pure hypercholesterolemia, unspecified: Secondary | ICD-10-CM

## 2023-06-20 DIAGNOSIS — E538 Deficiency of other specified B group vitamins: Secondary | ICD-10-CM

## 2023-06-20 DIAGNOSIS — D649 Anemia, unspecified: Secondary | ICD-10-CM

## 2023-06-20 DIAGNOSIS — G4733 Obstructive sleep apnea (adult) (pediatric): Secondary | ICD-10-CM | POA: Diagnosis not present

## 2023-06-20 DIAGNOSIS — E1165 Type 2 diabetes mellitus with hyperglycemia: Secondary | ICD-10-CM

## 2023-06-20 DIAGNOSIS — E1169 Type 2 diabetes mellitus with other specified complication: Secondary | ICD-10-CM

## 2023-06-20 DIAGNOSIS — E039 Hypothyroidism, unspecified: Secondary | ICD-10-CM

## 2023-06-20 DIAGNOSIS — E785 Hyperlipidemia, unspecified: Secondary | ICD-10-CM

## 2023-06-20 DIAGNOSIS — Z7984 Long term (current) use of oral hypoglycemic drugs: Secondary | ICD-10-CM

## 2023-06-20 DIAGNOSIS — Z7985 Long-term (current) use of injectable non-insulin antidiabetic drugs: Secondary | ICD-10-CM

## 2023-06-20 DIAGNOSIS — J449 Chronic obstructive pulmonary disease, unspecified: Secondary | ICD-10-CM

## 2023-06-20 MED ORDER — METFORMIN HCL 500 MG PO TABS
500.0000 mg | ORAL_TABLET | Freq: Two times a day (BID) | ORAL | 1 refills | Status: DC
  Filled 2023-06-20: qty 60, 30d supply, fill #0
  Filled 2023-07-06 – 2023-07-19 (×2): qty 60, 30d supply, fill #1
  Filled 2023-09-08: qty 60, 30d supply, fill #2
  Filled 2023-09-30 – 2023-10-29 (×2): qty 60, 30d supply, fill #3
  Filled 2023-11-30: qty 60, 30d supply, fill #4
  Filled 2023-12-28: qty 60, 30d supply, fill #5

## 2023-06-20 MED FILL — Cyanocobalamin Inj 1000 MCG/ML: INTRAMUSCULAR | 30 days supply | Qty: 1 | Fill #0 | Status: CN

## 2023-06-20 NOTE — Assessment & Plan Note (Signed)
 Lifelong Also low B12  Labs ordered

## 2023-06-20 NOTE — Assessment & Plan Note (Addendum)
 Episodic wheezing Vapes less frequently now  UC yesterday-prescription prednisone  60 mg taper and augmentin  (reviewed note and plan today) Much improved today Reassuring exam Will finish treatment Aware prednisone  will increase glucose levels

## 2023-06-20 NOTE — Progress Notes (Signed)
 Subjective:    Patient ID: Evelyn Shepherd, female    DOB: 11-09-1960, 63 y.o.   MRN: [id number]  HPI  Wt Readings from Last 3 Encounters:  06/20/23 242 lb 6 oz (109.9 kg)  06/01/23 245 lb 12.8 oz (111.5 kg)  04/27/23 241 lb (109.3 kg)   40.33 kg/m  Vitals:   06/20/23 1211  BP: 128/64  Pulse: 86  Temp: 98 F (36.7 C)  SpO2: 95%   Pt presents for follow up of prediabetes, obesity,osa,  hyperlipidemia and chronic medical problems  Also wheezing   Was seen in UC for wheezing yesterday (in setting of copd)  Dx with acute bronchitis  Put on prednisone  taper 60 mg  Also augmentin      Sees pulmonary for OSA  Uses cpap Was prescription zepbound    Vaping status - every now and then  Not regularly   Thinks she lost 8lb so far     Prediabetes - was up into the DM range   Lab Results  Component Value Date   HGBA1C 7.1 (H) 03/10/2023   HGBA1C 6.0 04/05/2022   HGBA1C 7.4 (H) 10/27/2021  Last check was after steroid course for RSV     Taking zepbound  2.5 mg weekly- 2nd shot last night  Tolerating so far Has already affected appetite   Some mild constipation  Lots of water- with fiber gummy   Eating protein  Lots of veggies   Less junk food  Less bread     Lab Results  Component Value Date   NA 138 03/15/2023   K 3.8 03/15/2023   CO2 28 03/15/2023   GLUCOSE 131 (H) 03/15/2023   BUN 26 (H) 03/15/2023   CREATININE 0.72 03/15/2023   CALCIUM  8.4 (L) 03/15/2023   GFR 92.91 10/20/2022   GFRNONAA >60 03/15/2023   Lab Results  Component Value Date   ALT 17 10/20/2022   AST 14 10/20/2022   ALKPHOS 53 10/20/2022   BILITOT 0.5 10/20/2022   Lab Results  Component Value Date   WBC 9.4 03/15/2023   HGB 10.9 (L) 03/15/2023   HCT 33.0 (L) 03/15/2023   MCV 89.9 03/15/2023   PLT 155 03/15/2023   History of chronic anemia  Lab Results  Component Value Date   IRON 86 07/25/2020    Takes over the counter iron tabs   Also B12 shot monthly  Lab Results   Component Value Date   VITAMINB12 263 04/05/2022   Has had dark urine at times  ? Blood  Has urology appointment later this month   Patient Active Problem List   Diagnosis Date Noted   Anemia 06/20/2023   Other dysphagia 04/27/2023   Greater trochanteric pain syndrome of both lower extremities 03/18/2023   Lump of thigh, left 03/18/2023   Microscopic hematuria 03/18/2023   Radiculopathy 03/10/2023   OSA (obstructive sleep apnea) 03/02/2023   Vapes nicotine containing substance 02/27/2023   Chronic obstructive pulmonary disease (HCC) 02/27/2023   Sinus pressure 08/12/2021   Urinary incontinence 01/30/2021   Fibromyalgia 01/30/2021   Need for immunization against influenza 11/04/2020   Vitamin D  deficiency 05/06/2020   Hip pain 05/01/2019   Muscle cramping 05/01/2019   Bariatric surgery status 05/01/2019   Urinary frequency 11/23/2018   Screening mammogram, encounter for 11/23/2018   B12 deficiency 11/23/2018   Vertigo 07/17/2018   Former smoker 06/08/2017   History of shingles 01/22/2016   Lumbar pain 12/26/2013   Fatigue 11/07/2013   Encounter for routine gynecological examination  04/11/2013   Colon cancer screening 02/13/2013   Screening for breast cancer 04/13/2011   Special screening for malignant neoplasms, colon 04/13/2011   Hypothyroidism 08/11/2007   OBESITY, MORBID 07/14/2006   LOW BACK PAIN, CHRONIC 07/14/2006   Type 2 diabetes mellitus with hyperglycemia (HCC) 07/06/2006   Hyperlipidemia associated with type 2 diabetes mellitus (HCC) 07/06/2006   Depression 07/06/2006   Migraine without aura 07/06/2006   GERD 07/06/2006   Past Medical History:  Diagnosis Date   History of depression    History of gastroesophageal reflux (GERD)    History of hyperlipidemia    History of migraine    History of obesity    lap band surgery   History of pneumonia 2005   Past Surgical History:  Procedure Laterality Date   AUGMENTATION MAMMAPLASTY Bilateral 1991    BREAST ENHANCEMENT SURGERY     CESAREAN SECTION     DENTAL SURGERY     ESOPHAGOGASTRODUODENOSCOPY N/A 04/27/2023   Procedure: EGD (ESOPHAGOGASTRODUODENOSCOPY);  Surgeon: Luke Salaam, MD;  Location: [facility] ENDOSCOPY;  Service: Gastroenterology;  Laterality: N/A;   LAPAROSCOPIC GASTRIC BANDING  07/31/07   LUMBAR DISC SURGERY     TONGUE SURGERY  03/2005   lesion removal   Social History   Tobacco Use   Smoking status: Former    Current packs/day: 0.00    Average packs/day: 1.5 packs/day for 12.0 years (18.0 ttl pk-yrs)    Types: Cigarettes    Start date: 09/25/2005    Quit date: 09/25/2017    Years since quitting: 5.7   Smokeless tobacco: Never  Vaping Use   Vaping status: Some Days   Last attempt to quit: 03/15/2023  Substance Use Topics   Alcohol use: Yes    Alcohol/week: 0.0 standard drinks of alcohol    Comment: occasional on monthly basis   Drug use: No   Family History  Problem Relation Age of Onset   Kidney cancer Mother    Diabetes Mother    Heart attack Mother    Pneumonia Father        died   Lung cancer Father    Depression Brother        commited suicide   Alcohol abuse Brother    Breast cancer Paternal Aunt    Breast cancer Paternal Aunt    Early menopause Other        family   Colon cancer Neg Hx    Pancreatic cancer Neg Hx    Rectal cancer Neg Hx    Stomach cancer Neg Hx    Allergies  Allergen Reactions   Codeine Itching   Meloxicam      REACTION: unspecified   Sulfonamide Derivatives     REACTION: unspecified   Current Outpatient Medications on File Prior to Visit  Medication Sig Dispense Refill   albuterol  (VENTOLIN  HFA) 108 (90 Base) MCG/ACT inhaler Inhale 2 puffs into the lungs every 6 (six) hours as needed (cough). 6.7 g 2   amoxicillin -clavulanate (AUGMENTIN ) 875-125 MG tablet Take 1 tablet by mouth every 12 (twelve) hours. 14 tablet 0   ciprofloxacin -dexamethasone  (CIPRODEX ) OTIC suspension Place 4 drops into the left ear 2 (two) times daily.  7.5 mL 0   cyanocobalamin  (VITAMIN B12) 1000 MCG/ML injection Inject 1 mL (1,000 mcg total) into the muscle every 30 (thirty) days. 1 mL 5   escitalopram  (LEXAPRO ) 20 MG tablet Take 1 tablet (20 mg total) by mouth daily. 90 tablet 2   fluticasone  (FLONASE ) 50 MCG/ACT nasal spray Spray two sprays  in each nostril once daily 16 g 11   ibuprofen  (ADVIL ) 800 MG tablet Take 1 tablet (800 mg total) by mouth every 8 (eight) hours as needed (with food). 30 tablet 3   ketoconazole  (NIZORAL ) 2 % cream Apply topically daily. 60 g 0   Magnesium  Citrate 125 MG CAPS Take 1 capsule by mouth in the morning and at bedtime. 180 capsule 3   meclizine  (ANTIVERT ) 25 MG tablet Take 1 tablet (25 mg total) by mouth as needed for dizziness. 30 tablet 0   mometasone  (ELOCON ) 0.1 % lotion Apply to itchy ears twice daily for 12 days.  May repeat as needed for itchy ears 60 mL 4   omeprazole  (PRILOSEC) 40 MG capsule Take 1 capsule (40 mg total) by mouth in the morning and at bedtime. 180 capsule 0   predniSONE  (STERAPRED UNI-PAK 21 TAB) 10 MG (21) TBPK tablet Take by mouth daily. Take 6 tabs by mouth daily  for 1 days, then 5 tabs for 1 days, then 4 tabs for 1 days, then 3 tabs for 1 days, 2 tabs for 1 days, then 1 tab by mouth daily for 1 days 21 tablet 0   pregabalin  (LYRICA ) 25 MG capsule Take 1 capsule (25 mg total) by mouth 2 (two) times daily. 60 capsule 3   rosuvastatin  (CRESTOR ) 10 MG tablet Take 1 tablet (10 mg total) by mouth daily. 90 tablet 3   SUMAtriptan  (IMITREX ) 100 MG tablet Take 1 tablet (100 mg total) by mouth once as needed for up to 1 dose for headache, can repeat dose in 2 hours if needed, maximum dose of 2 tablets in one day 27 tablet 2   Syringe/Needle, Disp, (SYRINGE 3CC/21GX1") 21G X 1" 3 ML MISC Use with B12 injections 1 each 5   tirzepatide  (ZEPBOUND ) 2.5 MG/0.5ML Pen Inject 2.5 mg into the skin once a week. 2 mL 0   triamcinolone  cream (KENALOG ) 0.1 % Apply 1 Application topically 2 (two) times daily  as needed (to affected areas). 30 g 1   Vitamin D , Ergocalciferol , (DRISDOL ) 1.25 MG (50000 UNIT) CAPS capsule Take 1 capsule (50,000 Units total) by mouth every 7 (seven) days. 12 capsule 1   No current facility-administered medications on file prior to visit.    Review of Systems  Constitutional:  Positive for fatigue. Negative for activity change, appetite change, fever and unexpected weight change.  HENT:  Negative for congestion, ear pain, rhinorrhea, sinus pressure and sore throat.   Eyes:  Negative for pain, redness and visual disturbance.  Respiratory:  Negative for cough, shortness of breath and wheezing.   Cardiovascular:  Negative for chest pain and palpitations.  Gastrointestinal:  Negative for abdominal pain, blood in stool, constipation and diarrhea.  Endocrine: Negative for polydipsia and polyuria.  Genitourinary:  Negative for dysuria, frequency and urgency.  Musculoskeletal:  Negative for arthralgias, back pain and myalgias.  Skin:  Negative for pallor and rash.  Allergic/Immunologic: Negative for environmental allergies.  Neurological:  Negative for dizziness, syncope and headaches.  Hematological:  Negative for adenopathy. Does not bruise/bleed easily.  Psychiatric/Behavioral:  Negative for decreased concentration and dysphoric mood. The patient is not nervous/anxious.        Objective:   Physical Exam Constitutional:      General: She is not in acute distress.    Appearance: Normal appearance. She is well-developed. She is obese. She is not ill-appearing or diaphoretic.  HENT:     Head: Normocephalic and atraumatic.  Eyes:  Conjunctiva/sclera: Conjunctivae normal.     Pupils: Pupils are equal, round, and reactive to light.  Neck:     Thyroid : No thyromegaly.     Vascular: No carotid bruit or JVD.  Cardiovascular:     Rate and Rhythm: Normal rate and regular rhythm.     Heart sounds: Normal heart sounds.     No gallop.  Pulmonary:     Effort: Pulmonary  effort is normal. No respiratory distress.     Breath sounds: Normal breath sounds. No wheezing or rales.  Abdominal:     General: There is no distension or abdominal bruit.     Palpations: Abdomen is soft.  Musculoskeletal:     Cervical back: Normal range of motion and neck supple.     Right lower leg: No edema.     Left lower leg: No edema.  Lymphadenopathy:     Cervical: No cervical adenopathy.  Skin:    General: Skin is warm and dry.     Coloration: Skin is not pale.     Findings: No rash.  Neurological:     Mental Status: She is alert.     Sensory: No sensory deficit.     Coordination: Coordination normal.     Deep Tendon Reflexes: Reflexes are normal and symmetric. Reflexes normal.  Psychiatric:        Mood and Affect: Mood normal.           Assessment & Plan:   Problem List Items Addressed This Visit       Respiratory   OSA (obstructive sleep apnea)   On cpap Also zepbound  for weight loss-doing well so far  Reviewed pulmonary note       Chronic obstructive pulmonary disease (HCC)   Episodic wheezing Vapes less frequently now  UC yesterday-prescription prednisone  60 mg taper and augmentin  (reviewed note and plan today) Much improved today Reassuring exam Will finish treatment Aware prednisone  will increase glucose levels         Endocrine   Type 2 diabetes mellitus with hyperglycemia (HCC) - Primary   Lab Results  Component Value Date   HGBA1C 7.1 (H) 03/10/2023   HGBA1C 6.0 04/05/2022   HGBA1C 7.4 (H) 10/27/2021    A1c ordered Microalb ordered  Has been on/off steroids-no doubt adding On 2nd dose of zepbound  1.5 mg -will help  Will start metformin 500 mg bid for glucose (may help constipation also) Normal foot exam Follow up 3 mo to review rest of guidelines/advised for diabetics  Good start with diet /exercise       Relevant Medications   metFORMIN (GLUCOPHAGE) 500 MG tablet   Other Relevant Orders   Microalbumin / creatinine urine  ratio   Comprehensive metabolic panel with GFR   Hemoglobin A1c   Hypothyroidism   Lab Results  Component Value Date   TSH 1.95 10/20/2022         Hyperlipidemia associated with type 2 diabetes mellitus (HCC)   Disc goals for lipids and reasons to control them Rev last labs with pt Rev low sat fat diet in detail   Rosuvastatin  10 mg daily       Relevant Medications   metFORMIN (GLUCOPHAGE) 500 MG tablet     Other   Vitamin D  deficiency   B12 deficiency   B12 today  Monthly shots       Relevant Orders   Vitamin B12   Anemia   Lifelong Also low B12  Labs ordered  Relevant Orders   Ferritin   Iron   CBC with Differential/Platelet

## 2023-06-20 NOTE — Assessment & Plan Note (Signed)
 B12 today  Monthly shots

## 2023-06-20 NOTE — Assessment & Plan Note (Signed)
 Lab Results  Component Value Date   TSH 1.95 10/20/2022

## 2023-06-20 NOTE — Assessment & Plan Note (Addendum)
 Lab Results  Component Value Date   HGBA1C 7.1 (H) 03/10/2023   HGBA1C 6.0 04/05/2022   HGBA1C 7.4 (H) 10/27/2021    A1c ordered Microalb ordered  Has been on/off steroids-no doubt adding On 2nd dose of zepbound  1.5 mg -will help  Will start metformin 500 mg bid for glucose (may help constipation also) Normal foot exam Follow up 3 mo to review rest of guidelines/advised for diabetics  Good start with diet /exercise

## 2023-06-20 NOTE — Assessment & Plan Note (Signed)
 Disc goals for lipids and reasons to control them Rev last labs with pt Rev low sat fat diet in detail   Rosuvastatin  10 mg daily

## 2023-06-20 NOTE — Patient Instructions (Addendum)
 To avoid muscle loss with zepbound  Keep eating lots of lean protein  Walk or other cardio Add some strength training to your routine, this is important for bone and brain health and can reduce your risk of falls and help your body use insulin  properly and regulate weight  Light weights, exercise bands , and internet videos are a good way to start  Yoga (chair or regular), machines , floor exercises or a gym with machines are also good options     Continue zebound as prescribed   Add metformin (for blood sugar and weight loss , and also may help the constipation)  Follow up in 3 months   Labs today  Urine protein test for diabetes today   Let us  know if wheezing does not continue to improve

## 2023-06-20 NOTE — Assessment & Plan Note (Signed)
 On cpap Also zepbound  for weight loss-doing well so far  Reviewed pulmonary note

## 2023-06-21 ENCOUNTER — Other Ambulatory Visit: Payer: Self-pay

## 2023-06-21 MED ORDER — FLUTICASONE-SALMETEROL 250-50 MCG/ACT IN AEPB
1.0000 | INHALATION_SPRAY | Freq: Two times a day (BID) | RESPIRATORY_TRACT | 2 refills | Status: AC
  Filled 2023-06-21: qty 60, 30d supply, fill #0
  Filled 2023-07-06 – 2023-07-19 (×2): qty 60, 30d supply, fill #1
  Filled 2023-09-08: qty 60, 30d supply, fill #2
  Filled 2023-09-30 – 2023-10-29 (×2): qty 60, 30d supply, fill #3
  Filled 2023-11-30: qty 60, 30d supply, fill #4
  Filled 2023-12-28: qty 60, 30d supply, fill #5
  Filled 2024-01-25: qty 60, 30d supply, fill #6

## 2023-06-25 ENCOUNTER — Ambulatory Visit: Payer: Self-pay | Admitting: Family Medicine

## 2023-06-27 ENCOUNTER — Other Ambulatory Visit: Payer: Self-pay

## 2023-07-06 ENCOUNTER — Other Ambulatory Visit: Payer: Self-pay

## 2023-07-06 ENCOUNTER — Other Ambulatory Visit: Payer: Self-pay | Admitting: Sleep Medicine

## 2023-07-06 ENCOUNTER — Other Ambulatory Visit: Payer: Self-pay | Admitting: Family Medicine

## 2023-07-06 MED FILL — Cyanocobalamin Inj 1000 MCG/ML: INTRAMUSCULAR | 30 days supply | Qty: 1 | Fill #0 | Status: AC

## 2023-07-06 MED FILL — Pregabalin Cap 25 MG: ORAL | 30 days supply | Qty: 60 | Fill #2 | Status: CN

## 2023-07-06 MED FILL — Escitalopram Oxalate Tab 20 MG (Base Equiv): ORAL | 30 days supply | Qty: 30 | Fill #7 | Status: CN

## 2023-07-07 ENCOUNTER — Other Ambulatory Visit: Payer: Self-pay

## 2023-07-07 ENCOUNTER — Other Ambulatory Visit: Payer: Self-pay | Admitting: Family Medicine

## 2023-07-07 MED ORDER — VITAMIN D (ERGOCALCIFEROL) 1.25 MG (50000 UNIT) PO CAPS
50000.0000 [IU] | ORAL_CAPSULE | ORAL | 0 refills | Status: DC
  Filled 2023-07-07: qty 4, 28d supply, fill #0
  Filled 2023-08-03: qty 4, 28d supply, fill #1
  Filled 2023-09-08: qty 4, 28d supply, fill #2

## 2023-07-07 MED ORDER — "SYRINGE 25G X 1"" 3 ML MISC"
3 refills | Status: AC
  Filled 2023-07-07 – 2023-11-30 (×7): qty 1, fill #0
  Filled 2023-12-28: qty 1, 1d supply, fill #0
  Filled 2024-01-25: qty 1, 1d supply, fill #1
  Filled 2024-02-06: qty 1, 1d supply, fill #2

## 2023-07-07 MED ORDER — SUMATRIPTAN SUCCINATE 100 MG PO TABS
100.0000 mg | ORAL_TABLET | Freq: Once | ORAL | 1 refills | Status: DC | PRN
  Filled 2023-07-07: qty 9, 15d supply, fill #0
  Filled 2023-08-03: qty 9, 15d supply, fill #1
  Filled 2023-08-21: qty 9, 15d supply, fill #2
  Filled 2023-09-30: qty 9, 15d supply, fill #3
  Filled 2023-11-17: qty 9, 15d supply, fill #4
  Filled 2023-12-28: qty 9, 15d supply, fill #5

## 2023-07-07 MED FILL — Fluticasone Propionate Nasal Susp 50 MCG/ACT: NASAL | 30 days supply | Qty: 16 | Fill #0 | Status: AC

## 2023-07-07 MED FILL — Mometasone Furoate Solution 0.1% (Lotion): CUTANEOUS | 30 days supply | Qty: 60 | Fill #0 | Status: AC

## 2023-07-07 MED FILL — Rosuvastatin Calcium Tab 10 MG: ORAL | 30 days supply | Qty: 30 | Fill #0 | Status: AC

## 2023-07-07 NOTE — Telephone Encounter (Signed)
 Vit D last filled on 11/15/22 #12 caps/ 1 refill Imitrex  last filled on 07/14/22 #27 tab/ 2 refill Needles for B12 inj last filled on 06/10/22 #1 each/ 5 refill  F/u scheduled 09/21/23

## 2023-07-07 NOTE — Telephone Encounter (Signed)
 Both meds have not been filled since 09/09/21  F/u scheduled 09/21/23

## 2023-07-08 ENCOUNTER — Other Ambulatory Visit: Payer: Self-pay | Admitting: Sleep Medicine

## 2023-07-08 ENCOUNTER — Other Ambulatory Visit: Payer: Self-pay

## 2023-07-08 NOTE — Telephone Encounter (Unsigned)
 Copied from CRM [id number]. Topic: Clinical - Medication Refill >> Jul 08, 2023  4:58 PM Celestine F wrote: Medication: tirzepatide  (ZEPBOUND ) 2.5 MG/0.5ML Pen   Has the patient contacted their pharmacy? Yes; [t was due for a refill, but the medication was not sent to the pharmacy.  (Agent: If no, request that the patient contact the pharmacy for the refill. If patient does not wish to contact the pharmacy document the reason why and proceed with request.) (Agent: If yes, when and what did the pharmacy advise?)  This is the patient's preferred pharmacy:  [facility] [address] [place] [address] Phone: [phone] Fax: [phone]  Is this the correct pharmacy for this prescription? Yes If no, delete pharmacy and type the correct one.   Has the prescription been filled recently? Yes  Is the patient out of the medication? Yes  Has the patient been seen for an appointment in the last year OR does the patient have an upcoming appointment? Yes  Can we respond through MyChart? Yes  Agent: Please be advised that Rx refills may take up to 3 business days. We ask that you follow-up with your pharmacy.

## 2023-07-11 ENCOUNTER — Other Ambulatory Visit: Payer: Self-pay

## 2023-07-11 ENCOUNTER — Other Ambulatory Visit: Payer: Self-pay | Admitting: *Deleted

## 2023-07-11 MED ORDER — MAGNESIUM CITRATE 125 MG PO CAPS
1.0000 | ORAL_CAPSULE | Freq: Two times a day (BID) | ORAL | 0 refills | Status: AC
  Filled 2023-07-11 – 2023-11-30 (×7): qty 180, fill #0
  Filled 2023-12-28: qty 180, 30d supply, fill #0
  Filled 2024-01-25: qty 60, 30d supply, fill #1

## 2023-07-12 ENCOUNTER — Other Ambulatory Visit: Payer: Self-pay | Admitting: Sleep Medicine

## 2023-07-12 ENCOUNTER — Other Ambulatory Visit: Payer: Self-pay

## 2023-07-12 DIAGNOSIS — G4733 Obstructive sleep apnea (adult) (pediatric): Secondary | ICD-10-CM

## 2023-07-12 MED ORDER — ZEPBOUND 5 MG/0.5ML ~~LOC~~ SOAJ
5.0000 mg | SUBCUTANEOUS | 1 refills | Status: DC
  Filled 2023-07-12: qty 2, 28d supply, fill #0
  Filled 2023-08-03: qty 2, 28d supply, fill #1

## 2023-07-13 ENCOUNTER — Ambulatory Visit: Admitting: Urology

## 2023-07-13 ENCOUNTER — Encounter: Payer: Self-pay | Admitting: Urology

## 2023-07-13 ENCOUNTER — Other Ambulatory Visit: Payer: Self-pay

## 2023-07-13 VITALS — BP 111/75 | HR 91 | Ht 65.0 in | Wt 236.0 lb

## 2023-07-13 DIAGNOSIS — N3946 Mixed incontinence: Secondary | ICD-10-CM | POA: Diagnosis not present

## 2023-07-13 DIAGNOSIS — R350 Frequency of micturition: Secondary | ICD-10-CM | POA: Diagnosis not present

## 2023-07-13 DIAGNOSIS — R3915 Urgency of urination: Secondary | ICD-10-CM

## 2023-07-13 DIAGNOSIS — R310 Gross hematuria: Secondary | ICD-10-CM

## 2023-07-13 LAB — URINALYSIS, COMPLETE
Bilirubin, UA: NEGATIVE
Glucose, UA: NEGATIVE
Leukocytes,UA: NEGATIVE
Nitrite, UA: NEGATIVE
Specific Gravity, UA: 1.03 (ref 1.005–1.030)
Urobilinogen, Ur: 1 mg/dL (ref 0.2–1.0)
pH, UA: 6 (ref 5.0–7.5)

## 2023-07-13 LAB — MICROSCOPIC EXAMINATION: Epithelial Cells (non renal): >10 /HPF — AB (ref 0–10)

## 2023-07-13 NOTE — Progress Notes (Signed)
 I, Evelyn Shepherd, acting as a scribe for Evelyn JAYSON Barba, MD., have documented all relevant documentation  on the behalf of Evelyn JAYSON Barba, MD, as directed by Evelyn JAYSON Barba, MD while in the presence of Evelyn JAYSON Barba, MD.  07/13/2023 3:05 PM   Evelyn Shepherd 11-27-1960 [id number]  Referring provider: Randeen Laine LABOR, MD [address] [place],  [place] [address]  Chief Complaint  Patient presents with   Hematuria    HPI: Evelyn Shepherd is a 63 y.o. female referred for the evaluation of microhematuria.  Saw Dr Randeen early March 2025 and urinalysis with 4-5 RBC/3-4 WBC. Urine culture was ordered, which was negative; follow-up urinalysis 04/01/2023 showed 3-6 RBCs on microscopy.  Does note intermittent gross hematuria over the last several months. Last week she states her urine was dark red in color and associated with right flank pain, vomiting, and lower urinary tract symptoms which spontaneously resolved.  She has had a history of stone disease and states these symptoms were similar to a prior stone that she passed 7 years ago.  She does have chronic urinary frequency, urgency with urge incontinence and stress urinary incontinence.   PMH: Past Medical History:  Diagnosis Date   History of depression    History of gastroesophageal reflux (GERD)    History of hyperlipidemia    History of migraine    History of obesity    lap band surgery   History of pneumonia 2005    Surgical History: Past Surgical History:  Procedure Laterality Date   AUGMENTATION MAMMAPLASTY Bilateral 1991   BREAST ENHANCEMENT SURGERY     CESAREAN SECTION     DENTAL SURGERY     ESOPHAGOGASTRODUODENOSCOPY N/A 04/27/2023   Procedure: EGD (ESOPHAGOGASTRODUODENOSCOPY);  Surgeon: Therisa Bi, MD;  Location: [facility] ENDOSCOPY;  Service: Gastroenterology;  Laterality: N/A;   LAPAROSCOPIC GASTRIC BANDING  07/31/07   LUMBAR DISC SURGERY     TONGUE SURGERY  03/2005   lesion removal    Home  Medications:  Allergies as of 07/13/2023       Reactions   Codeine Itching   Meloxicam     REACTION: unspecified   Sulfonamide Derivatives    REACTION: unspecified        Medication List        Accurate as of July 13, 2023  3:05 PM. If you have any questions, ask your nurse or doctor.          albuterol  108 (90 Base) MCG/ACT inhaler Commonly known as: VENTOLIN  HFA Inhale 2 puffs into the lungs every 6 (six) hours as needed (cough).   amoxicillin -clavulanate 875-125 MG tablet Commonly known as: AUGMENTIN  Take 1 tablet by mouth every 12 (twelve) hours.   ciprofloxacin -dexamethasone  OTIC suspension Commonly known as: Ciprodex  Place 4 drops into the left ear 2 (two) times daily.   cyanocobalamin  1000 MCG/ML injection Commonly known as: VITAMIN B12 Inject 1 mL (1,000 mcg total) into the muscle every 30 (thirty) days.   escitalopram  20 MG tablet Commonly known as: LEXAPRO  Take 1 tablet (20 mg total) by mouth daily.   fluticasone  50 MCG/ACT nasal spray Commonly known as: FLONASE  Spray two sprays in each nostril once daily   fluticasone -salmeterol 250-50 MCG/ACT Aepb Commonly known as: ADVAIR  Inhale 1 puff into the lungs every 12 (twelve) hours.   ibuprofen  800 MG tablet Commonly known as: ADVIL  Take 1 tablet (800 mg total) by mouth every 8 (eight) hours as needed (with food).   ketoconazole   2 % cream Commonly known as: NIZORAL  Apply to the affected area(s) daily. (Apply topically daily.)   Magnesium  Citrate 125 MG Caps Take 1 capsule by mouth in the morning and at bedtime.   meclizine  25 MG tablet Commonly known as: ANTIVERT  Take 1 tablet (25 mg total) by mouth as needed for dizziness.   metFORMIN  500 MG tablet Commonly known as: GLUCOPHAGE  Take 1 tablet (500 mg total) by mouth 2 (two) times daily with a meal.   mometasone  0.1 % lotion Commonly known as: ELOCON  Apply to itchy ears twice daily for 12 days.  May repeat as needed for itchy ears    omeprazole  40 MG capsule Commonly known as: PRILOSEC Take 1 capsule (40 mg total) by mouth in the morning and at bedtime.   predniSONE  10 MG (21) Tbpk tablet Commonly known as: STERAPRED UNI-PAK 21 TAB Take by mouth daily. Take 6 tabs by mouth daily  for 1 days, then 5 tabs for 1 days, then 4 tabs for 1 days, then 3 tabs for 1 days, 2 tabs for 1 days, then 1 tab by mouth daily for 1 days   pregabalin  25 MG capsule Commonly known as: LYRICA  Take 1 capsule (25 mg total) by mouth 2 (two) times daily.   rosuvastatin  10 MG tablet Commonly known as: CRESTOR  Take 1 tablet (10 mg total) by mouth daily.   SUMAtriptan  100 MG tablet Commonly known as: IMITREX  Take 1 tablet (100 mg total) by mouth once as needed for up to 1 dose for headache, can repeat dose in 2 hours if needed, maximum dose of 2 tablets in one day   SYRINGE 3CC/21GX1 21G X 1 3 ML Misc Use with B12 injections   triamcinolone  cream 0.1 % Commonly known as: KENALOG  Apply 1 Application topically 2 (two) times daily as needed (to affected areas).   Vitamin D  (Ergocalciferol ) 1.25 MG (50000 UNIT) Caps capsule Commonly known as: DRISDOL  Take 1 capsule (50,000 Units total) by mouth every 7 (seven) days.   Zepbound  5 MG/0.5ML Pen Generic drug: tirzepatide  Inject 5 mg into the skin once a week.        Allergies:  Allergies  Allergen Reactions   Codeine Itching   Meloxicam      REACTION: unspecified   Sulfonamide Derivatives     REACTION: unspecified    Family History: Family History  Problem Relation Age of Onset   Kidney cancer Mother    Diabetes Mother    Heart attack Mother    Pneumonia Father        died   Lung cancer Father    Depression Brother        commited suicide   Alcohol abuse Brother    Breast cancer Paternal Aunt    Breast cancer Paternal Aunt    Early menopause Other        family   Colon cancer Neg Hx    Pancreatic cancer Neg Hx    Rectal cancer Neg Hx    Stomach cancer Neg Hx      Social History:  reports that she quit smoking about 5 years ago. Her smoking use included cigarettes. She started smoking about 17 years ago. She has a 18 pack-year smoking history. She has never used smokeless tobacco. She reports current alcohol use. She reports that she does not use drugs.   Physical Exam: BP 111/75   Pulse 91   Ht 5' 5 (1.651 m)   Wt 236 lb (107 kg)   BMI  39.27 kg/m   Constitutional:  Alert and oriented, No acute distress. HEENT: [place] AT Respiratory: Normal respiratory effort, no increased work of breathing. GI: Abdomen is soft, nontender, nondistended, no abdominal masses Psychiatric: Normal mood and affect.   Urinalysis Dipstick 1+ protein/1+ blood/trace ketones, microscopy 3-10 RBC/>10 epis    Assessment & Plan:    1. Recurrent gross hematuria AUA risk stratification: High We discussed the recommended evaluation of high risk hematuria which consist of CT urogram and cystoscopy.  The procedures were discussed in detail and she has elected to proceed with further evaluation All questions were answered CTU order placed and cystoscopy was scheduled  2. Urge incontinence Probable overactive bladder. Will discuss management if CT/cystoscopy negative for pertinent findings.   3. Stress urinary incontinence Will discuss management after a hematuria evaluation completed.  I have reviewed the above documentation for accuracy and completeness, and I agree with the above.   Evelyn JAYSON Barba, MD  [facility] [address] [place], [place] [address] [phone]

## 2023-07-13 NOTE — Patient Instructions (Signed)
 Schedule [phone]

## 2023-07-19 MED FILL — Escitalopram Oxalate Tab 20 MG (Base Equiv): ORAL | 30 days supply | Qty: 30 | Fill #7 | Status: AC

## 2023-07-20 ENCOUNTER — Other Ambulatory Visit: Payer: Self-pay

## 2023-07-25 ENCOUNTER — Ambulatory Visit
Admission: RE | Admit: 2023-07-25 | Discharge: 2023-07-25 | Disposition: A | Discharge status: Home / Self Care | Source: Ambulatory Visit | Attending: Urology | Admitting: Urology

## 2023-07-25 DIAGNOSIS — R310 Gross hematuria: Secondary | ICD-10-CM | POA: Insufficient documentation

## 2023-07-25 MED ORDER — IOHEXOL 300 MG/ML  SOLN
100.0000 mL | Freq: Once | INTRAMUSCULAR | Status: AC | PRN
  Administered 2023-07-25: 100 mL via INTRAVENOUS

## 2023-07-26 ENCOUNTER — Other Ambulatory Visit: Payer: Self-pay

## 2023-08-02 ENCOUNTER — Other Ambulatory Visit: Payer: Self-pay | Admitting: Sleep Medicine

## 2023-08-02 NOTE — Telephone Encounter (Signed)
 Copied from CRM [id number]. Topic: Clinical - Medication Refill >> Aug 02, 2023 10:24 AM Rozanna MATSU wrote: Medication: Rx #: [id number]  tirzepatide  (ZEPBOUND ) 5 MG/0.5ML Pen   Has the patient contacted their pharmacy? No (Agent: If no, request that the patient contact the pharmacy for the refill. If patient does not wish to contact the pharmacy document the reason why and proceed with request.) (Agent: If yes, when and what did the pharmacy advise?)  This is the patient's preferred pharmacy:  [facility] [address] [place] [address] Phone: [phone] Fax: [phone]  Is this the correct pharmacy for this prescription? Yes If no, delete pharmacy and type the correct one.   Has the prescription been filled recently? Yes  Is the patient out of the medication? No  Has the patient been seen for an appointment in the last year OR does the patient have an upcoming appointment? Yes  Can we respond through MyChart? Yes  Agent: Please be advised that Rx refills may take up to 3 business days. We ask that you follow-up with your pharmacy.

## 2023-08-03 ENCOUNTER — Other Ambulatory Visit: Payer: Self-pay

## 2023-08-03 MED FILL — Fluticasone Propionate Nasal Susp 50 MCG/ACT: NASAL | 30 days supply | Qty: 16 | Fill #1 | Status: AC

## 2023-08-03 MED FILL — Pregabalin Cap 25 MG: ORAL | 30 days supply | Qty: 60 | Fill #2 | Status: AC

## 2023-08-03 MED FILL — Cyanocobalamin Inj 1000 MCG/ML: INTRAMUSCULAR | 30 days supply | Qty: 1 | Fill #1 | Status: AC

## 2023-08-03 MED FILL — Rosuvastatin Calcium Tab 10 MG: ORAL | 30 days supply | Qty: 30 | Fill #1 | Status: AC

## 2023-08-03 NOTE — Telephone Encounter (Signed)
 I see the refill -she may want to go up on the dose Please ask if she does

## 2023-08-03 NOTE — Telephone Encounter (Signed)
 Last filled on 07/12/23 #2 mL/  1 refill (? If to soon), please also see prev messages from pt F/u scheduled 09/21/23

## 2023-08-04 ENCOUNTER — Other Ambulatory Visit: Payer: Self-pay

## 2023-08-04 MED ORDER — AMOXICILLIN 500 MG PO CAPS
500.0000 mg | ORAL_CAPSULE | Freq: Three times a day (TID) | ORAL | 0 refills | Status: DC
  Filled 2023-08-04: qty 21, 7d supply, fill #0
  Filled 2023-08-04: qty 16, 5d supply, fill #0
  Filled 2023-08-04: qty 5, 2d supply, fill #0

## 2023-08-05 NOTE — Telephone Encounter (Signed)
 Left VM requesting pt to call the office back

## 2023-08-07 ENCOUNTER — Ambulatory Visit: Payer: Self-pay | Admitting: Urology

## 2023-08-07 DIAGNOSIS — R310 Gross hematuria: Secondary | ICD-10-CM

## 2023-08-08 NOTE — Telephone Encounter (Signed)
 Ok, if she does not need it yet- please do not approve

## 2023-08-08 NOTE — Telephone Encounter (Signed)
 Called and spoke with patient, states the pharmacy filled the Zepbound  prescription for 5mg  again for her last week. States she will let us  know when she is about to finish that refill so she can go up to next dose.

## 2023-08-15 ENCOUNTER — Ambulatory Visit
Admission: RE | Admit: 2023-08-15 | Discharge: 2023-08-15 | Disposition: A | Discharge status: Home / Self Care | Source: Ambulatory Visit | Attending: Urology | Admitting: Urology

## 2023-08-15 ENCOUNTER — Ambulatory Visit
Admission: RE | Admit: 2023-08-15 | Discharge: 2023-08-15 | Disposition: A | Discharge status: Home / Self Care | Attending: Urology | Admitting: Urology

## 2023-08-15 DIAGNOSIS — R310 Gross hematuria: Secondary | ICD-10-CM | POA: Insufficient documentation

## 2023-08-16 ENCOUNTER — Other Ambulatory Visit: Payer: Self-pay

## 2023-08-16 ENCOUNTER — Ambulatory Visit (INDEPENDENT_AMBULATORY_CARE_PROVIDER_SITE_OTHER): Admitting: Urology

## 2023-08-16 VITALS — BP 110/73 | HR 83 | Ht 65.0 in | Wt 229.0 lb

## 2023-08-16 DIAGNOSIS — N201 Calculus of ureter: Secondary | ICD-10-CM | POA: Diagnosis not present

## 2023-08-16 DIAGNOSIS — N390 Urinary tract infection, site not specified: Secondary | ICD-10-CM

## 2023-08-16 DIAGNOSIS — R310 Gross hematuria: Secondary | ICD-10-CM

## 2023-08-16 LAB — URINALYSIS, COMPLETE
Bilirubin, UA: NEGATIVE
Glucose, UA: NEGATIVE
Ketones, UA: NEGATIVE
Nitrite, UA: POSITIVE — AB
Protein,UA: NEGATIVE
Specific Gravity, UA: 1.02 (ref 1.005–1.030)
Urobilinogen, Ur: 1 mg/dL (ref 0.2–1.0)
pH, UA: 6.5 (ref 5.0–7.5)

## 2023-08-16 LAB — MICROSCOPIC EXAMINATION
RBC, Urine: >30 /HPF — AB (ref 0–2)
WBC, UA: >30 /HPF — AB (ref 0–5)

## 2023-08-16 MED ORDER — TAMSULOSIN HCL 0.4 MG PO CAPS
0.4000 mg | ORAL_CAPSULE | Freq: Every day | ORAL | 0 refills | Status: DC
  Filled 2023-08-16: qty 14, 14d supply, fill #0

## 2023-08-16 MED ORDER — CEFUROXIME AXETIL 250 MG PO TABS
250.0000 mg | ORAL_TABLET | Freq: Two times a day (BID) | ORAL | 0 refills | Status: AC
  Filled 2023-08-16: qty 14, 7d supply, fill #0

## 2023-08-16 NOTE — Progress Notes (Unsigned)
 08/16/2023 1:24 PM   Evelyn Shepherd Mar 14, 1960 [id number]  Referring provider: Randeen Laine LABOR, MD [address] [place],  [place] [address]  Chief Complaint  Patient presents with   Follow-up    HPI: Evelyn Shepherd is a 63 y.o. female presents for follow-up.  Refer to my previous note 07/13/2023.  CT urogram was remarkable for a 5 mm UVJ calculus which was felt to be the cause of her hematuria and urinary symptoms She was having lower pelvic discomfort up until yesterday but still having frequency, urgency with voiding small amounts No fever, chills, dysuria or recurrent gross hematuria   PMH: Past Medical History:  Diagnosis Date   History of depression    History of gastroesophageal reflux (GERD)    History of hyperlipidemia    History of migraine    History of obesity    lap band surgery   History of pneumonia 2005    Surgical History: Past Surgical History:  Procedure Laterality Date   AUGMENTATION MAMMAPLASTY Bilateral 1991   BREAST ENHANCEMENT SURGERY     CESAREAN SECTION     DENTAL SURGERY     ESOPHAGOGASTRODUODENOSCOPY N/A 04/27/2023   Procedure: EGD (ESOPHAGOGASTRODUODENOSCOPY);  Surgeon: Therisa Bi, MD;  Location: [facility] ENDOSCOPY;  Service: Gastroenterology;  Laterality: N/A;   LAPAROSCOPIC GASTRIC BANDING  07/31/07   LUMBAR DISC SURGERY     TONGUE SURGERY  03/2005   lesion removal    Home Medications:  Allergies as of 08/16/2023       Reactions   Codeine Itching   Meloxicam     REACTION: unspecified   Sulfonamide Derivatives    REACTION: unspecified        Medication List        Accurate as of August 16, 2023  1:24 PM. If you have any questions, ask your nurse or doctor.          albuterol  108 (90 Base) MCG/ACT inhaler Commonly known as: VENTOLIN  HFA Inhale 2 puffs into the lungs every 6 (six) hours as needed (cough).   amoxicillin  500 MG capsule Commonly known as: AMOXIL  Take 1 capsule (500 mg total) by mouth 3 (three) times  daily.   cefUROXime  250 MG tablet Commonly known as: CEFTIN  Take 1 tablet (250 mg total) by mouth 2 (two) times daily with a meal for 7 days. Started by: [name]   ciprofloxacin -dexamethasone  OTIC suspension Commonly known as: Ciprodex  Place 4 drops into the left ear 2 (two) times daily.   cyanocobalamin  1000 MCG/ML injection Commonly known as: VITAMIN B12 Inject 1 mL (1,000 mcg total) into the muscle every 30 (thirty) days.   escitalopram  20 MG tablet Commonly known as: LEXAPRO  Take 1 tablet (20 mg total) by mouth daily.   fluticasone  50 MCG/ACT nasal spray Commonly known as: FLONASE  Spray two sprays in each nostril once daily   fluticasone -salmeterol 250-50 MCG/ACT Aepb Commonly known as: ADVAIR  Inhale 1 puff into the lungs every 12 (twelve) hours.   ibuprofen  800 MG tablet Commonly known as: ADVIL  Take 1 tablet (800 mg total) by mouth every 8 (eight) hours as needed (with food).   ketoconazole  2 % cream Commonly known as: NIZORAL  Apply to the affected area(s) daily. (Apply topically daily.)   Magnesium  Citrate 125 MG Caps Take 1 capsule by mouth in the morning and at bedtime.   meclizine  25 MG tablet Commonly known as: ANTIVERT  Take 1 tablet (25 mg total) by mouth as needed for dizziness.   metFORMIN  500  MG tablet Commonly known as: GLUCOPHAGE  Take 1 tablet (500 mg total) by mouth 2 (two) times daily with a meal.   mometasone  0.1 % lotion Commonly known as: ELOCON  Apply to itchy ears twice daily for 12 days.  May repeat as needed for itchy ears   omeprazole  40 MG capsule Commonly known as: PRILOSEC Take 1 capsule (40 mg total) by mouth in the morning and at bedtime.   pregabalin  25 MG capsule Commonly known as: LYRICA  Take 1 capsule (25 mg total) by mouth 2 (two) times daily.   rosuvastatin  10 MG tablet Commonly known as: CRESTOR  Take 1 tablet (10 mg total) by mouth daily.   SUMAtriptan  100 MG tablet Commonly known as: IMITREX  Take 1 tablet  (100 mg total) by mouth once as needed for up to 1 dose for headache, can repeat dose in 2 hours if needed, maximum dose of 2 tablets in one day   SYRINGE 3CC/21GX1 21G X 1 3 ML Misc Use with B12 injections   tamsulosin  0.4 MG Caps capsule Commonly known as: FLOMAX  Take 1 capsule (0.4 mg total) by mouth daily. Started by: Tericka Devincenzi C Naeema Patlan   triamcinolone  cream 0.1 % Commonly known as: KENALOG  Apply 1 Application topically 2 (two) times daily as needed (to affected areas).   Vitamin D  (Ergocalciferol ) 1.25 MG (50000 UNIT) Caps capsule Commonly known as: DRISDOL  Take 1 capsule (50,000 Units total) by mouth every 7 (seven) days.   Zepbound  5 MG/0.5ML Pen Generic drug: tirzepatide  Inject 5 mg into the skin once a week.        Allergies:  Allergies  Allergen Reactions   Codeine Itching   Meloxicam      REACTION: unspecified   Sulfonamide Derivatives     REACTION: unspecified    Family History: Family History  Problem Relation Age of Onset   Kidney cancer Mother    Diabetes Mother    Heart attack Mother    Pneumonia Father        died   Lung cancer Father    Depression Brother        commited suicide   Alcohol abuse Brother    Breast cancer Paternal Aunt    Breast cancer Paternal Aunt    Early menopause Other        family   Colon cancer Neg Hx    Pancreatic cancer Neg Hx    Rectal cancer Neg Hx    Stomach cancer Neg Hx     Social History:  reports that she quit smoking about 5 years ago. Her smoking use included cigarettes. She started smoking about 17 years ago. She has a 18 pack-year smoking history. She has never used smokeless tobacco. She reports current alcohol use. She reports that she does not use drugs.   Physical Exam: BP 110/73   Pulse 83   Ht 5' 5 (1.651 m)   Wt 229 lb (103.9 kg)   BMI 38.11 kg/m   Constitutional:  Alert and oriented, No acute distress. HEENT: [place] AT Respiratory: Normal respiratory effort, no increased work of  breathing. Psychiatric: Normal mood and affect.  Laboratory Data:  Urinalysis Dipstick trace blood/nitrite positive/2+ leukocytes Microscopy >30 WBC/>30 RBC   Pertinent Imaging: KUB performed prior to this appointment was personally reviewed and interpreted.  A 5 mm right UVJ stone is not identified   Assessment & Plan:    1. Gross hematuria  Most likely secondary to right UVJ calculus  2.  Right distal ureteral calculus Not visualized  on KUB We discussed options of medical expulsion therapy versus ureteroscopy and she has elected the former Rx tamsulosin  was sent to pharmacy Given strainer Follow-up ~2 weeks ***  3.  Urinary tract infection She does have storage related voiding symptoms Urine culture was ordered Rx cefuroxime  was sent to pharmacy pending culture report    [name], MD  [facility] [address], [address] [place], [place] [address] [phone]

## 2023-08-20 LAB — CULTURE, URINE COMPREHENSIVE

## 2023-08-21 ENCOUNTER — Ambulatory Visit: Payer: Self-pay | Admitting: Urology

## 2023-08-21 ENCOUNTER — Other Ambulatory Visit: Payer: Self-pay

## 2023-09-01 ENCOUNTER — Other Ambulatory Visit: Payer: Self-pay

## 2023-09-01 ENCOUNTER — Encounter: Payer: Self-pay | Admitting: Sleep Medicine

## 2023-09-01 ENCOUNTER — Ambulatory Visit (INDEPENDENT_AMBULATORY_CARE_PROVIDER_SITE_OTHER): Admitting: Sleep Medicine

## 2023-09-01 VITALS — BP 118/60 | HR 76 | Temp 97.7°F | Ht 65.0 in | Wt 228.8 lb

## 2023-09-01 DIAGNOSIS — G4733 Obstructive sleep apnea (adult) (pediatric): Secondary | ICD-10-CM

## 2023-09-01 DIAGNOSIS — Z6838 Body mass index (BMI) 38.0-38.9, adult: Secondary | ICD-10-CM | POA: Diagnosis not present

## 2023-09-01 DIAGNOSIS — F5104 Psychophysiologic insomnia: Secondary | ICD-10-CM

## 2023-09-01 DIAGNOSIS — G4700 Insomnia, unspecified: Secondary | ICD-10-CM | POA: Diagnosis not present

## 2023-09-01 DIAGNOSIS — E669 Obesity, unspecified: Secondary | ICD-10-CM | POA: Diagnosis not present

## 2023-09-01 MED ORDER — ZEPBOUND 7.5 MG/0.5ML ~~LOC~~ SOAJ
7.5000 mg | SUBCUTANEOUS | 2 refills | Status: DC
  Filled 2023-09-01: qty 2, 28d supply, fill #0
  Filled 2023-09-30: qty 2, 28d supply, fill #1
  Filled 2023-10-29: qty 2, 28d supply, fill #2

## 2023-09-01 MED ORDER — TRAZODONE HCL 50 MG PO TABS
50.0000 mg | ORAL_TABLET | Freq: Every day | ORAL | 3 refills | Status: DC
  Filled 2023-09-01: qty 30, 30d supply, fill #0
  Filled 2023-09-30: qty 30, 30d supply, fill #1
  Filled 2023-10-29: qty 30, 30d supply, fill #2
  Filled 2023-11-30: qty 30, 30d supply, fill #3

## 2023-09-01 NOTE — Patient Instructions (Addendum)

## 2023-09-01 NOTE — Progress Notes (Signed)
 Name:Evelyn Shepherd MRN: [id number] DOB: 1960-03-19   CHIEF COMPLAINT:  CPAP F/U   HISTORY OF PRESENT ILLNESS:  Evelyn Shepherd is a 63 y.o. w/ a h/o OSA, anxiety and obesity who presents for CPAP f/u visit. Reports using CPAP therapy every night, which is confirmed by compliance data. She is currently using the Airfit F40 FFM, which is comfortable. Reports occasional air leaks.  c/o loud snoring and excessive daytime sleepiness which has been present for several years. Reports nocturnal awakenings due to unclear reasons, however does not have difficulty falling back to sleep. Denies any significant weight changes. Denies morning headaches, RLS symptoms, dream enactment, cataplexy, hypnagogic or hypnapompic hallucinations. Reports a family history of sleep apnea. Denies drowsy driving. Drinks 1-2 sodas daily, occasional alcohol use, former smoker, denies illicit drug use.   Bedtime 11 pm Sleep onset 10 mins Rise time 6:30-7:30 am   EPWORTH SLEEP SCORE    03/08/2023    8:00 AM  Results of the Epworth flowsheet  Sitting and reading 3  Watching TV 2  Sitting, inactive in a public place (e.g. a theatre or a meeting) 2  As a passenger in a car for an hour without a break 3  Lying down to rest in the afternoon when circumstances permit 3  Sitting and talking to someone 1  Sitting quietly after a lunch without alcohol 2  In a car, while stopped for a few minutes in traffic 2  Total score 18    PAST MEDICAL HISTORY :   has a past medical history of History of depression, History of gastroesophageal reflux (GERD), History of hyperlipidemia, History of migraine, History of obesity, and History of pneumonia (2005).  has a past surgical history that includes Cesarean section; Breast enhancement surgery; Lumbar disc surgery; Dental surgery; Tongue surgery (03/2005); Laparoscopic gastric banding (07/31/07); Augmentation mammaplasty (Bilateral, 1991); and Esophagogastroduodenoscopy (N/A,  04/27/2023). Prior to Admission medications   Medication Sig Start Date End Date Taking? Authorizing Provider  albuterol  (VENTOLIN  HFA) 108 (90 Base) MCG/ACT inhaler Inhale 2 puffs into the lungs every 6 (six) hours as needed (cough). 06/19/23  Yes White, Adrienne R, NP  ciprofloxacin -dexamethasone  (CIPRODEX ) OTIC suspension Place 4 drops into the left ear 2 (two) times daily. 06/03/23  Yes White, Adrienne R, NP  cyanocobalamin  (VITAMIN B12) 1000 MCG/ML injection Inject 1 mL (1,000 mcg total) into the muscle every 30 (thirty) days. 06/20/23  Yes Tower, Laine LABOR, MD  escitalopram  (LEXAPRO ) 20 MG tablet Take 1 tablet (20 mg total) by mouth daily. 08/13/22  Yes Tower, Laine LABOR, MD  fluticasone  (FLONASE ) 50 MCG/ACT nasal spray Spray two sprays in each nostril once daily 07/07/23  Yes Tower, Marne A, MD  fluticasone -salmeterol (ADVAIR ) 250-50 MCG/ACT AEPB Inhale 1 puff into the lungs every 12 (twelve) hours. 06/21/23  Yes Aleskerov, Fuad, MD  ibuprofen  (ADVIL ) 800 MG tablet Take 1 tablet (800 mg total) by mouth every 8 (eight) hours as needed (with food). 04/11/23  Yes Tower, Laine LABOR, MD  ketoconazole  (NIZORAL ) 2 % cream Apply topically daily. 07/15/21  Yes Tower, Laine LABOR, MD  Magnesium  Citrate 125 MG CAPS Take 1 capsule by mouth in the morning and at bedtime. 07/11/23  Yes Tower, Laine LABOR, MD  meclizine  (ANTIVERT ) 25 MG tablet Take 1 tablet (25 mg total) by mouth as needed for dizziness. 07/15/21  Yes Tower, Laine LABOR, MD  metFORMIN  (GLUCOPHAGE ) 500 MG tablet Take 1 tablet (500 mg total) by mouth 2 (two) times  daily with a meal. 06/20/23  Yes Tower, Laine LABOR, MD  mometasone  (ELOCON ) 0.1 % lotion Apply to itchy ears twice daily for 12 days.  May repeat as needed for itchy ears 07/07/23  Yes Tower, Laine LABOR, MD  pregabalin  (LYRICA ) 25 MG capsule Take 1 capsule (25 mg total) by mouth 2 (two) times daily. 05/16/23  Yes Tower, Laine LABOR, MD  rosuvastatin  (CRESTOR ) 10 MG tablet Take 1 tablet (10 mg total) by mouth daily. 07/07/23  Yes  Tower, Laine LABOR, MD  SUMAtriptan  (IMITREX ) 100 MG tablet Take 1 tablet (100 mg total) by mouth once as needed for up to 1 dose for headache, can repeat dose in 2 hours if needed, maximum dose of 2 tablets in one day 07/07/23  Yes Tower, Laine LABOR, MD  Syringe/Needle, Disp, (SYRINGE 3CC/21GX1) 21G X 1 3 ML MISC Use with B12 injections 07/07/23  Yes Tower, Laine LABOR, MD  tamsulosin  (FLOMAX ) 0.4 MG CAPS capsule Take 1 capsule (0.4 mg total) by mouth daily. 08/16/23  Yes Stoioff, Glendia BROCKS, MD  tirzepatide  (ZEPBOUND ) 5 MG/0.5ML Pen Inject 5 mg into the skin once a week. 07/12/23  Yes Cam Dauphin D, MD  triamcinolone  cream (KENALOG ) 0.1 % Apply 1 Application topically 2 (two) times daily as needed (to affected areas). 12/28/22  Yes Tower, Laine LABOR, MD  Vitamin D , Ergocalciferol , (DRISDOL ) 1.25 MG (50000 UNIT) CAPS capsule Take 1 capsule (50,000 Units total) by mouth every 7 (seven) days. 07/07/23  Yes Tower, Laine LABOR, MD  omeprazole  (PRILOSEC) 40 MG capsule Take 1 capsule (40 mg total) by mouth in the morning and at bedtime. Patient not taking: Reported on 09/01/2023 04/27/23   Therisa Bi, MD   Allergies  Allergen Reactions   Codeine Itching   Meloxicam      REACTION: unspecified   Sulfonamide Derivatives     REACTION: unspecified    FAMILY HISTORY:  family history includes Alcohol abuse in her brother; Breast cancer in her paternal aunt and paternal aunt; Depression in her brother; Diabetes in her mother; Early menopause in an other family member; Heart attack in her mother; Kidney cancer in her mother; Lung cancer in her father; Pneumonia in her father. SOCIAL HISTORY:  reports that she quit smoking about 5 years ago. Her smoking use included cigarettes. She started smoking about 17 years ago. She has a 18 pack-year smoking history. She has never used smokeless tobacco. She reports current alcohol use. She reports that she does not use drugs.   Review of Systems:  Gen:  Denies  fever, sweats, chills  weight loss  HEENT: Denies blurred vision, double vision, ear pain, eye pain, hearing loss, nose bleeds, sore throat Cardiac:  No dizziness, chest pain or heaviness, chest tightness,edema, No JVD Resp:   No cough, -sputum production, -shortness of breath,-wheezing, -hemoptysis,  Gi: Denies swallowing difficulty, stomach pain, nausea or vomiting, diarrhea, constipation, bowel incontinence Gu:  Denies bladder incontinence, burning urine Ext:   Denies Joint pain, stiffness or swelling Skin: Denies  skin rash, easy bruising or bleeding or hives Endoc:  Denies polyuria, polydipsia , polyphagia or weight change Psych:   Denies depression, insomnia or hallucinations  Other:  All other systems negative  VITAL SIGNS: BP 118/60   Pulse 76   Temp 97.7 F (36.5 C)   Ht 5' 5 (1.651 m)   Wt 228 lb 12.8 oz (103.8 kg)   SpO2 95%   BMI 38.07 kg/m    Physical Examination:   General Appearance: No  distress  EYES PERRLA, EOM intact.   NECK Supple, No JVD Pulmonary: normal breath sounds, No wheezing.  CardiovascularNormal S1,S2.  No m/r/g.   Abdomen: Benign, Soft, non-tender. Skin:   warm, no rashes, no ecchymosis  Extremities: normal, no cyanosis, clubbing. Neuro:without focal findings,  speech normal  PSYCHIATRIC: Mood, affect within normal limits.   ASSESSMENT AND PLAN  OSA Patient is using and benefiting from CPAP therapy. Discussed the consequences of untreated sleep apnea. Advised not to drive drowsy for safety of patient and others. Will follow up in 3 months.     Morbid obesity  15 lb weight loss since last visit. Increasing Zepbound  dose to 7.5 mg.    Insomnia Will try patient on Trazodone  50 mg nightly. Also counseled patient on stimulus control and improving sleep hygiene practices.    Patient  satisfied with Plan of action and management. All questions answered  I spent a total of 44 minutes reviewing chart data, face-to-face evaluation with the patient, counseling and  coordination of care as detailed above.    Mazi Schuff, M.D.  Sleep Medicine [facility]

## 2023-09-08 ENCOUNTER — Other Ambulatory Visit: Payer: Self-pay | Admitting: Family Medicine

## 2023-09-08 MED FILL — Cyanocobalamin Inj 1000 MCG/ML: INTRAMUSCULAR | 30 days supply | Qty: 1 | Fill #2 | Status: AC

## 2023-09-08 MED FILL — Fluticasone Propionate Nasal Susp 50 MCG/ACT: NASAL | 30 days supply | Qty: 16 | Fill #2 | Status: AC

## 2023-09-08 MED FILL — Pregabalin Cap 25 MG: ORAL | 30 days supply | Qty: 60 | Fill #3 | Status: AC

## 2023-09-08 MED FILL — Rosuvastatin Calcium Tab 10 MG: ORAL | 30 days supply | Qty: 30 | Fill #2 | Status: AC

## 2023-09-09 ENCOUNTER — Other Ambulatory Visit: Payer: Self-pay

## 2023-09-09 MED ORDER — ESCITALOPRAM OXALATE 20 MG PO TABS
20.0000 mg | ORAL_TABLET | Freq: Every day | ORAL | 2 refills | Status: AC
  Filled 2023-09-09: qty 30, 30d supply, fill #0
  Filled 2023-09-30 – 2023-10-29 (×2): qty 30, 30d supply, fill #1
  Filled 2023-11-30: qty 30, 30d supply, fill #2
  Filled 2023-12-28: qty 30, 30d supply, fill #3
  Filled 2024-01-25: qty 30, 30d supply, fill #4

## 2023-09-09 MED ORDER — MECLIZINE HCL 25 MG PO TABS
25.0000 mg | ORAL_TABLET | ORAL | 0 refills | Status: DC | PRN
  Filled 2023-09-09: qty 30, 30d supply, fill #0

## 2023-09-09 NOTE — Telephone Encounter (Signed)
 F/u scheduled 09/21/23  Meclizine  filled on 07/16/23 #30 tab/ 0 refills   Lexapro  filled on 08/13/22 #90 tab/ 2 refills

## 2023-09-17 ENCOUNTER — Telehealth: Payer: Self-pay | Admitting: Urology

## 2023-09-17 NOTE — Telephone Encounter (Signed)
 Recc PA f/u with ua

## 2023-09-19 NOTE — Telephone Encounter (Signed)
Left message to call the office to schedule appt

## 2023-09-19 NOTE — Telephone Encounter (Signed)
 Voice mail was full no message left

## 2023-09-20 NOTE — Telephone Encounter (Signed)
 Scheduled appt.

## 2023-09-21 ENCOUNTER — Ambulatory Visit: Admitting: Family Medicine

## 2023-09-30 ENCOUNTER — Other Ambulatory Visit: Payer: Self-pay | Admitting: Family Medicine

## 2023-09-30 ENCOUNTER — Other Ambulatory Visit: Payer: Self-pay

## 2023-09-30 MED ORDER — VITAMIN D (ERGOCALCIFEROL) 1.25 MG (50000 UNIT) PO CAPS
50000.0000 [IU] | ORAL_CAPSULE | ORAL | 0 refills | Status: DC
  Filled 2023-09-30: qty 4, 28d supply, fill #0
  Filled 2023-10-29: qty 4, 28d supply, fill #1
  Filled 2023-11-30: qty 4, 28d supply, fill #2

## 2023-09-30 MED FILL — Fluticasone Propionate Nasal Susp 50 MCG/ACT: NASAL | 30 days supply | Qty: 16 | Fill #3 | Status: CN

## 2023-09-30 MED FILL — Rosuvastatin Calcium Tab 10 MG: ORAL | 30 days supply | Qty: 30 | Fill #0 | Status: CN

## 2023-09-30 MED FILL — Pregabalin Cap 25 MG: ORAL | 30 days supply | Qty: 60 | Fill #0 | Status: CN

## 2023-09-30 MED FILL — Cyanocobalamin Inj 1000 MCG/ML: INTRAMUSCULAR | 30 days supply | Qty: 1 | Fill #3 | Status: CN

## 2023-09-30 MED FILL — Ibuprofen Tab 800 MG: ORAL | 10 days supply | Qty: 30 | Fill #0 | Status: AC

## 2023-09-30 MED FILL — Meclizine HCl Tab 25 MG: ORAL | 30 days supply | Qty: 30 | Fill #0 | Status: CN

## 2023-09-30 NOTE — Telephone Encounter (Signed)
 F/u scheduled on 10/05/23  Ibuprofen  last filled on 04/11/23 #30 tab/ 3 refill  Meclizine  filled on 09/09/23 #30 tab/ 0 refill  Lyric filled on 05/16/23 #60 caps/ 3 refills  Crestor  filled on 07/07/23 #90 tab/ 0 refills

## 2023-09-30 NOTE — Telephone Encounter (Signed)
 Last filled on 07/07/23 #12 tab/ 0 refills   F/u scheduled on 10/05/23

## 2023-10-01 ENCOUNTER — Other Ambulatory Visit: Payer: Self-pay

## 2023-10-05 ENCOUNTER — Ambulatory Visit: Admitting: Family Medicine

## 2023-10-05 DIAGNOSIS — E1165 Type 2 diabetes mellitus with hyperglycemia: Secondary | ICD-10-CM

## 2023-10-05 NOTE — Progress Notes (Deleted)
 Subjective:    Patient ID: Evelyn Shepherd, female    DOB: 10-Oct-1960, 63 y.o.   MRN: [id number]  HPI  Wt Readings from Last 3 Encounters:  09/01/23 228 lb 12.8 oz (103.8 kg)  08/16/23 229 lb (103.9 kg)  07/13/23 236 lb (107 kg)      There were no vitals filed for this visit.  Pt presents for follow up of  DM2    DM2 Diabetes Home sugar results   DM diet   Exercise   Zepbound  7.5 mg weekly  (last visit noted some constipation) Last visit added metformin  500 mg bid    Lab Results  Component Value Date   HGBA1C 10.0 (H) 06/20/2023   HGBA1C 7.1 (H) 03/10/2023   HGBA1C 6.0 04/05/2022   Lab Results  Component Value Date   LABMICR See below: 08/16/2023   LABMICR See below: 07/13/2023   MICROALBUR 1.1 06/20/2023    Renal protection Last eye exam      Patient Active Problem List   Diagnosis Date Noted   Anemia 06/20/2023   Other dysphagia 04/27/2023   Greater trochanteric pain syndrome of both lower extremities 03/18/2023   Lump of thigh, left 03/18/2023   Microscopic hematuria 03/18/2023   Radiculopathy 03/10/2023   OSA (obstructive sleep apnea) 03/02/2023   Vapes nicotine containing substance 02/27/2023   Chronic obstructive pulmonary disease (HCC) 02/27/2023   Sinus pressure 08/12/2021   Urinary incontinence 01/30/2021   Fibromyalgia 01/30/2021   Need for immunization against influenza 11/04/2020   Vitamin D  deficiency 05/06/2020   Hip pain 05/01/2019   Muscle cramping 05/01/2019   Bariatric surgery status 05/01/2019   Urinary frequency 11/23/2018   Screening mammogram, encounter for 11/23/2018   B12 deficiency 11/23/2018   Vertigo 07/17/2018   Former smoker 06/08/2017   History of shingles 01/22/2016   Lumbar pain 12/26/2013   Fatigue 11/07/2013   Encounter for routine gynecological examination 04/11/2013   Colon cancer screening 02/13/2013   Screening for breast cancer 04/13/2011   Special screening for malignant neoplasms, colon 04/13/2011    Hypothyroidism 08/11/2007   OBESITY, MORBID 07/14/2006   LOW BACK PAIN, CHRONIC 07/14/2006   Type 2 diabetes mellitus with hyperglycemia (HCC) 07/06/2006   Hyperlipidemia associated with type 2 diabetes mellitus (HCC) 07/06/2006   Depression 07/06/2006   Migraine without aura 07/06/2006   GERD 07/06/2006   Past Medical History:  Diagnosis Date   History of depression    History of gastroesophageal reflux (GERD)    History of hyperlipidemia    History of migraine    History of obesity    lap band surgery   History of pneumonia 2005   Past Surgical History:  Procedure Laterality Date   AUGMENTATION MAMMAPLASTY Bilateral 1991   BREAST ENHANCEMENT SURGERY     CESAREAN SECTION     DENTAL SURGERY     ESOPHAGOGASTRODUODENOSCOPY N/A 04/27/2023   Procedure: EGD (ESOPHAGOGASTRODUODENOSCOPY);  Surgeon: Therisa Bi, MD;  Location: [facility] ENDOSCOPY;  Service: Gastroenterology;  Laterality: N/A;   LAPAROSCOPIC GASTRIC BANDING  07/31/07   LUMBAR DISC SURGERY     TONGUE SURGERY  03/2005   lesion removal   Social History   Tobacco Use   Smoking status: Former    Current packs/day: 0.00    Average packs/day: 1.5 packs/day for 12.0 years (18.0 ttl pk-yrs)    Types: Cigarettes    Start date: 09/25/2005    Quit date: 09/25/2017    Years since quitting: 6.0   Smokeless tobacco: Never  Vaping Use   Vaping status: Some Days   Last attempt to quit: 03/15/2023  Substance Use Topics   Alcohol use: Yes    Alcohol/week: 0.0 standard drinks of alcohol    Comment: occasional on monthly basis   Drug use: No   Family History  Problem Relation Age of Onset   Kidney cancer Mother    Diabetes Mother    Heart attack Mother    Pneumonia Father        died   Lung cancer Father    Depression Brother        commited suicide   Alcohol abuse Brother    Breast cancer Paternal Aunt    Breast cancer Paternal Aunt    Early menopause Other        family   Colon cancer Neg Hx    Pancreatic cancer Neg  Hx    Rectal cancer Neg Hx    Stomach cancer Neg Hx    Allergies  Allergen Reactions   Codeine Itching   Meloxicam      REACTION: unspecified   Sulfonamide Derivatives     REACTION: unspecified   Current Outpatient Medications on File Prior to Visit  Medication Sig Dispense Refill   albuterol  (VENTOLIN  HFA) 108 (90 Base) MCG/ACT inhaler Inhale 2 puffs into the lungs every 6 (six) hours as needed (cough). 6.7 g 2   ciprofloxacin -dexamethasone  (CIPRODEX ) OTIC suspension Place 4 drops into the left ear 2 (two) times daily. 7.5 mL 0   cyanocobalamin  (VITAMIN B12) 1000 MCG/ML injection Inject 1 mL (1,000 mcg total) into the muscle every 30 (thirty) days. 1 mL 5   escitalopram  (LEXAPRO ) 20 MG tablet Take 1 tablet (20 mg total) by mouth daily. 90 tablet 2   fluticasone  (FLONASE ) 50 MCG/ACT nasal spray Spray two sprays in each nostril once daily 16 g 3   fluticasone -salmeterol (ADVAIR ) 250-50 MCG/ACT AEPB Inhale 1 puff into the lungs every 12 (twelve) hours. 180 each 2   ibuprofen  (ADVIL ) 800 MG tablet Take 1 tablet (800 mg total) by mouth every 8 (eight) hours as needed (with food). 30 tablet 3   ketoconazole  (NIZORAL ) 2 % cream Apply topically daily. 60 g 0   Magnesium  Citrate 125 MG CAPS Take 1 capsule by mouth in the morning and at bedtime. 180 capsule 0   meclizine  (ANTIVERT ) 25 MG tablet Take 1 tablet (25 mg total) by mouth as needed for dizziness. 30 tablet 0   metFORMIN  (GLUCOPHAGE ) 500 MG tablet Take 1 tablet (500 mg total) by mouth 2 (two) times daily with a meal. 180 tablet 1   mometasone  (ELOCON ) 0.1 % lotion Apply to itchy ears twice daily for 12 days.  May repeat as needed for itchy ears 60 mL 0   omeprazole  (PRILOSEC) 40 MG capsule Take 1 capsule (40 mg total) by mouth in the morning and at bedtime. (Patient not taking: Reported on 09/01/2023) 180 capsule 0   pregabalin  (LYRICA ) 25 MG capsule Take 1 capsule (25 mg total) by mouth 2 (two) times daily. 60 capsule 3   rosuvastatin   (CRESTOR ) 10 MG tablet Take 1 tablet (10 mg total) by mouth daily. 90 tablet 0   SUMAtriptan  (IMITREX ) 100 MG tablet Take 1 tablet (100 mg total) by mouth once as needed for up to 1 dose for headache, can repeat dose in 2 hours if needed, maximum dose of 2 tablets in one day 27 tablet 1   Syringe/Needle, Disp, (SYRINGE 3CC/21GX1) 21G X 1 3 ML MISC  Use with B12 injections 1 each 3   tamsulosin  (FLOMAX ) 0.4 MG CAPS capsule Take 1 capsule (0.4 mg total) by mouth daily. 14 capsule 0   tirzepatide  (ZEPBOUND ) 7.5 MG/0.5ML Pen Inject 7.5 mg into the skin once a week. 2 mL 2   traZODone  (DESYREL ) 50 MG tablet Take 1 tablet (50 mg total) by mouth at bedtime. 30 tablet 3   triamcinolone  cream (KENALOG ) 0.1 % Apply 1 Application topically 2 (two) times daily as needed (to affected areas). 30 g 1   Vitamin D , Ergocalciferol , (DRISDOL ) 1.25 MG (50000 UNIT) CAPS capsule Take 1 capsule (50,000 Units total) by mouth every 7 (seven) days. 12 capsule 0   No current facility-administered medications on file prior to visit.    Review of Systems     Objective:   Physical Exam        Assessment & Plan:   Problem List Items Addressed This Visit       Endocrine   Type 2 diabetes mellitus with hyperglycemia (HCC) - Primary

## 2023-10-06 ENCOUNTER — Ambulatory Visit: Payer: Self-pay | Admitting: Physician Assistant

## 2023-10-06 ENCOUNTER — Other Ambulatory Visit (INDEPENDENT_AMBULATORY_CARE_PROVIDER_SITE_OTHER): Admitting: Physician Assistant

## 2023-10-06 DIAGNOSIS — R310 Gross hematuria: Secondary | ICD-10-CM

## 2023-10-06 LAB — URINALYSIS, COMPLETE
Bilirubin, UA: NEGATIVE
Glucose, UA: NEGATIVE
Ketones, UA: NEGATIVE
Leukocytes,UA: NEGATIVE
Nitrite, UA: NEGATIVE
Protein,UA: NEGATIVE
RBC, UA: NEGATIVE
Specific Gravity, UA: 1.025 (ref 1.005–1.030)
Urobilinogen, Ur: 0.2 mg/dL (ref 0.2–1.0)
pH, UA: 6 (ref 5.0–7.5)

## 2023-10-06 LAB — MICROSCOPIC EXAMINATION

## 2023-10-06 NOTE — Progress Notes (Signed)
 No charge visit

## 2023-10-07 ENCOUNTER — Telehealth: Payer: Self-pay | Admitting: Family Medicine

## 2023-10-07 ENCOUNTER — Ambulatory Visit: Admitting: Family Medicine

## 2023-10-07 ENCOUNTER — Encounter: Payer: Self-pay | Admitting: Family Medicine

## 2023-10-07 NOTE — Telephone Encounter (Signed)
 Missed appointment letter sent to patient via my chart today

## 2023-10-07 NOTE — Telephone Encounter (Signed)
-----   Message from [facility] sent at 10/07/2023 12:23 PM EDT ----- This patient is cancelling and no showing frequently  May need a letter/warning?  Thanks

## 2023-10-21 ENCOUNTER — Other Ambulatory Visit: Payer: Self-pay

## 2023-10-21 MED ORDER — METHYLPREDNISOLONE 4 MG PO TBPK
ORAL_TABLET | ORAL | 0 refills | Status: AC
  Filled 2023-10-21: qty 21, 6d supply, fill #0

## 2023-10-28 ENCOUNTER — Ambulatory Visit (INDEPENDENT_AMBULATORY_CARE_PROVIDER_SITE_OTHER): Admitting: Family Medicine

## 2023-10-28 ENCOUNTER — Encounter: Payer: Self-pay | Admitting: Family Medicine

## 2023-10-28 VITALS — BP 106/60 | HR 85 | Temp 98.3°F | Ht 65.0 in | Wt 220.0 lb

## 2023-10-28 DIAGNOSIS — Z7985 Long-term (current) use of injectable non-insulin antidiabetic drugs: Secondary | ICD-10-CM | POA: Diagnosis not present

## 2023-10-28 DIAGNOSIS — E1165 Type 2 diabetes mellitus with hyperglycemia: Secondary | ICD-10-CM | POA: Diagnosis not present

## 2023-10-28 DIAGNOSIS — J449 Chronic obstructive pulmonary disease, unspecified: Secondary | ICD-10-CM

## 2023-10-28 DIAGNOSIS — E039 Hypothyroidism, unspecified: Secondary | ICD-10-CM

## 2023-10-28 DIAGNOSIS — E1169 Type 2 diabetes mellitus with other specified complication: Secondary | ICD-10-CM | POA: Diagnosis not present

## 2023-10-28 DIAGNOSIS — E7800 Pure hypercholesterolemia, unspecified: Secondary | ICD-10-CM

## 2023-10-28 DIAGNOSIS — Z23 Encounter for immunization: Secondary | ICD-10-CM | POA: Diagnosis not present

## 2023-10-28 DIAGNOSIS — G4733 Obstructive sleep apnea (adult) (pediatric): Secondary | ICD-10-CM

## 2023-10-28 DIAGNOSIS — D649 Anemia, unspecified: Secondary | ICD-10-CM

## 2023-10-28 DIAGNOSIS — E538 Deficiency of other specified B group vitamins: Secondary | ICD-10-CM

## 2023-10-28 DIAGNOSIS — E785 Hyperlipidemia, unspecified: Secondary | ICD-10-CM

## 2023-10-28 LAB — POCT GLYCOSYLATED HEMOGLOBIN (HGB A1C): Hemoglobin A1C: 5.9 % — AB (ref 4.0–5.6)

## 2023-10-28 NOTE — Patient Instructions (Addendum)
 Stay active  Add some strength training to your routine, this is important for bone and brain health and can reduce your risk of falls and help your body use insulin  properly and regulate weight  Light weights, exercise bands , and internet videos are a good way to start  Yoga (chair or regular), machines , floor exercises or a gym with machines are also good options    You need a diabetic eye exam  Make an appointment with your preferred eye clinic  Tell them you are diabetic    Keep eating well Keep up a good fluid intake   Schedule annual exam in 6 months (labs prior or that day)

## 2023-10-28 NOTE — Progress Notes (Unsigned)
 Subjective:    Patient ID: Evelyn Shepherd, female    DOB: [birth year], 63 y.o.   MRN: [id number]  HPI  Wt Readings from Last 3 Encounters:  10/28/23 220 lb (99.8 kg)  09/01/23 228 lb 12.8 oz (103.8 kg)  08/16/23 229 lb (103.9 kg)   36.61 kg/m  Lost from 248 in march   Vitals:   10/28/23 1557  BP: 106/60  Pulse: 85  Temp: 98.3 F (36.8 C)  SpO2: 98%     Pt presents for follow up of chronic medical problems  DM2 Hyperlipidemia  Hypothyroid   bp is stable today  No cp or palpitations or headaches or edema  No side effects to medicines  BP Readings from Last 3 Encounters:  10/28/23 106/60  09/01/23 118/60  08/16/23 110/73     Lab Results  Component Value Date   NA 137 06/20/2023   K 3.8 06/20/2023   CO2 26 06/20/2023   GLUCOSE 205 (H) 06/20/2023   BUN 27 (H) 06/20/2023   CREATININE 0.71 06/20/2023   CALCIUM  8.6 06/20/2023   GFR 90.91 06/20/2023   GFRNONAA >60 03/15/2023     DM2 Diabetes Home sugar results   DM diet - better  Both eating less and better   Occational potato chip or ice cream (very small amt)  Now fixes her food and packs her lunch-that makes a difference    Exercise  Not a lot  Walking  Working outside  [employer] to add strength training   Zepbound  7.5 mg weekly   (weight loss)  Metformin  500 mg bid   Did have prednisone  recently from orthopedics      Lab Results  Component Value Date   HGBA1C 5.9 (A) 10/28/2023   HGBA1C 10.0 (H) 06/20/2023   HGBA1C 7.1 (H) 03/10/2023   Lab Results  Component Value Date   LABMICR Comment 10/06/2023   LABMICR See below: 08/16/2023   MICROALBUR 1.1 06/20/2023    Renal protection-none/ blood pressure is low normal  Last eye exam  -needs  Needs to est with new eye doctor / has some cataracts   Hyperlipidemia Lab Results  Component Value Date   CHOL 159 06/20/2023   HDL 59.00 06/20/2023   LDLCALC 83 06/20/2023   LDLDIRECT 140.0 04/05/2022   TRIG 83.0 06/20/2023   CHOLHDL 3  06/20/2023  Rosuvastatin  10 mg daily   Hypothyroidism  Pt has no clinical changes No change in energy level/ hair or skin/ edema and no tremor Lab Results  Component Value Date   TSH 1.95 10/20/2022   No supplementation currently   History of bariatric surgery  Also on ppi  B12 def Lab Results  Component Value Date   VITAMINB12 405 06/20/2023   Last vitamin D  Lab Results  Component Value Date   VD25OH 15.63 (L) 04/05/2022   Taking her D high dose weekly      Patient Active Problem List   Diagnosis Date Noted   Long-term (current) use of injectable non-insulin  antidiabetic drugs 10/30/2023   Need for influenza vaccination 10/30/2023   Anemia 06/20/2023   Other dysphagia 04/27/2023   Greater trochanteric pain syndrome of both lower extremities 03/18/2023   Lump of thigh, left 03/18/2023   Microscopic hematuria 03/18/2023   Radiculopathy 03/10/2023   OSA (obstructive sleep apnea) 03/02/2023   Vapes nicotine containing substance 02/27/2023   Chronic obstructive pulmonary disease (HCC) 02/27/2023   Sinus pressure 08/12/2021   Urinary incontinence 01/30/2021   Fibromyalgia 01/30/2021   Need  for immunization against influenza 11/04/2020   Vitamin D  deficiency 05/06/2020   Hip pain 05/01/2019   Muscle cramping 05/01/2019   Bariatric surgery status 05/01/2019   Urinary frequency 11/23/2018   Screening mammogram, encounter for 11/23/2018   B12 deficiency 11/23/2018   Vertigo 07/17/2018   Former smoker 06/08/2017   History of shingles 01/22/2016   Lumbar pain 12/26/2013   Fatigue 11/07/2013   Encounter for routine gynecological examination 04/11/2013   Colon cancer screening 02/13/2013   Screening for breast cancer 04/13/2011   Special screening for malignant neoplasms, colon 04/13/2011   Hypothyroidism 08/11/2007   OBESITY, MORBID 07/14/2006   LOW BACK PAIN, CHRONIC 07/14/2006   Type 2 diabetes mellitus with hyperglycemia (HCC) 07/06/2006   Hyperlipidemia  associated with type 2 diabetes mellitus (HCC) 07/06/2006   Depression 07/06/2006   Migraine without aura 07/06/2006   GERD 07/06/2006   Past Medical History:  Diagnosis Date   History of depression    History of gastroesophageal reflux (GERD)    History of hyperlipidemia    History of migraine    History of obesity    lap band surgery   History of pneumonia 2005   Past Surgical History:  Procedure Laterality Date   AUGMENTATION MAMMAPLASTY Bilateral 1991   BREAST ENHANCEMENT SURGERY     CESAREAN SECTION     DENTAL SURGERY     ESOPHAGOGASTRODUODENOSCOPY N/A 04/27/2023   Procedure: EGD (ESOPHAGOGASTRODUODENOSCOPY);  Surgeon: Therisa Bi, MD;  Location: [facility] ENDOSCOPY;  Service: Gastroenterology;  Laterality: N/A;   LAPAROSCOPIC GASTRIC BANDING  07/31/07   LUMBAR DISC SURGERY     TONGUE SURGERY  03/2005   lesion removal   Social History   Tobacco Use   Smoking status: Former    Current packs/day: 0.00    Average packs/day: 1.5 packs/day for 12.0 years (18.0 ttl pk-yrs)    Types: Cigarettes    Start date: 09/25/2005    Quit date: 09/25/2017    Years since quitting: 6.0   Smokeless tobacco: Never  Vaping Use   Vaping status: Some Days   Last attempt to quit: 03/15/2023  Substance Use Topics   Alcohol use: Yes    Alcohol/week: 0.0 standard drinks of alcohol    Comment: occasional on monthly basis   Drug use: No   Family History  Problem Relation Age of Onset   Kidney cancer Mother    Diabetes Mother    Heart attack Mother    Pneumonia Father        died   Lung cancer Father    Depression Brother        commited suicide   Alcohol abuse Brother    Breast cancer Paternal Aunt    Breast cancer Paternal Aunt    Early menopause Other        family   Colon cancer Neg Hx    Pancreatic cancer Neg Hx    Rectal cancer Neg Hx    Stomach cancer Neg Hx    Allergies  Allergen Reactions   Codeine Itching   Meloxicam      REACTION: unspecified   Sulfonamide Derivatives      REACTION: unspecified   Current Outpatient Medications on File Prior to Visit  Medication Sig Dispense Refill   albuterol  (VENTOLIN  HFA) 108 (90 Base) MCG/ACT inhaler Inhale 2 puffs into the lungs every 6 (six) hours as needed (cough). 6.7 g 2   cyanocobalamin  (VITAMIN B12) 1000 MCG/ML injection Inject 1 mL (1,000 mcg total) into the muscle every  30 (thirty) days. 1 mL 5   escitalopram  (LEXAPRO ) 20 MG tablet Take 1 tablet (20 mg total) by mouth daily. 90 tablet 2   fluticasone  (FLONASE ) 50 MCG/ACT nasal spray Spray two sprays in each nostril once daily 16 g 3   fluticasone -salmeterol (ADVAIR ) 250-50 MCG/ACT AEPB Inhale 1 puff into the lungs every 12 (twelve) hours. 180 each 2   ibuprofen  (ADVIL ) 800 MG tablet Take 1 tablet (800 mg total) by mouth every 8 (eight) hours as needed (with food). 30 tablet 3   ketoconazole  (NIZORAL ) 2 % cream Apply topically daily. 60 g 0   Magnesium  Citrate 125 MG CAPS Take 1 capsule by mouth in the morning and at bedtime. 180 capsule 0   meclizine  (ANTIVERT ) 25 MG tablet Take 1 tablet (25 mg total) by mouth as needed for dizziness. 30 tablet 0   metFORMIN  (GLUCOPHAGE ) 500 MG tablet Take 1 tablet (500 mg total) by mouth 2 (two) times daily with a meal. 180 tablet 1   omeprazole  (PRILOSEC) 40 MG capsule Take 1 capsule (40 mg total) by mouth in the morning and at bedtime. 180 capsule 0   pregabalin  (LYRICA ) 25 MG capsule Take 1 capsule (25 mg total) by mouth 2 (two) times daily. 60 capsule 3   rosuvastatin  (CRESTOR ) 10 MG tablet Take 1 tablet (10 mg total) by mouth daily. 90 tablet 0   SUMAtriptan  (IMITREX ) 100 MG tablet Take 1 tablet (100 mg total) by mouth once as needed for up to 1 dose for headache, can repeat dose in 2 hours if needed, maximum dose of 2 tablets in one day 27 tablet 1   Syringe/Needle, Disp, (SYRINGE 3CC/21GX1) 21G X 1 3 ML MISC Use with B12 injections 1 each 3   tirzepatide  (ZEPBOUND ) 7.5 MG/0.5ML Pen Inject 7.5 mg into the skin once a week. 2  mL 2   traZODone  (DESYREL ) 50 MG tablet Take 1 tablet (50 mg total) by mouth at bedtime. 30 tablet 3   triamcinolone  cream (KENALOG ) 0.1 % Apply 1 Application topically 2 (two) times daily as needed (to affected areas). 30 g 1   Vitamin D , Ergocalciferol , (DRISDOL ) 1.25 MG (50000 UNIT) CAPS capsule Take 1 capsule (50,000 Units total) by mouth every 7 (seven) days. 12 capsule 0   No current facility-administered medications on file prior to visit.    Review of Systems  Constitutional:  Negative for activity change, appetite change, fatigue, fever and unexpected weight change.  HENT:  Negative for congestion, ear pain, rhinorrhea, sinus pressure and sore throat.   Eyes:  Negative for pain, redness and visual disturbance.  Respiratory:  Negative for cough, shortness of breath and wheezing.   Cardiovascular:  Negative for chest pain and palpitations.  Gastrointestinal:  Negative for abdominal pain, blood in stool, constipation and diarrhea.  Endocrine: Negative for polydipsia and polyuria.  Genitourinary:  Negative for dysuria, frequency and urgency.  Musculoskeletal:  Negative for arthralgias, back pain and myalgias.  Skin:  Negative for pallor and rash.  Allergic/Immunologic: Negative for environmental allergies.  Neurological:  Negative for dizziness, syncope and headaches.  Hematological:  Negative for adenopathy. Does not bruise/bleed easily.  Psychiatric/Behavioral:  Negative for decreased concentration and dysphoric mood. The patient is not nervous/anxious.        Objective:   Physical Exam Constitutional:      General: She is not in acute distress.    Appearance: Normal appearance. She is well-developed. She is obese. She is not ill-appearing or diaphoretic.  HENT:  Head: Normocephalic and atraumatic.  Eyes:     Conjunctiva/sclera: Conjunctivae normal.     Pupils: Pupils are equal, round, and reactive to light.  Neck:     Thyroid : No thyromegaly.     Vascular: No carotid  bruit or JVD.  Cardiovascular:     Rate and Rhythm: Normal rate and regular rhythm.     Heart sounds: Normal heart sounds.     No gallop.  Pulmonary:     Effort: Pulmonary effort is normal. No respiratory distress.     Breath sounds: Normal breath sounds. No wheezing or rales.  Abdominal:     General: There is no distension or abdominal bruit.     Palpations: Abdomen is soft.  Musculoskeletal:     Cervical back: Normal range of motion and neck supple.     Right lower leg: No edema.     Left lower leg: No edema.  Lymphadenopathy:     Cervical: No cervical adenopathy.  Skin:    General: Skin is warm and dry.     Coloration: Skin is not pale.     Findings: No rash.  Neurological:     Mental Status: She is alert.     Coordination: Coordination normal.     Deep Tendon Reflexes: Reflexes are normal and symmetric. Reflexes normal.  Psychiatric:        Mood and Affect: Mood normal.           Assessment & Plan:   Problem List Items Addressed This Visit       Endocrine   Type 2 diabetes mellitus with hyperglycemia (HCC) - Primary   Lab Results  Component Value Date   HGBA1C 5.9 (A) 10/28/2023   HGBA1C 10.0 (H) 06/20/2023   HGBA1C 7.1 (H) 03/10/2023   Much improved with zepbound  7.5 mg weekly and significant weight loss  Metformin  500 mg bid  Encouraged to add strength building exercise  Microalb utd  On a statin   Follow up 6 mo        Relevant Orders   POCT HgB A1C (Completed)   Hyperlipidemia associated with type 2 diabetes mellitus (HCC)   Disc goals for lipids and reasons to control them Rev last labs with pt LDL of 83  Suspect with better diet it will be improved at next draw (plan 6 mo visit with lab prior)  Rev low sat fat diet in detail   Rosuvastatin  10 mg daily         Other   Need for influenza vaccination   Relevant Orders   Flu vaccine trivalent PF, 6mos and older(Flulaval,Afluria,Fluarix,Fluzone) (Completed)   Long-term (current) use of  injectable non-insulin  antidiabetic drugs

## 2023-10-29 ENCOUNTER — Other Ambulatory Visit: Payer: Self-pay | Admitting: Family Medicine

## 2023-10-29 MED FILL — Fluticasone Propionate Nasal Susp 50 MCG/ACT: NASAL | 30 days supply | Qty: 16 | Fill #3 | Status: AC

## 2023-10-29 MED FILL — Meclizine HCl Tab 25 MG: ORAL | 30 days supply | Qty: 30 | Fill #0 | Status: AC

## 2023-10-29 MED FILL — Ibuprofen Tab 800 MG: ORAL | 10 days supply | Qty: 30 | Fill #1 | Status: AC

## 2023-10-29 MED FILL — Pregabalin Cap 25 MG: ORAL | 30 days supply | Qty: 60 | Fill #0 | Status: AC

## 2023-10-29 MED FILL — Rosuvastatin Calcium Tab 10 MG: ORAL | 30 days supply | Qty: 30 | Fill #0 | Status: AC

## 2023-10-29 MED FILL — Cyanocobalamin Inj 1000 MCG/ML: INTRAMUSCULAR | 30 days supply | Qty: 1 | Fill #3 | Status: AC

## 2023-10-30 ENCOUNTER — Other Ambulatory Visit: Payer: Self-pay

## 2023-10-30 DIAGNOSIS — Z7985 Long-term (current) use of injectable non-insulin antidiabetic drugs: Secondary | ICD-10-CM | POA: Insufficient documentation

## 2023-10-30 DIAGNOSIS — Z23 Encounter for immunization: Secondary | ICD-10-CM | POA: Insufficient documentation

## 2023-10-30 NOTE — Assessment & Plan Note (Signed)
 Lab Results  Component Value Date   HGBA1C 5.9 (A) 10/28/2023   HGBA1C 10.0 (H) 06/20/2023   HGBA1C 7.1 (H) 03/10/2023   Much improved with zepbound  7.5 mg weekly and significant weight loss  Metformin  500 mg bid  Encouraged to add strength building exercise  Microalb utd  On a statin   Follow up 6 mo

## 2023-10-30 NOTE — Assessment & Plan Note (Signed)
 Disc goals for lipids and reasons to control them Rev last labs with pt LDL of 83  Suspect with better diet it will be improved at next draw (plan 6 mo visit with lab prior)  Rev low sat fat diet in detail   Rosuvastatin  10 mg daily

## 2023-10-31 ENCOUNTER — Other Ambulatory Visit: Payer: Self-pay | Admitting: Family Medicine

## 2023-10-31 ENCOUNTER — Other Ambulatory Visit: Payer: Self-pay

## 2023-10-31 MED ORDER — KETOCONAZOLE 2 % EX CREA
TOPICAL_CREAM | Freq: Every day | CUTANEOUS | 1 refills | Status: AC | PRN
  Filled 2023-10-31: qty 30, 30d supply, fill #0
  Filled 2023-12-28: qty 30, 30d supply, fill #1

## 2023-10-31 NOTE — Telephone Encounter (Signed)
 Last OV was on 10/28/23, last filled on 07/15/21 # 60g/ 0 refills

## 2023-11-18 ENCOUNTER — Other Ambulatory Visit: Payer: Self-pay

## 2023-11-30 ENCOUNTER — Other Ambulatory Visit: Payer: Self-pay | Admitting: Family Medicine

## 2023-11-30 ENCOUNTER — Other Ambulatory Visit: Payer: Self-pay | Admitting: Sleep Medicine

## 2023-11-30 MED FILL — Rosuvastatin Calcium Tab 10 MG: ORAL | 30 days supply | Qty: 30 | Fill #1 | Status: AC

## 2023-11-30 MED FILL — Cyanocobalamin Inj 1000 MCG/ML: INTRAMUSCULAR | 30 days supply | Qty: 1 | Fill #4 | Status: AC

## 2023-11-30 MED FILL — Pregabalin Cap 25 MG: ORAL | 30 days supply | Qty: 60 | Fill #1 | Status: AC

## 2023-11-30 MED FILL — Ibuprofen Tab 800 MG: ORAL | 10 days supply | Qty: 30 | Fill #2 | Status: AC

## 2023-12-01 ENCOUNTER — Other Ambulatory Visit: Payer: Self-pay

## 2023-12-01 ENCOUNTER — Other Ambulatory Visit: Payer: Self-pay | Admitting: Family Medicine

## 2023-12-01 ENCOUNTER — Other Ambulatory Visit: Payer: Self-pay | Admitting: Sleep Medicine

## 2023-12-01 HISTORY — PX: EPIDURAL BLOCK INJECTION: SHX1516

## 2023-12-02 ENCOUNTER — Encounter: Payer: Self-pay | Admitting: Sleep Medicine

## 2023-12-02 ENCOUNTER — Other Ambulatory Visit: Payer: Self-pay

## 2023-12-02 ENCOUNTER — Ambulatory Visit: Admitting: Sleep Medicine

## 2023-12-02 VITALS — BP 100/60 | HR 80 | Temp 98.4°F | Ht 65.0 in | Wt 218.4 lb

## 2023-12-02 DIAGNOSIS — G4700 Insomnia, unspecified: Secondary | ICD-10-CM | POA: Diagnosis not present

## 2023-12-02 DIAGNOSIS — E669 Obesity, unspecified: Secondary | ICD-10-CM

## 2023-12-02 DIAGNOSIS — Z6836 Body mass index (BMI) 36.0-36.9, adult: Secondary | ICD-10-CM

## 2023-12-02 DIAGNOSIS — F5104 Psychophysiologic insomnia: Secondary | ICD-10-CM

## 2023-12-02 DIAGNOSIS — G4733 Obstructive sleep apnea (adult) (pediatric): Secondary | ICD-10-CM | POA: Diagnosis not present

## 2023-12-02 MED ORDER — ZEPBOUND 10 MG/0.5ML ~~LOC~~ SOAJ
10.0000 mg | SUBCUTANEOUS | 3 refills | Status: AC
  Filled 2023-12-02: qty 2, 28d supply, fill #0
  Filled 2023-12-28: qty 2, 28d supply, fill #1
  Filled 2024-01-25 – 2024-02-06 (×2): qty 2, 28d supply, fill #2

## 2023-12-02 MED FILL — Fluticasone Propionate Nasal Susp 50 MCG/ACT: NASAL | 30 days supply | Qty: 16 | Fill #0 | Status: AC

## 2023-12-02 NOTE — Progress Notes (Signed)
 "      Name:Evelyn Shepherd MRN: [id number] DOB: Aug 08, 1960   CHIEF COMPLAINT:  CPAP F/U   HISTORY OF PRESENT ILLNESS:  Evelyn Shepherd is a 63 y.o. w/ a h/o OSA, DMII, anxiety and obesity who presents for CPAP f/u visit. Reports using CPAP therapy every night, which is confirmed by compliance data. She is currently using the Airfit F40 FFM, which is comfortable. Reports occasional air leaks.  Reports weight loss since starting on Zepbound , denies any side effects.    EPWORTH SLEEP SCORE    03/08/2023    8:00 AM  Results of the Epworth flowsheet  Sitting and reading 3  Watching TV 2  Sitting, inactive in a public place (e.g. a theatre or a meeting) 2  As a passenger in a car for an hour without a break 3  Lying down to rest in the afternoon when circumstances permit 3  Sitting and talking to someone 1  Sitting quietly after a lunch without alcohol 2  In a car, while stopped for a few minutes in traffic 2  Total score 18    PAST MEDICAL HISTORY :   has a past medical history of History of depression, History of gastroesophageal reflux (GERD), History of hyperlipidemia, History of migraine, History of obesity, and History of pneumonia (2005).  has a past surgical history that includes Cesarean section; Breast enhancement surgery; Lumbar disc surgery; Dental surgery; Tongue surgery (03/2005); Laparoscopic gastric banding (07/31/2007); Augmentation mammaplasty (Bilateral, 1991); Esophagogastroduodenoscopy (N/A, 04/27/2023); and Epidural block injection (12/01/2023). Prior to Admission medications   Medication Sig Start Date End Date Taking? Authorizing Provider  albuterol  (VENTOLIN  HFA) 108 (90 Base) MCG/ACT inhaler Inhale 2 puffs into the lungs every 6 (six) hours as needed (cough). 06/19/23  Yes White, Adrienne R, NP  ciprofloxacin -dexamethasone  (CIPRODEX ) OTIC suspension Place 4 drops into the left ear 2 (two) times daily. 06/03/23  Yes White, Adrienne R, NP  cyanocobalamin  (VITAMIN B12)  1000 MCG/ML injection Inject 1 mL (1,000 mcg total) into the muscle every 30 (thirty) days. 06/20/23  Yes Tower, Laine LABOR, MD  escitalopram  (LEXAPRO ) 20 MG tablet Take 1 tablet (20 mg total) by mouth daily. 08/13/22  Yes Tower, Laine LABOR, MD  fluticasone  (FLONASE ) 50 MCG/ACT nasal spray Spray two sprays in each nostril once daily 07/07/23  Yes Tower, Laine LABOR, MD  fluticasone -salmeterol (ADVAIR ) 250-50 MCG/ACT AEPB Inhale 1 puff into the lungs every 12 (twelve) hours. 06/21/23  Yes Aleskerov, Fuad, MD  ibuprofen  (ADVIL ) 800 MG tablet Take 1 tablet (800 mg total) by mouth every 8 (eight) hours as needed (with food). 04/11/23  Yes Tower, Laine LABOR, MD  ketoconazole  (NIZORAL ) 2 % cream Apply topically daily. 07/15/21  Yes Tower, Laine LABOR, MD  Magnesium  Citrate 125 MG CAPS Take 1 capsule by mouth in the morning and at bedtime. 07/11/23  Yes Tower, Laine LABOR, MD  meclizine  (ANTIVERT ) 25 MG tablet Take 1 tablet (25 mg total) by mouth as needed for dizziness. 07/15/21  Yes Tower, Laine LABOR, MD  metFORMIN  (GLUCOPHAGE ) 500 MG tablet Take 1 tablet (500 mg total) by mouth 2 (two) times daily with a meal. 06/20/23  Yes Tower, Laine LABOR, MD  mometasone  (ELOCON ) 0.1 % lotion Apply to itchy ears twice daily for 12 days.  May repeat as needed for itchy ears 07/07/23  Yes Tower, Laine LABOR, MD  pregabalin  (LYRICA ) 25 MG capsule Take 1 capsule (25 mg total) by mouth 2 (two) times daily. 05/16/23  Yes Tower, Kb [place],  MD  rosuvastatin  (CRESTOR ) 10 MG tablet Take 1 tablet (10 mg total) by mouth daily. 07/07/23  Yes Tower, Laine LABOR, MD  SUMAtriptan  (IMITREX ) 100 MG tablet Take 1 tablet (100 mg total) by mouth once as needed for up to 1 dose for headache, can repeat dose in 2 hours if needed, maximum dose of 2 tablets in one day 07/07/23  Yes Tower, Laine LABOR, MD  Syringe/Needle, Disp, (SYRINGE 3CC/21GX1) 21G X 1 3 ML MISC Use with B12 injections 07/07/23  Yes Tower, Laine LABOR, MD  tamsulosin  (FLOMAX ) 0.4 MG CAPS capsule Take 1 capsule (0.4 mg total) by mouth  daily. 08/16/23  Yes Stoioff, Glendia BROCKS, MD  tirzepatide  (ZEPBOUND ) 5 MG/0.5ML Pen Inject 5 mg into the skin once a week. 07/12/23  Yes Charls Custer D, MD  triamcinolone  cream (KENALOG ) 0.1 % Apply 1 Application topically 2 (two) times daily as needed (to affected areas). 12/28/22  Yes Tower, Laine LABOR, MD  Vitamin D , Ergocalciferol , (DRISDOL ) 1.25 MG (50000 UNIT) CAPS capsule Take 1 capsule (50,000 Units total) by mouth every 7 (seven) days. 07/07/23  Yes Tower, Laine LABOR, MD  omeprazole  (PRILOSEC) 40 MG capsule Take 1 capsule (40 mg total) by mouth in the morning and at bedtime. Patient not taking: Reported on 09/01/2023 04/27/23   Therisa Bi, MD   Allergies  Allergen Reactions   Codeine Itching   Meloxicam      REACTION: unspecified   Sulfonamide Derivatives     REACTION: unspecified    FAMILY HISTORY:  family history includes Alcohol abuse in her brother; Breast cancer in her paternal aunt and paternal aunt; Depression in her brother; Diabetes in her mother; Early menopause in an other family member; Heart attack in her mother; Kidney cancer in her mother; Lung cancer in her father; Pneumonia in her father. SOCIAL HISTORY:  reports that she quit smoking about 6 years ago. Her smoking use included cigarettes. She started smoking about 18 years ago. She has a 18 pack-year smoking history. She has never used smokeless tobacco. She reports current alcohol use. She reports that she does not use drugs.   Review of Systems:  Gen:  Denies  fever, sweats, chills weight loss  HEENT: Denies blurred vision, double vision, ear pain, eye pain, hearing loss, nose bleeds, sore throat Cardiac:  No dizziness, chest pain or heaviness, chest tightness,edema, No JVD Resp:   No cough, -sputum production, -shortness of breath,-wheezing, -hemoptysis,  Gi: Denies swallowing difficulty, stomach pain, nausea or vomiting, diarrhea, constipation, bowel incontinence Gu:  Denies bladder incontinence, burning urine Ext:    Denies Joint pain, stiffness or swelling Skin: Denies  skin rash, easy bruising or bleeding or hives Endoc:  Denies polyuria, polydipsia , polyphagia or weight change Psych:   Denies depression, insomnia or hallucinations  Other:  All other systems negative  VITAL SIGNS: BP 100/60   Pulse 80   Temp 98.4 F (36.9 C)   Ht 5' 5 (1.651 m)   Wt 218 lb 6.4 oz (99.1 kg)   SpO2 96%   BMI 36.34 kg/m    Physical Examination:   General Appearance: No distress  EYES PERRLA, EOM intact.   NECK Supple, No JVD Pulmonary: normal breath sounds, No wheezing.  CardiovascularNormal S1,S2.  No m/r/g.   Abdomen: Benign, Soft, non-tender. Skin:   warm, no rashes, no ecchymosis  Extremities: normal, no cyanosis, clubbing. Neuro:without focal findings,  speech normal  PSYCHIATRIC: Mood, affect within normal limits.   ASSESSMENT AND PLAN  OSA Patient  is using and benefiting from CPAP therapy. Discussed the consequences of untreated sleep apnea. Advised not to drive drowsy for safety of patient and others. Will follow up in 3 months.     Obesity Increasing Zepbound  dose to 10 mg weekly.   Insomnia Improved since last visit.    Patient  satisfied with Plan of action and management. All questions answered  I spent a total of 23 minutes reviewing chart data, face-to-face evaluation with the patient, counseling and coordination of care as detailed above.    Arletta Lumadue, M.D.  Sleep Medicine [facility]        "

## 2023-12-02 NOTE — Patient Instructions (Addendum)

## 2023-12-05 ENCOUNTER — Other Ambulatory Visit: Payer: Self-pay

## 2023-12-28 ENCOUNTER — Other Ambulatory Visit: Payer: Self-pay | Admitting: Sleep Medicine

## 2023-12-28 ENCOUNTER — Other Ambulatory Visit: Payer: Self-pay | Admitting: Family Medicine

## 2023-12-28 DIAGNOSIS — F5104 Psychophysiologic insomnia: Secondary | ICD-10-CM

## 2023-12-28 DIAGNOSIS — F419 Anxiety disorder, unspecified: Secondary | ICD-10-CM

## 2023-12-28 MED FILL — Rosuvastatin Calcium Tab 10 MG: ORAL | 30 days supply | Qty: 30 | Fill #2 | Status: AC

## 2023-12-28 MED FILL — Ibuprofen Tab 800 MG: ORAL | 10 days supply | Qty: 30 | Fill #3 | Status: AC

## 2023-12-28 MED FILL — Cyanocobalamin Inj 1000 MCG/ML: INTRAMUSCULAR | 30 days supply | Qty: 1 | Fill #5 | Status: AC

## 2023-12-28 MED FILL — Pregabalin Cap 25 MG: ORAL | 30 days supply | Qty: 60 | Fill #2 | Status: AC

## 2023-12-29 ENCOUNTER — Other Ambulatory Visit: Payer: Self-pay

## 2023-12-29 MED ORDER — TRIAMCINOLONE ACETONIDE 0.1 % EX CREA
1.0000 | TOPICAL_CREAM | Freq: Two times a day (BID) | CUTANEOUS | 1 refills | Status: AC | PRN
  Filled 2023-12-29: qty 30, 15d supply, fill #0

## 2023-12-29 MED ORDER — TRAZODONE HCL 50 MG PO TABS
50.0000 mg | ORAL_TABLET | Freq: Every day | ORAL | 11 refills | Status: AC
  Filled 2023-12-29: qty 30, 30d supply, fill #0
  Filled 2024-01-25: qty 30, 30d supply, fill #1

## 2023-12-29 MED ORDER — MECLIZINE HCL 25 MG PO TABS
25.0000 mg | ORAL_TABLET | ORAL | 0 refills | Status: AC | PRN
  Filled 2023-12-29: qty 30, 30d supply, fill #0

## 2023-12-29 NOTE — Telephone Encounter (Signed)
 CPE scheduled 04/26/24  Kenalog  last filled 12/28/22 #30 g/ 1 refill   Meclizine  last filled on 09/30/23 #30 tab/ 0 refill

## 2024-01-15 ENCOUNTER — Ambulatory Visit
Admission: RE | Admit: 2024-01-15 | Discharge: 2024-01-15 | Disposition: A | Discharge status: Home / Self Care | Attending: Emergency Medicine | Admitting: Emergency Medicine

## 2024-01-15 VITALS — BP 94/60 | HR 72 | Temp 98.1°F | Resp 17 | Wt 218.0 lb

## 2024-01-15 DIAGNOSIS — H66003 Acute suppurative otitis media without spontaneous rupture of ear drum, bilateral: Secondary | ICD-10-CM | POA: Diagnosis not present

## 2024-01-15 DIAGNOSIS — J22 Unspecified acute lower respiratory infection: Secondary | ICD-10-CM | POA: Diagnosis not present

## 2024-01-15 DIAGNOSIS — Z720 Tobacco use: Secondary | ICD-10-CM

## 2024-01-15 DIAGNOSIS — T50905A Adverse effect of unspecified drugs, medicaments and biological substances, initial encounter: Secondary | ICD-10-CM

## 2024-01-15 MED ORDER — BENZONATATE 100 MG PO CAPS: 100.0000 mg | ORAL_CAPSULE | Freq: Three times a day (TID) | ORAL | 0 refills | Status: AC

## 2024-01-15 MED ORDER — AMOXICILLIN 875 MG PO TABS: 875.0000 mg | ORAL_TABLET | Freq: Two times a day (BID) | ORAL | 0 refills | Status: AC

## 2024-01-15 NOTE — ED Provider Notes (Signed)
 " [facility]    CSN: [id number] Arrival date & time: 01/15/24  1023      History   Chief Complaint Chief Complaint  Patient presents with   Nasal Congestion    Also, coughing and earache for approx. 2 weeks - Entered by patient   Cough    HPI Evelyn Shepherd is a 64 y.o. female.   Evelyn Shepherd, 64 year old female, presents to urgent care for evaluation of nasal congestion, cough and earache for 2 weeks. Pt reports she is drinking plenty of fluids. Pt has soft BM daily after eating. Pt is taking metformin  and zepbound . Pt endorses vape use.   The history is provided by the patient. No language interpreter was used.    Past Medical History:  Diagnosis Date   History of depression    History of gastroesophageal reflux (GERD)    History of hyperlipidemia    History of migraine    History of obesity    lap band surgery   History of pneumonia 2005    Patient Active Problem List   Diagnosis Date Noted   Acute respiratory infection 01/15/2024   Acute suppurative otitis media of both ears without spontaneous rupture of tympanic membranes 01/15/2024   Medication side effect, initial encounter 01/15/2024   Long-term (current) use of injectable non-insulin  antidiabetic drugs 10/30/2023   Need for influenza vaccination 10/30/2023   Anemia 06/20/2023   Other dysphagia 04/27/2023   Greater trochanteric pain syndrome of both lower extremities 03/18/2023   Lump of thigh, left 03/18/2023   Microscopic hematuria 03/18/2023   Radiculopathy 03/10/2023   OSA (obstructive sleep apnea) 03/02/2023   Vapes nicotine containing substance 02/27/2023   Chronic obstructive pulmonary disease (HCC) 02/27/2023   Sinus pressure 08/12/2021   Urinary incontinence 01/30/2021   Fibromyalgia 01/30/2021   Need for immunization against influenza 11/04/2020   Vitamin D  deficiency 05/06/2020   Hip pain 05/01/2019   Muscle cramping 05/01/2019   Bariatric surgery status 05/01/2019   Urinary  frequency 11/23/2018   Screening mammogram, encounter for 11/23/2018   B12 deficiency 11/23/2018   Vertigo 07/17/2018   Former smoker 06/08/2017   History of shingles 01/22/2016   Lumbar pain 12/26/2013   Fatigue 11/07/2013   Encounter for routine gynecological examination 04/11/2013   Colon cancer screening 02/13/2013   Screening for breast cancer 04/13/2011   Special screening for malignant neoplasms, colon 04/13/2011   Hypothyroidism 08/11/2007   OBESITY, MORBID 07/14/2006   LOW BACK PAIN, CHRONIC 07/14/2006   Type 2 diabetes mellitus with hyperglycemia (HCC) 07/06/2006   Hyperlipidemia associated with type 2 diabetes mellitus (HCC) 07/06/2006   Depression 07/06/2006   Migraine without aura 07/06/2006   GERD 07/06/2006    Past Surgical History:  Procedure Laterality Date   AUGMENTATION MAMMAPLASTY Bilateral 1991   BREAST ENHANCEMENT SURGERY     CESAREAN SECTION     DENTAL SURGERY     EPIDURAL BLOCK INJECTION  12/01/2023   lower back   ESOPHAGOGASTRODUODENOSCOPY N/A 04/27/2023   Procedure: EGD (ESOPHAGOGASTRODUODENOSCOPY);  Surgeon: Therisa Bi, MD;  Location: [facility] ENDOSCOPY;  Service: Gastroenterology;  Laterality: N/A;   LAPAROSCOPIC GASTRIC BANDING  07/31/2007   LUMBAR DISC SURGERY     TONGUE SURGERY  03/2005   lesion removal    OB History   No obstetric history on file.      Home Medications    Prior to Admission medications  Medication Sig Start Date End Date Taking? Authorizing Provider  amoxicillin  (AMOXIL ) 875  MG tablet Take 1 tablet (875 mg total) by mouth 2 (two) times daily for 7 days. 01/15/24 01/22/24 Yes Rifky Lapre, NP  benzonatate  (TESSALON ) 100 MG capsule Take 1 capsule (100 mg total) by mouth every 8 (eight) hours. 01/15/24  Yes Jazae Gandolfi, Rilla, NP  cyanocobalamin  (VITAMIN B12) 1000 MCG/ML injection Inject 1 mL (1,000 mcg total) into the muscle every 30 (thirty) days. 06/20/23  Yes Tower, Laine LABOR, MD  escitalopram  (LEXAPRO ) 20 MG tablet Take 1  tablet (20 mg total) by mouth daily. 09/09/23  Yes Tower, Laine LABOR, MD  METFORMIN  & DIET MANAGE PROD PO    Yes [provider]  omeprazole  (PRILOSEC) 40 MG capsule Take 1 capsule (40 mg total) by mouth in the morning and at bedtime. 04/27/23  Yes Therisa Bi, MD  pregabalin  (LYRICA ) 25 MG capsule Take 1 capsule (25 mg total) by mouth 2 (two) times daily. 09/30/23  Yes Tower, Laine LABOR, MD  rosuvastatin  (CRESTOR ) 10 MG tablet Take 1 tablet (10 mg total) by mouth daily. 09/30/23  Yes Tower, Laine LABOR, MD  SUMAtriptan  (IMITREX ) 100 MG tablet Take 1 tablet (100 mg total) by mouth once as needed for up to 1 dose for headache, can repeat dose in 2 hours if needed, maximum dose of 2 tablets in one day 07/07/23  Yes Tower, Laine LABOR, MD  tirzepatide  (ZEPBOUND ) 10 MG/0.5ML Pen Inject 10 mg into the skin once a week. 12/02/23  Yes Reddy, Pallavi D, MD  traZODone  (DESYREL ) 50 MG tablet Take 1 tablet (50 mg total) by mouth at bedtime. 12/29/23  Yes Reddy, Pallavi D, MD  Vitamin D , Ergocalciferol , (DRISDOL ) 1.25 MG (50000 UNIT) CAPS capsule Take 1 capsule (50,000 Units total) by mouth every 7 (seven) days. 09/30/23  Yes Tower, Laine LABOR, MD  albuterol  (VENTOLIN  HFA) 108 (90 Base) MCG/ACT inhaler Inhale 2 puffs into the lungs every 6 (six) hours as needed (cough). 06/19/23   Teresa Shelba SAUNDERS, NP  fluticasone  (FLONASE ) 50 MCG/ACT nasal spray Place 2 sprays into both nostrils daily. 12/02/23   Tower, Laine LABOR, MD  fluticasone -salmeterol (ADVAIR ) 250-50 MCG/ACT AEPB Inhale 1 puff into the lungs every 12 (twelve) hours. 06/21/23   Aleskerov, Fuad, MD  ibuprofen  (ADVIL ) 800 MG tablet Take 1 tablet (800 mg total) by mouth every 8 (eight) hours as needed (with food). 09/30/23   Tower, Laine LABOR, MD  ketoconazole  (NIZORAL ) 2 % cream Apply topically daily as needed for irritation. 10/31/23   Tower, Laine LABOR, MD  Magnesium  Citrate 125 MG CAPS Take 1 capsule by mouth in the morning and at bedtime. 07/11/23   Tower, Laine LABOR, MD  meclizine   (ANTIVERT ) 25 MG tablet Take 1 tablet (25 mg total) by mouth as needed for dizziness. 12/29/23   Tower, Laine LABOR, MD  metFORMIN  (GLUCOPHAGE ) 500 MG tablet Take 1 tablet (500 mg total) by mouth 2 (two) times daily with a meal. 06/20/23   Tower, Laine LABOR, MD  Syringe/Needle, Disp, (SYRINGE 3CC/25GX1) 25G X 1 3 ML MISC use with B12 injection 07/07/23   Tower, Laine LABOR, MD  triamcinolone  cream (KENALOG ) 0.1 % Apply 1 Application topically 2 (two) times daily as needed (to affected areas). 12/29/23   Tower, Laine LABOR, MD    Family History Family History  Problem Relation Age of Onset   Kidney cancer Mother    Diabetes Mother    Heart attack Mother    Pneumonia Father        died   Lung cancer Father  Depression Brother        commited suicide   Alcohol abuse Brother    Breast cancer Paternal Aunt    Breast cancer Paternal Aunt    Early menopause Other        family   Colon cancer Neg Hx    Pancreatic cancer Neg Hx    Rectal cancer Neg Hx    Stomach cancer Neg Hx     Social History Social History[1]   Allergies   Codeine, Meloxicam , and Sulfonamide derivatives   Review of Systems Review of Systems  Constitutional:  Negative for fever.  HENT:  Positive for sinus pressure, sinus pain and sneezing.   Respiratory:  Positive for cough.   Gastrointestinal:  Positive for diarrhea. Negative for abdominal pain, blood in stool, nausea and vomiting.  All other systems reviewed and are negative.    Physical Exam Triage Vital Signs ED Triage Vitals  Encounter Vitals Group     BP      Girls Systolic BP Percentile      Girls Diastolic BP Percentile      Boys Systolic BP Percentile      Boys Diastolic BP Percentile      Pulse      Resp      Temp      Temp src      SpO2      Weight      Height      Head Circumference      Peak Flow      Pain Score      Pain Loc      Pain Education      Exclude from Growth Chart    No data found.  Updated Vital Signs BP (!) 86/63 (BP  Location: Left Arm) Comment: pt aware states low bp is normal  Pulse 72   Temp 98.1 F (36.7 C) (Oral)   Resp 17   Wt 218 lb (98.9 kg)   SpO2 98%   BMI 36.28 kg/m   Visual Acuity Right Eye Distance:   Left Eye Distance:   Bilateral Distance:    Right Eye Near:   Left Eye Near:    Bilateral Near:     Physical Exam Vitals and nursing note reviewed.  Constitutional:      General: She is not in acute distress.    Appearance: She is well-developed and well-groomed.  HENT:     Head: Normocephalic.     Right Ear: Tympanic membrane is retracted.     Left Ear: Tympanic membrane is retracted.     Nose: Mucosal edema and congestion present.     Right Sinus: Maxillary sinus tenderness present.     Left Sinus: Maxillary sinus tenderness present.     Mouth/Throat:     Lips: Pink.     Mouth: Mucous membranes are moist.     Pharynx: Oropharynx is clear. Uvula midline. Postnasal drip present.  Eyes:     General: Lids are normal.     Conjunctiva/sclera: Conjunctivae normal.     Pupils: Pupils are equal, round, and reactive to light.  Neck:     Trachea: No tracheal deviation.  Cardiovascular:     Rate and Rhythm: Normal rate and regular rhythm.     Heart sounds: Normal heart sounds. No murmur heard. Pulmonary:     Effort: Pulmonary effort is normal.     Breath sounds: Normal breath sounds and air entry.  Abdominal:     General: Bowel sounds  are normal.     Palpations: Abdomen is soft.     Tenderness: There is no abdominal tenderness.  Musculoskeletal:        General: Normal range of motion.     Cervical back: Normal range of motion.  Lymphadenopathy:     Cervical: No cervical adenopathy.  Skin:    General: Skin is warm and dry.     Findings: No rash.  Neurological:     General: No focal deficit present.     Mental Status: She is alert and oriented to person, place, and time.     GCS: GCS eye subscore is 4. GCS verbal subscore is 5. GCS motor subscore is 6.  Psychiatric:         Attention and Perception: Attention normal.        Mood and Affect: Mood normal.        Speech: Speech normal.        Behavior: Behavior normal. Behavior is cooperative.      UC Treatments / Results  Labs (all labs ordered are listed, but only abnormal results are displayed) Labs Reviewed - No data to display  EKG   Radiology No results found.  Procedures Procedures (including critical care time)  Medications Ordered in UC Medications - No data to display  Initial Impression / Assessment and Plan / UC Course  I have reviewed the triage vital signs and the nursing notes.  Pertinent labs & imaging results that were available during my care of the patient were reviewed by me and considered in my medical decision making (see chart for details).    Discussed exam findings and plan of care with pt: Rest,push fluids, take amoxicillin  and tessalon  as prescribed.  Discussed Zepbound  and metformin  use with provider that this may be causing some of your symptoms(side effect of meds),  if you develop fever, abdominal pain or worsening symptoms, go to Er for further evaluation.  Stop vaping.  Patient verbalized understanding to this provider.    Ddx: Acute respiratory infection, Acute bilateral OM, allergies, viral illness, medication side effect(diarrhea,loose bowel from Zebpound/metformin ) Final Clinical Impressions(s) / UC Diagnoses   Final diagnoses:  Acute respiratory infection  Acute suppurative otitis media of both ears without spontaneous rupture of tympanic membranes, recurrence not specified  Medication side effect, initial encounter  Vapes nicotine containing substance     Discharge Instructions      Rest,push fluids, take amoxicillin  and tessalon  as prescribed. If you develop fever, abdominal pain or worsening symptoms, go to Er for further evaluation.  Stop vaping     ED Prescriptions     Medication Sig Dispense Auth. Provider   amoxicillin  (AMOXIL ) 875  MG tablet Take 1 tablet (875 mg total) by mouth 2 (two) times daily for 7 days. 14 tablet Tocarra Gassen, NP   benzonatate  (TESSALON ) 100 MG capsule Take 1 capsule (100 mg total) by mouth every 8 (eight) hours. 21 capsule Allysia Ingles, NP      PDMP not reviewed this encounter.     [1]  Social History Tobacco Use   Smoking status: Former    Current packs/day: 0.00    Average packs/day: 1.5 packs/day for 12.0 years (18.0 ttl pk-yrs)    Types: Cigarettes    Start date: 09/25/2005    Quit date: 09/25/2017    Years since quitting: 6.3   Smokeless tobacco: Never  Vaping Use   Vaping status: Some Days   Last attempt to quit: 03/15/2023  Substance Use Topics  Alcohol use: Yes    Alcohol/week: 0.0 standard drinks of alcohol    Comment: occasional on monthly basis   Drug use: No     Asante Ritacco, Rilla, NP 01/15/24 1117  "

## 2024-01-15 NOTE — ED Triage Notes (Signed)
 Patient states that she's had her sx x 2 weeks  Cough Sneezing Sinus pressure  Diarrhea x 1 month

## 2024-01-15 NOTE — Discharge Instructions (Addendum)
 Rest,push fluids, take amoxicillin  and tessalon  as prescribed. If you develop fever, abdominal pain or worsening symptoms, go to Er for further evaluation.  Stop vaping

## 2024-01-16 ENCOUNTER — Telehealth: Payer: Self-pay

## 2024-01-16 DIAGNOSIS — G4733 Obstructive sleep apnea (adult) (pediatric): Secondary | ICD-10-CM

## 2024-01-16 DIAGNOSIS — F5104 Psychophysiologic insomnia: Secondary | ICD-10-CM

## 2024-01-16 NOTE — Telephone Encounter (Signed)
 Copied from CRM #[id number]. Topic: Clinical - Order For Equipment >> Jan 13, 2024  3:28 PM Leila BROCKS wrote: Reason for CRM: Patient [phone] states Dr. Jess gave patient a different mask for cpap machine during office visit 12/02/23, there's a velcro strap and the tubing across the upper lip and underneath nasal holes. Patient got her cpap supplies from [employer] and the old mask was sent, patient wants the cpap mask Dr. Jess provided patient, please send order. Patient does not have the DME phone number. Please advise and call back.

## 2024-01-20 NOTE — Telephone Encounter (Signed)
 Spoke with pt to try and clarify which type of mask she has, we have determined it is a nasal pillow style and she just needs an updated supplies order request, but will follow up once she is home to confirm the name of her mask via MyChart.

## 2024-01-25 ENCOUNTER — Other Ambulatory Visit: Payer: Self-pay | Admitting: Family Medicine

## 2024-01-25 MED FILL — Fluticasone Propionate Nasal Susp 50 MCG/ACT: NASAL | 30 days supply | Qty: 16 | Fill #1 | Status: AC

## 2024-01-25 MED FILL — Pregabalin Cap 25 MG: ORAL | 30 days supply | Qty: 60 | Fill #3 | Status: CN

## 2024-01-26 ENCOUNTER — Other Ambulatory Visit: Payer: Self-pay

## 2024-01-26 MED ORDER — ROSUVASTATIN CALCIUM 10 MG PO TABS
10.0000 mg | ORAL_TABLET | Freq: Every day | ORAL | 0 refills | Status: AC
  Filled 2024-01-26: qty 30, 30d supply, fill #0

## 2024-01-26 MED ORDER — SUMATRIPTAN SUCCINATE 100 MG PO TABS
ORAL_TABLET | ORAL | 1 refills | Status: AC
  Filled 2024-01-26: qty 9, 15d supply, fill #0
  Filled 2024-02-06: qty 9, 15d supply, fill #1

## 2024-01-26 MED ORDER — CYANOCOBALAMIN 1000 MCG/ML IJ SOLN
1000.0000 ug | INTRAMUSCULAR | 2 refills | Status: AC
  Filled 2024-01-26: qty 1, 30d supply, fill #0

## 2024-01-26 MED ORDER — METFORMIN HCL 500 MG PO TABS
500.0000 mg | ORAL_TABLET | Freq: Two times a day (BID) | ORAL | 0 refills | Status: AC
  Filled 2024-01-26: qty 60, 30d supply, fill #0

## 2024-01-26 MED ORDER — VITAMIN D (ERGOCALCIFEROL) 1.25 MG (50000 UNIT) PO CAPS
50000.0000 [IU] | ORAL_CAPSULE | ORAL | 0 refills | Status: AC
  Filled 2024-01-26: qty 4, 28d supply, fill #0

## 2024-01-26 MED ORDER — IBUPROFEN 800 MG PO TABS
800.0000 mg | ORAL_TABLET | Freq: Three times a day (TID) | ORAL | 3 refills | Status: AC | PRN
  Filled 2024-01-26: qty 30, 10d supply, fill #0

## 2024-01-26 NOTE — Telephone Encounter (Signed)
 F/u scheduled 04/27/23   Vit D Rx last filled on 09/30/23 #12 caps/ 0 refill  Imitrex  last filled on 07/07/23 #27 tab/ 1 refill  Ibuprofen  last filled on 09/30/23 #30 tab/ 3 refills

## 2024-01-27 MED FILL — Pregabalin Cap 25 MG: ORAL | 30 days supply | Qty: 60 | Fill #3 | Status: AC

## 2024-02-07 ENCOUNTER — Other Ambulatory Visit: Payer: Self-pay

## 2024-02-16 ENCOUNTER — Telehealth: Payer: Self-pay | Admitting: *Deleted

## 2024-02-16 NOTE — Telephone Encounter (Signed)
 Copied from CRM #[id number]. Topic: Clinical - Prescription Issue >> Feb 16, 2024  9:01 AM Viola F wrote: Reason for CRM: Patient called to follow up on prior authorization for the Zepbound  medication - she originally requested PA to be submitted 02/09/24. Please call her with an update at [phone]

## 2024-03-05 ENCOUNTER — Ambulatory Visit: Admitting: Sleep Medicine

## 2024-04-19 ENCOUNTER — Other Ambulatory Visit

## 2024-04-26 ENCOUNTER — Ambulatory Visit: Admitting: Family Medicine
